# Patient Record
Sex: Male | Born: 1945 | Race: Black or African American | Hispanic: No | Smoking: Former smoker
Health system: Southern US, Community
[De-identification: ages and names within clinical notes are randomized; demographics above are authoritative.]

## PROBLEM LIST (undated history)

## (undated) DIAGNOSIS — C9000 Multiple myeloma not having achieved remission: Secondary | ICD-10-CM

## (undated) DIAGNOSIS — E119 Type 2 diabetes mellitus without complications: Secondary | ICD-10-CM

## (undated) DIAGNOSIS — E213 Hyperparathyroidism, unspecified: Secondary | ICD-10-CM

## (undated) DIAGNOSIS — K275 Chronic or unspecified peptic ulcer, site unspecified, with perforation: Secondary | ICD-10-CM

## (undated) DIAGNOSIS — Z992 Dependence on renal dialysis: Secondary | ICD-10-CM

## (undated) DIAGNOSIS — I509 Heart failure, unspecified: Secondary | ICD-10-CM

## (undated) DIAGNOSIS — I714 Abdominal aortic aneurysm, without rupture, unspecified: Secondary | ICD-10-CM

## (undated) DIAGNOSIS — I10 Essential (primary) hypertension: Secondary | ICD-10-CM

## (undated) DIAGNOSIS — I739 Peripheral vascular disease, unspecified: Secondary | ICD-10-CM

## (undated) HISTORY — PX: OTHER SURGICAL HISTORY: SHX169

## (undated) HISTORY — PX: PACEMAKER PLACEMENT: SHX43

## (undated) HISTORY — PX: BELOW KNEE LEG AMPUTATION: SUR23

## (undated) HISTORY — PX: AV FISTULA PLACEMENT: SHX1204

---

## 2015-11-25 ENCOUNTER — Other Ambulatory Visit
Admission: RE | Admit: 2015-11-25 | Discharge: 2015-11-25 | Disposition: A | Discharge status: Home / Self Care | Payer: Medicare Other | Source: Other Acute Inpatient Hospital | Attending: Family Medicine | Admitting: Family Medicine

## 2015-11-25 DIAGNOSIS — E785 Hyperlipidemia, unspecified: Secondary | ICD-10-CM | POA: Diagnosis not present

## 2015-11-25 DIAGNOSIS — E119 Type 2 diabetes mellitus without complications: Secondary | ICD-10-CM | POA: Insufficient documentation

## 2015-11-25 DIAGNOSIS — R0602 Shortness of breath: Secondary | ICD-10-CM | POA: Diagnosis not present

## 2015-11-25 DIAGNOSIS — N186 End stage renal disease: Secondary | ICD-10-CM | POA: Insufficient documentation

## 2015-11-25 DIAGNOSIS — I714 Abdominal aortic aneurysm, without rupture: Secondary | ICD-10-CM | POA: Insufficient documentation

## 2015-11-25 DIAGNOSIS — K271 Acute peptic ulcer, site unspecified, with perforation: Secondary | ICD-10-CM | POA: Diagnosis present

## 2015-11-25 DIAGNOSIS — I10 Essential (primary) hypertension: Secondary | ICD-10-CM | POA: Insufficient documentation

## 2015-11-25 LAB — CBC WITH DIFFERENTIAL/PLATELET
BASOS ABS: 0 10*3/uL (ref 0–0.1)
BASOS PCT: 1 %
EOS ABS: 0 10*3/uL (ref 0–0.7)
Eosinophils Relative: 0 %
HEMATOCRIT: 24.7 % — AB (ref 40.0–52.0)
HEMOGLOBIN: 8 g/dL — AB (ref 13.0–18.0)
Lymphocytes Relative: 10 %
Lymphs Abs: 0.9 10*3/uL — ABNORMAL LOW (ref 1.0–3.6)
MCH: 26 pg (ref 26.0–34.0)
MCHC: 32.6 g/dL (ref 32.0–36.0)
MCV: 79.9 fL — ABNORMAL LOW (ref 80.0–100.0)
MONOS PCT: 5 %
Monocytes Absolute: 0.4 10*3/uL (ref 0.2–1.0)
NEUTROS ABS: 7.9 10*3/uL — AB (ref 1.4–6.5)
NEUTROS PCT: 84 %
Platelets: 133 10*3/uL — ABNORMAL LOW (ref 150–440)
RBC: 3.08 MIL/uL — AB (ref 4.40–5.90)
RDW: 22.4 % — ABNORMAL HIGH (ref 11.5–14.5)
WBC: 9.3 10*3/uL (ref 3.8–10.6)

## 2015-11-25 LAB — BASIC METABOLIC PANEL
ANION GAP: 12 (ref 5–15)
BUN: 38 mg/dL — ABNORMAL HIGH (ref 6–20)
CHLORIDE: 93 mmol/L — AB (ref 101–111)
CO2: 28 mmol/L (ref 22–32)
CREATININE: 3.54 mg/dL — AB (ref 0.61–1.24)
Calcium: 10 mg/dL (ref 8.9–10.3)
GFR calc non Af Amer: 16 mL/min — ABNORMAL LOW (ref >60)
GFR, EST AFRICAN AMERICAN: 18 mL/min — AB (ref >60)
Glucose, Bld: 181 mg/dL — ABNORMAL HIGH (ref 65–99)
POTASSIUM: 4.4 mmol/L (ref 3.5–5.1)
SODIUM: 133 mmol/L — AB (ref 135–145)

## 2015-11-27 ENCOUNTER — Emergency Department: Payer: Self-pay

## 2015-11-27 ENCOUNTER — Encounter: Payer: Self-pay | Admitting: Emergency Medicine

## 2015-11-27 ENCOUNTER — Emergency Department
Admission: EM | Admit: 2015-11-27 | Discharge: 2015-11-27 | Disposition: A | Discharge status: Home / Self Care | Payer: Self-pay | Attending: Emergency Medicine | Admitting: Emergency Medicine

## 2015-11-27 DIAGNOSIS — Z87891 Personal history of nicotine dependence: Secondary | ICD-10-CM | POA: Insufficient documentation

## 2015-11-27 DIAGNOSIS — Z139 Encounter for screening, unspecified: Secondary | ICD-10-CM

## 2015-11-27 DIAGNOSIS — I509 Heart failure, unspecified: Secondary | ICD-10-CM | POA: Insufficient documentation

## 2015-11-27 DIAGNOSIS — I132 Hypertensive heart and chronic kidney disease with heart failure and with stage 5 chronic kidney disease, or end stage renal disease: Secondary | ICD-10-CM | POA: Insufficient documentation

## 2015-11-27 DIAGNOSIS — N186 End stage renal disease: Secondary | ICD-10-CM | POA: Insufficient documentation

## 2015-11-27 DIAGNOSIS — E119 Type 2 diabetes mellitus without complications: Secondary | ICD-10-CM | POA: Insufficient documentation

## 2015-11-27 DIAGNOSIS — Z992 Dependence on renal dialysis: Secondary | ICD-10-CM | POA: Insufficient documentation

## 2015-11-27 DIAGNOSIS — Z0000 Encounter for general adult medical examination without abnormal findings: Secondary | ICD-10-CM | POA: Insufficient documentation

## 2015-11-27 HISTORY — DX: Essential (primary) hypertension: I10

## 2015-11-27 LAB — BLOOD GAS, ARTERIAL
ACID-BASE EXCESS: 7.5 mmol/L — AB (ref 0.0–2.0)
Bicarbonate: 32 mmol/L — ABNORMAL HIGH (ref 20.0–28.0)
FIO2: 0.21
O2 SAT: 91.5 %
PH ART: 7.47 — AB (ref 7.350–7.450)
Patient temperature: 37
pCO2 arterial: 44 mmHg (ref 32.0–48.0)
pO2, Arterial: 58 mmHg — ABNORMAL LOW (ref 83.0–108.0)

## 2015-11-27 NOTE — ED Notes (Signed)
Pt has been on RA since arrival to ED. Although pt is chronically on 2L at facility, pt has remained with O2 saturation from 97-100% on RA.

## 2015-11-27 NOTE — Discharge Instructions (Signed)
Wear your oxygen as instructed. Return to the ER for worsening symptoms, persistent vomiting, difficulty breathing or other concerns.

## 2015-11-27 NOTE — ED Triage Notes (Signed)
Per [facility] pt was found by nursing home staff reaching up to the ceiling on 10L O2 and saturation was 70%. [facility] staff was unable to explain why pt was on 10L or whether he was on a mask or nasal cannula. Per EMS pt's O2 saturation was 98% on RA. EMS also reports that staff at [facility] was unable to provide any report on pt's normal behavior. Pt is answering questions at this time.

## 2015-11-27 NOTE — ED Notes (Signed)
Per Jackelyn Poling at [facility], she noticed pt talking to someone when she made her rounds. When she checked pt's O2 she saw 77% on pt chronic 2L O2. Debbie's supervisor then put Mr Screws on a non-rebreather before EMS arrived. Pt has no hx of dementia.

## 2015-11-27 NOTE — ED Provider Notes (Signed)
[facility] Emergency Department Provider Note   ____________________________________________   First MD Initiated Contact with Patient 11/27/15 [phone]     (approximate)  I have reviewed the triage vital signs and the nursing notes.   HISTORY  Chief Complaint Shortness of Breath    HPI Gregory Livingston is a 70 y.o. male sent to the ED from [place] with a chief complaint of hypoxia. Patient has a history of end-stage renal disease who receives dialysis yesterday. He wears 2 L continuous oxygen. On nursing rounds, he was foundto be talking to someone; reportedly a nurse who had not taking care of him before checked his saturations and found them to be saturations were 77%. It is unclear whether this was checked on room air or patient's 2 L oxygen. Evidently staff members place patient on nonrebreather prior to EMS arrival. EMS reports room air saturations 98% upon their arrival. Patient voices no complaints. Denies fever, chills, chest pain, shortness of breath, abdominal pain, nausea, vomiting, diarrhea. Denies recent travel or trauma. Nothing makes his symptoms better or worse.   Past Medical History:  Diagnosis Date  . Hypertension   ESRD on HD T/Th/Sat DM AAA w/o rupture  There are no active problems to display for this patient.   Past Surgical History:  Procedure Laterality Date  . BELOW KNEE LEG AMPUTATION Bilateral   . PACEMAKER PLACEMENT      Prior to Admission medications   Not on File    Allergies Ivp dye [iodinated diagnostic agents]  No family history on file.  Social History Social History  Substance Use Topics  . Smoking status: Former Research scientist (life sciences)  . Smokeless tobacco: Never Used  . Alcohol use Yes    Review of Systems  Constitutional: No fever/chills. Eyes: No visual changes. ENT: No sore throat. Cardiovascular: Denies chest pain. Respiratory: Denies shortness of breath. Gastrointestinal: No abdominal  pain.  No nausea, no vomiting.  No diarrhea.  No constipation. Genitourinary: Negative for dysuria. Musculoskeletal: Negative for back pain. Skin: Negative for rash. Neurological: Negative for headaches, focal weakness or numbness.  10-point ROS otherwise negative.  ____________________________________________   PHYSICAL EXAM:  VITAL SIGNS: ED Triage Vitals  Enc Vitals Group     BP 11/27/15 0423 (!) 115/58     Pulse Rate 11/27/15 0423 69     Resp --      Temp 11/27/15 0423 98.7 F (37.1 C)     Temp Source 11/27/15 0423 Oral     SpO2 11/27/15 0423 100 %     Weight 11/27/15 0427 200 lb (90.7 kg)     Height 11/27/15 0427 5' (1.524 m)     Head Circumference --      Peak Flow --      Pain Score 11/27/15 0428 0     Pain Loc --      Pain Edu? --      Excl. in [place]? --     Constitutional: Alert and oriented. Well appearing and in no acute distress. Eyes: Conjunctivae are normal. PERRL. EOMI. Head: Atraumatic. Nose: No congestion/rhinnorhea. Mouth/Throat: Mucous membranes are moist.  Oropharynx non-erythematous. Neck: No stridor.   Cardiovascular: Normal rate, regular rhythm. Grossly normal heart sounds.  Good peripheral circulation. Respiratory: Normal respiratory effort.  No retractions. Lungs CTAB. Gastrointestinal: Soft and nontender. No distention. No abdominal bruits. No CVA tenderness. Musculoskeletal: BLE BKAs Neurologic:  Alert and oriented to person and place. Normal speech and language. No gross focal neurologic deficits  are appreciated.  Skin:  Skin is warm, dry and intact. No rash noted. Psychiatric: Mood and affect are normal. Speech and behavior are normal.  ____________________________________________   LABS (all labs ordered are listed, but only abnormal results are displayed)  Labs Reviewed  BLOOD GAS, ARTERIAL - Abnormal; Notable for the following:       Result Value   pH, Arterial 7.47 (*)    pO2, Arterial 58 (*)    Bicarbonate 32.0 (*)    Acid-Base  Excess 7.5 (*)    All other components within normal limits   ____________________________________________  EKG  ED ECG REPORT I, Girtie Wiersma J, the attending physician, personally viewed and interpreted this ECG.   Date: 11/27/2015  EKG Time: 0422  Rate: 70  Rhythm: normal EKG, normal sinus rhythm  Axis: Within normal limits  Intervals:left bundle branch block  ST&T Change: Nonspecific  ____________________________________________  RADIOLOGY  Portable chest xray (viewed by me, interpreted per Dr. Gerilyn Nestle):  Shallow inspiration. No evidence of active pulmonary disease.  ____________________________________________   PROCEDURES  Procedure(s) performed: None  Procedures  Critical Care performed: No  ____________________________________________   INITIAL IMPRESSION / ASSESSMENT AND PLAN / ED COURSE  Pertinent labs & imaging results that were available during my care of the patient were reviewed by me and considered in my medical decision making (see chart for details).  70 year old male sent to the ED from SNF for hypoxia. ABG on room air and chest x-ray unremarkable.  Clinical Course as of Nov 26 649  Nancy Fetter Nov 27, 2015  [id number] Patient has been observed in the emergency department for 2.5 hours; he has not been hypoxic, even on room air. ABG and chest x-ray are unremarkable. Patient voices no complaints. Will discharge back to nursing home. Strict return precautions given. Patient verbalizes understanding and agrees with plan of care.  [JS]    Clinical Course User Index [JS] Paulette Blanch, MD     ____________________________________________   FINAL CLINICAL IMPRESSION(S) / ED DIAGNOSES  Final diagnoses:  Encounter for medical screening examination  ESRD (end stage renal disease) on dialysis [facility])      NEW MEDICATIONS STARTED DURING THIS VISIT:  New Prescriptions   No medications on file     Note:  This document was prepared using Dragon voice  recognition software and may include unintentional dictation errors.    Paulette Blanch, MD 11/27/15 [phone]

## 2015-11-27 NOTE — ED Notes (Signed)
Pt unable to sign. [facility] called and notified on pts discharge.

## 2015-11-28 ENCOUNTER — Emergency Department: Payer: Medicare Other

## 2015-11-28 ENCOUNTER — Encounter: Payer: Self-pay | Admitting: Emergency Medicine

## 2015-11-28 ENCOUNTER — Inpatient Hospital Stay
Admission: EM | Admit: 2015-11-28 | Discharge: 2015-12-02 | DRG: 314 | Disposition: A | Discharge status: Skilled Nursing Facility | Payer: Medicare Other | Attending: Internal Medicine | Admitting: Internal Medicine

## 2015-11-28 DIAGNOSIS — L8990 Pressure ulcer of unspecified site, unspecified stage: Secondary | ICD-10-CM | POA: Insufficient documentation

## 2015-11-28 DIAGNOSIS — Z8711 Personal history of peptic ulcer disease: Secondary | ICD-10-CM | POA: Diagnosis not present

## 2015-11-28 DIAGNOSIS — E1151 Type 2 diabetes mellitus with diabetic peripheral angiopathy without gangrene: Secondary | ICD-10-CM | POA: Diagnosis present

## 2015-11-28 DIAGNOSIS — E1122 Type 2 diabetes mellitus with diabetic chronic kidney disease: Secondary | ICD-10-CM | POA: Diagnosis present

## 2015-11-28 DIAGNOSIS — Z89512 Acquired absence of left leg below knee: Secondary | ICD-10-CM

## 2015-11-28 DIAGNOSIS — I5022 Chronic systolic (congestive) heart failure: Secondary | ICD-10-CM | POA: Diagnosis present

## 2015-11-28 DIAGNOSIS — Z87891 Personal history of nicotine dependence: Secondary | ICD-10-CM

## 2015-11-28 DIAGNOSIS — Z950 Presence of cardiac pacemaker: Secondary | ICD-10-CM

## 2015-11-28 DIAGNOSIS — D649 Anemia, unspecified: Secondary | ICD-10-CM

## 2015-11-28 DIAGNOSIS — I132 Hypertensive heart and chronic kidney disease with heart failure and with stage 5 chronic kidney disease, or end stage renal disease: Secondary | ICD-10-CM | POA: Diagnosis present

## 2015-11-28 DIAGNOSIS — Z7902 Long term (current) use of antithrombotics/antiplatelets: Secondary | ICD-10-CM | POA: Diagnosis not present

## 2015-11-28 DIAGNOSIS — R531 Weakness: Secondary | ICD-10-CM | POA: Diagnosis present

## 2015-11-28 DIAGNOSIS — G9340 Encephalopathy, unspecified: Secondary | ICD-10-CM | POA: Diagnosis present

## 2015-11-28 DIAGNOSIS — R4182 Altered mental status, unspecified: Secondary | ICD-10-CM

## 2015-11-28 DIAGNOSIS — N186 End stage renal disease: Secondary | ICD-10-CM | POA: Diagnosis present

## 2015-11-28 DIAGNOSIS — Z7982 Long term (current) use of aspirin: Secondary | ICD-10-CM

## 2015-11-28 DIAGNOSIS — E876 Hypokalemia: Secondary | ICD-10-CM | POA: Diagnosis present

## 2015-11-28 DIAGNOSIS — Z8579 Personal history of other malignant neoplasms of lymphoid, hematopoietic and related tissues: Secondary | ICD-10-CM

## 2015-11-28 DIAGNOSIS — Z89511 Acquired absence of right leg below knee: Secondary | ICD-10-CM

## 2015-11-28 DIAGNOSIS — D509 Iron deficiency anemia, unspecified: Secondary | ICD-10-CM | POA: Diagnosis present

## 2015-11-28 DIAGNOSIS — I959 Hypotension, unspecified: Principal | ICD-10-CM | POA: Diagnosis present

## 2015-11-28 DIAGNOSIS — Z9981 Dependence on supplemental oxygen: Secondary | ICD-10-CM

## 2015-11-28 DIAGNOSIS — I714 Abdominal aortic aneurysm, without rupture: Secondary | ICD-10-CM | POA: Diagnosis present

## 2015-11-28 DIAGNOSIS — Z79899 Other long term (current) drug therapy: Secondary | ICD-10-CM | POA: Diagnosis not present

## 2015-11-28 DIAGNOSIS — N2581 Secondary hyperparathyroidism of renal origin: Secondary | ICD-10-CM | POA: Diagnosis present

## 2015-11-28 DIAGNOSIS — Z992 Dependence on renal dialysis: Secondary | ICD-10-CM | POA: Diagnosis not present

## 2015-11-28 DIAGNOSIS — Z8249 Family history of ischemic heart disease and other diseases of the circulatory system: Secondary | ICD-10-CM

## 2015-11-28 DIAGNOSIS — L89152 Pressure ulcer of sacral region, stage 2: Secondary | ICD-10-CM | POA: Diagnosis present

## 2015-11-28 HISTORY — DX: Multiple myeloma not having achieved remission: C90.00

## 2015-11-28 HISTORY — DX: Abdominal aortic aneurysm, without rupture, unspecified: I71.40

## 2015-11-28 HISTORY — DX: Abdominal aortic aneurysm, without rupture: I71.4

## 2015-11-28 HISTORY — DX: Peripheral vascular disease, unspecified: I73.9

## 2015-11-28 HISTORY — DX: Hyperparathyroidism, unspecified: E21.3

## 2015-11-28 HISTORY — DX: Heart failure, unspecified: I50.9

## 2015-11-28 HISTORY — DX: Chronic or unspecified peptic ulcer, site unspecified, with perforation: K27.5

## 2015-11-28 HISTORY — DX: Dependence on renal dialysis: Z99.2

## 2015-11-28 HISTORY — DX: Type 2 diabetes mellitus without complications: E11.9

## 2015-11-28 LAB — CBC WITH DIFFERENTIAL/PLATELET
Basophils Absolute: 0 10*3/uL (ref 0–0.1)
Basophils Relative: 1 %
Eosinophils Absolute: 0.1 10*3/uL (ref 0–0.7)
Eosinophils Relative: 2 %
HCT: 22.6 % — ABNORMAL LOW (ref 40.0–52.0)
HEMOGLOBIN: 7.1 g/dL — AB (ref 13.0–18.0)
LYMPHS ABS: 0.9 10*3/uL — AB (ref 1.0–3.6)
LYMPHS PCT: 18 %
MCH: 24.9 pg — AB (ref 26.0–34.0)
MCHC: 31.3 g/dL — ABNORMAL LOW (ref 32.0–36.0)
MCV: 79.6 fL — AB (ref 80.0–100.0)
Monocytes Absolute: 0.5 10*3/uL (ref 0.2–1.0)
Monocytes Relative: 10 %
NEUTROS PCT: 69 %
Neutro Abs: 3.5 10*3/uL (ref 1.4–6.5)
Platelets: 135 10*3/uL — ABNORMAL LOW (ref 150–440)
RBC: 2.84 MIL/uL — AB (ref 4.40–5.90)
RDW: 22.1 % — ABNORMAL HIGH (ref 11.5–14.5)
WBC: 5 10*3/uL (ref 3.8–10.6)

## 2015-11-28 LAB — COMPREHENSIVE METABOLIC PANEL
ALT: 18 U/L (ref 17–63)
AST: 40 U/L (ref 15–41)
Albumin: 2.1 g/dL — ABNORMAL LOW (ref 3.5–5.0)
Alkaline Phosphatase: 48 U/L (ref 38–126)
Anion gap: 10 (ref 5–15)
BUN: 33 mg/dL — AB (ref 6–20)
CHLORIDE: 94 mmol/L — AB (ref 101–111)
CO2: 28 mmol/L (ref 22–32)
Calcium: 9.7 mg/dL (ref 8.9–10.3)
Creatinine, Ser: 3.65 mg/dL — ABNORMAL HIGH (ref 0.61–1.24)
GFR calc Af Amer: 18 mL/min — ABNORMAL LOW (ref >60)
GFR, EST NON AFRICAN AMERICAN: 15 mL/min — AB (ref >60)
Glucose, Bld: 140 mg/dL — ABNORMAL HIGH (ref 65–99)
POTASSIUM: 4.3 mmol/L (ref 3.5–5.1)
SODIUM: 132 mmol/L — AB (ref 135–145)
Total Bilirubin: 1.1 mg/dL (ref 0.3–1.2)
Total Protein: 4.9 g/dL — ABNORMAL LOW (ref 6.5–8.1)

## 2015-11-28 LAB — BLOOD GAS, VENOUS
Acid-Base Excess: 7.5 mmol/L — ABNORMAL HIGH (ref 0.0–2.0)
BICARBONATE: 33 mmol/L — AB (ref 20.0–28.0)
O2 Saturation: 78 %
PH VEN: 7.41 (ref 7.250–7.430)
PO2 VEN: 42 mmHg (ref 32.0–45.0)
Patient temperature: 37
pCO2, Ven: 52 mmHg (ref 44.0–60.0)

## 2015-11-28 LAB — CREATININE, SERUM
CREATININE: 3.68 mg/dL — AB (ref 0.61–1.24)
GFR calc Af Amer: 18 mL/min — ABNORMAL LOW (ref >60)
GFR calc non Af Amer: 15 mL/min — ABNORMAL LOW (ref >60)

## 2015-11-28 LAB — TROPONIN I: TROPONIN I: 0.09 ng/mL — AB (ref <0.03)

## 2015-11-28 LAB — CBC
HEMATOCRIT: 22.4 % — AB (ref 40.0–52.0)
Hemoglobin: 7.2 g/dL — ABNORMAL LOW (ref 13.0–18.0)
MCH: 25.6 pg — ABNORMAL LOW (ref 26.0–34.0)
MCHC: 32.1 g/dL (ref 32.0–36.0)
MCV: 79.8 fL — AB (ref 80.0–100.0)
PLATELETS: 115 10*3/uL — AB (ref 150–440)
RBC: 2.81 MIL/uL — AB (ref 4.40–5.90)
RDW: 21.8 % — ABNORMAL HIGH (ref 11.5–14.5)
WBC: 5.2 10*3/uL (ref 3.8–10.6)

## 2015-11-28 LAB — PROTIME-INR
INR: 0.97
PROTHROMBIN TIME: 12.9 s (ref 11.4–15.2)

## 2015-11-28 LAB — AMMONIA: Ammonia: 19 umol/L (ref 9–35)

## 2015-11-28 LAB — LIPASE, BLOOD: Lipase: 15 U/L (ref 11–51)

## 2015-11-28 LAB — LACTIC ACID, PLASMA
Lactic Acid, Venous: 1.1 mmol/L (ref 0.5–1.9)
Lactic Acid, Venous: 0.9 mmol/L (ref 0.5–1.9)

## 2015-11-28 LAB — GLUCOSE, CAPILLARY: GLUCOSE-CAPILLARY: 137 mg/dL — AB (ref 65–99)

## 2015-11-28 LAB — TYPE AND SCREEN
ABO/RH(D): O POS
ANTIBODY SCREEN: NEGATIVE

## 2015-11-28 LAB — APTT: aPTT: 33 seconds (ref 24–36)

## 2015-11-28 MED ORDER — POLYVINYL ALCOHOL 1.4 % OP SOLN
1.0000 [drp] | Freq: Four times a day (QID) | OPHTHALMIC | Status: DC | PRN
  Filled 2015-11-28: qty 15

## 2015-11-28 MED ORDER — DIFLUPREDNATE 0.05 % OP EMUL: 1.0000 [drp] | Freq: Every day | OPHTHALMIC | Status: DC

## 2015-11-28 MED ORDER — CINACALCET HCL 30 MG PO TABS
120.0000 mg | ORAL_TABLET | Freq: Every day | ORAL | Status: DC
Start: 2015-11-28 — End: 2015-12-02
  Administered 2015-11-28 – 2015-12-01 (×4): 120 mg via ORAL
  Filled 2015-11-28 (×5): qty 4

## 2015-11-28 MED ORDER — PREDNISOLONE ACETATE 1 % OP SUSP
1.0000 [drp] | Freq: Two times a day (BID) | OPHTHALMIC | Status: DC
  Administered 2015-11-29 – 2015-12-02 (×7): 1 [drp] via OPHTHALMIC
  Filled 2015-11-28: qty 1

## 2015-11-28 MED ORDER — SODIUM CHLORIDE 0.9% FLUSH
3.0000 mL | Freq: Two times a day (BID) | INTRAVENOUS | Status: DC
  Administered 2015-11-28 – 2015-12-02 (×10): 3 mL via INTRAVENOUS

## 2015-11-28 MED ORDER — OXYCODONE HCL 5 MG PO TABS
5.0000 mg | ORAL_TABLET | ORAL | Status: DC | PRN
Start: 2015-11-28 — End: 2015-12-02
  Administered 2015-11-29: 5 mg via ORAL
  Filled 2015-11-28: qty 1

## 2015-11-28 MED ORDER — RANOLAZINE ER 500 MG PO TB12
500.0000 mg | ORAL_TABLET | Freq: Two times a day (BID) | ORAL | Status: DC
  Administered 2015-11-29 – 2015-12-02 (×6): 500 mg via ORAL
  Filled 2015-11-28 (×7): qty 1

## 2015-11-28 MED ORDER — ONDANSETRON HCL 4 MG/2ML IJ SOLN
4.0000 mg | Freq: Four times a day (QID) | INTRAMUSCULAR | Status: DC | PRN
  Administered 2015-12-01: 4 mg via INTRAVENOUS
  Filled 2015-11-28: qty 2

## 2015-11-28 MED ORDER — ONDANSETRON HCL 4 MG PO TABS: 4.0000 mg | ORAL_TABLET | Freq: Four times a day (QID) | ORAL | Status: DC | PRN

## 2015-11-28 MED ORDER — ALBUTEROL SULFATE (2.5 MG/3ML) 0.083% IN NEBU: 2.5000 mg | INHALATION_SOLUTION | Freq: Four times a day (QID) | RESPIRATORY_TRACT | Status: DC | PRN

## 2015-11-28 MED ORDER — ACETAMINOPHEN 325 MG PO TABS: 650.0000 mg | ORAL_TABLET | Freq: Four times a day (QID) | ORAL | Status: DC | PRN

## 2015-11-28 MED ORDER — HEPARIN SODIUM (PORCINE) 5000 UNIT/ML IJ SOLN
5000.0000 [IU] | Freq: Three times a day (TID) | INTRAMUSCULAR | Status: DC
  Administered 2015-11-28 – 2015-11-29 (×2): 5000 [IU] via SUBCUTANEOUS
  Filled 2015-11-28 (×5): qty 1

## 2015-11-28 MED ORDER — ACETAMINOPHEN 650 MG RE SUPP: 650.0000 mg | Freq: Four times a day (QID) | RECTAL | Status: DC | PRN

## 2015-11-28 MED ORDER — BRIMONIDINE TARTRATE 0.2 % OP SOLN
1.0000 [drp] | Freq: Three times a day (TID) | OPHTHALMIC | Status: DC
  Administered 2015-11-29 – 2015-12-02 (×10): 1 [drp] via OPHTHALMIC
  Filled 2015-11-28: qty 5

## 2015-11-28 MED ORDER — NEPRO/CARBSTEADY PO LIQD
237.0000 mL | Freq: Three times a day (TID) | ORAL | Status: DC
  Administered 2015-11-29 – 2015-12-02 (×6): 237 mL via ORAL

## 2015-11-28 MED ORDER — SODIUM CHLORIDE 0.9 % IV BOLUS (SEPSIS)
500.0000 mL | Freq: Once | INTRAVENOUS | Status: AC
  Administered 2015-11-28: 500 mL via INTRAVENOUS

## 2015-11-28 MED ORDER — SEVELAMER CARBONATE 800 MG PO TABS
2400.0000 mg | ORAL_TABLET | Freq: Three times a day (TID) | ORAL | Status: DC
Start: 2015-11-29 — End: 2015-12-02
  Administered 2015-11-29 – 2015-12-02 (×8): 2400 mg via ORAL
  Filled 2015-11-28 (×10): qty 3

## 2015-11-28 MED ORDER — CINACALCET HCL 30 MG PO TABS: 120.0000 mg | ORAL_TABLET | Freq: Every day | ORAL | Status: DC

## 2015-11-28 MED ORDER — SENNA 8.6 MG PO TABS
2.0000 | ORAL_TABLET | Freq: Every day | ORAL | Status: DC
  Administered 2015-11-30: 8.6 mg via ORAL
  Administered 2015-12-01 – 2015-12-02 (×2): 17.2 mg via ORAL
  Filled 2015-11-28 (×4): qty 2

## 2015-11-28 MED ORDER — PANTOPRAZOLE SODIUM 40 MG PO TBEC
40.0000 mg | DELAYED_RELEASE_TABLET | Freq: Two times a day (BID) | ORAL | Status: DC
  Administered 2015-11-29 – 2015-12-02 (×7): 40 mg via ORAL
  Filled 2015-11-28 (×7): qty 1

## 2015-11-28 MED ORDER — ASPIRIN 81 MG PO CHEW
81.0000 mg | CHEWABLE_TABLET | Freq: Every day | ORAL | Status: DC
  Administered 2015-11-29 – 2015-12-02 (×4): 81 mg via ORAL
  Filled 2015-11-28 (×4): qty 1

## 2015-11-28 MED ORDER — ATORVASTATIN CALCIUM 20 MG PO TABS
40.0000 mg | ORAL_TABLET | Freq: Every day | ORAL | Status: DC
  Administered 2015-11-28 – 2015-12-01 (×4): 40 mg via ORAL
  Filled 2015-11-28 (×4): qty 2

## 2015-11-28 MED ORDER — CLOPIDOGREL BISULFATE 75 MG PO TABS
75.0000 mg | ORAL_TABLET | Freq: Every day | ORAL | Status: DC
  Administered 2015-11-29 – 2015-12-02 (×4): 75 mg via ORAL
  Filled 2015-11-28 (×4): qty 1

## 2015-11-28 MED ORDER — FOLIC ACID 1 MG PO TABS
2.0000 mg | ORAL_TABLET | Freq: Every day | ORAL | Status: DC
  Administered 2015-11-29 – 2015-12-02 (×4): 2 mg via ORAL
  Filled 2015-11-28 (×5): qty 2

## 2015-11-28 NOTE — ED Triage Notes (Signed)
Pt from [facility]. Got dialysis last Saturday, normally mon, wed, fri pt.sent today for AMS and has been sent to an ED x 2 over last week.  Weakness on right side per nursing facility. Hypotensive.

## 2015-11-28 NOTE — ED Provider Notes (Signed)
-----------------------------------------   4:16 PM on 11/28/2015 -----------------------------------------  Signed out to to me at this time. Pt was refused by Dr. Jerry Caras of the [facility] as they are on diversion. Per dr. Tamala Fothergill plan, pt to be admitted to the hospitalist. He is guaiac negative from below and per family his mentation is back to baseline.    Schuyler Amor, MD 11/28/15 [phone]

## 2015-11-28 NOTE — H&P (Signed)
[facility] [place] NAME: Gregory Livingston    MR#:  [id number]  DATE OF BIRTH:  Aug 30, 1945   DATE OF ADMISSION:  11/28/2015  PRIMARY CARE PHYSICIAN: No PCP Per Patient   REQUESTING/REFERRING PHYSICIAN: McShane  CHIEF COMPLAINT:   Chief Complaint  Patient presents with  . Altered Mental Status    HISTORY OF PRESENT ILLNESS:  Gregory Livingston  is a 70 y.o. male with a known history of End-stage renal disease on hemodialysis Monday Wednesday Friday and she received last dialysis on Saturday, missed dialysis today present to the hospital for altered mental status. Patient is accompanied by multiple family members at bedside. Per the daughter patient has his normal speech pattern and level of mental status. States he is not sure why he is in the hospital  Of note recently brought to the hospital yesterday for shortness of breath however when the patient arrived he was not short of breath and was on his baseline oxygen level was sent back to the nursing facility.  He was noted to have hypotension today at his nursing facility thus sent to Hospital further workup and evaluation.  PAST MEDICAL HISTORY:   Past Medical History:  Diagnosis Date  . AAA (abdominal aortic aneurysm) ([place])   . CHF (congestive heart failure) ([place])   . Diabetes mellitus without complication ([place])   . Dialysis patient ([place])   . Hyperparathyroidism ([place])   . Hypertension   . Multiple myeloma ([place])   . Peptic ulcer with perforation ([place])   . PVD (peripheral vascular disease) ([place])     PAST SURGICAL HISTORY:   Past Surgical History:  Procedure Laterality Date  . AV FISTULA PLACEMENT    . BELOW KNEE LEG AMPUTATION Bilateral   . bilateral below knee amputation    . PACEMAKER PLACEMENT      SOCIAL HISTORY:   Social History  Substance Use Topics  . Smoking status: Former Research scientist (life sciences)  . Smokeless tobacco: Never Used  . Alcohol use Yes    FAMILY HISTORY:   Family  History  Problem Relation Age of Onset  . Hypertension Other     DRUG ALLERGIES:   Allergies  Allergen Reactions  . Ivp Dye [Iodinated Diagnostic Agents]     REVIEW OF SYSTEMS:  REVIEW OF SYSTEMS:  CONSTITUTIONAL: Denies fevers, chills, fatigue, weakness.  EYES: Denies blurred vision, double vision, or eye pain.  EARS, NOSE, THROAT: Denies tinnitus, ear pain, hearing loss.  RESPIRATORY: denies cough, shortness of breath, wheezing  CARDIOVASCULAR: Denies chest pain, palpitations, edema.  GASTROINTESTINAL: Denies nausea, vomiting, diarrhea, abdominal pain.  GENITOURINARY: Denies dysuria, hematuria.  ENDOCRINE: Denies nocturia or thyroid problems. HEMATOLOGIC AND LYMPHATIC: Denies easy bruising or bleeding.  SKIN: Denies rash or lesions.  MUSCULOSKELETAL: Denies pain in neck, back, shoulder, knees, hips, or further arthritic symptoms.  NEUROLOGIC: Denies paralysis, paresthesias.  PSYCHIATRIC: Denies anxiety or depressive symptoms. Otherwise full review of systems performed by me is negative.   MEDICATIONS AT HOME:   Prior to Admission medications   Medication Sig Start Date End Date Taking? Authorizing Provider  albuterol (PROVENTIL HFA;VENTOLIN HFA) 108 (90 Base) MCG/ACT inhaler Inhale 2 puffs into the lungs every 6 (six) hours as needed for shortness of breath.   Yes Historical Provider, MD  aspirin 81 MG chewable tablet Chew 81 mg by mouth daily.   Yes Historical Provider, MD  atorvastatin (LIPITOR) 40 MG tablet Take 40 mg by mouth at bedtime.   Yes Historical  Provider, MD  B Complex-C-Folic Acid (RENAL VITAMIN PO) Take 1 tablet by mouth daily.   Yes Historical Provider, MD  bacitracin 500 UNIT/GM ointment Apply 1 application topically daily. Apply small amount topically to left knee abrasion daily   Yes Historical Provider, MD  brimonidine (ALPHAGAN) 0.2 % ophthalmic solution Place 1 drop into the left eye 3 (three) times daily.   Yes Historical Provider, MD  calcium  carbonate (CALCIUM 600) 1500 (600 Ca) MG TABS tablet Take 600 mg of elemental calcium by mouth every 6 (six) hours as needed.   Yes Historical Provider, MD  carvedilol (COREG) 25 MG tablet Take 25 mg by mouth every 12 (twelve) hours.   Yes Historical Provider, MD  cinacalcet (SENSIPAR) 60 MG tablet Take 120 mg by mouth daily. Take 120 mg every evening with food.   Yes Historical Provider, MD  clopidogrel (PLAVIX) 75 MG tablet Take 75 mg by mouth daily.   Yes Historical Provider, MD  Difluprednate (DUREZOL) 0.05 % EMUL Place 1 drop into both eyes daily.   Yes Historical Provider, MD  folic acid (FOLVITE) 1 MG tablet Take 2 mg by mouth daily.   Yes Historical Provider, MD  gabapentin (NEURONTIN) 100 MG capsule Take 200 mg by mouth at bedtime.   Yes Historical Provider, MD  hydroxypropyl methylcellulose / hypromellose (ISOPTO TEARS / GONIOVISC) 2.5 % ophthalmic solution Place 1 drop into both eyes 4 (four) times daily as needed for dry eyes.   Yes Historical Provider, MD  isosorbide mononitrate (IMDUR) 120 MG 24 hr tablet Take 240 mg by mouth daily.   Yes Historical Provider, MD  nitroGLYCERIN (NITROSTAT) 0.4 MG SL tablet Place 0.4 mg under the tongue every 5 (five) minutes as needed for chest pain.   Yes Historical Provider, MD  Nutritional Supplements (FEEDING SUPPLEMENT, NEPRO CARB STEADY,) LIQD Take 237 mLs by mouth 3 (three) times daily.   Yes Historical Provider, MD  oxyCODONE (OXY IR/ROXICODONE) 5 MG immediate release tablet Take 5 mg by mouth every 4 (four) hours as needed for severe pain.   Yes Historical Provider, MD  pantoprazole (PROTONIX) 40 MG tablet Take 40 mg by mouth 2 (two) times daily.   Yes Historical Provider, MD  ranolazine (RANEXA) 500 MG 12 hr tablet Take 500 mg by mouth 2 (two) times daily.   Yes Historical Provider, MD  senna (SENOKOT) 8.6 MG TABS tablet Take 2 tablets by mouth daily.   Yes Historical Provider, MD  sevelamer carbonate (RENVELA) 800 MG tablet Take 2,400 mg by  mouth 3 (three) times daily with meals.   Yes Historical Provider, MD      VITAL SIGNS:  Blood pressure (!) 95/50, pulse 62, temperature 97.5 F (36.4 C), temperature source Oral, resp. rate 17, weight 86.2 kg (190 lb), SpO2 100 %.  PHYSICAL EXAMINATION:  VITAL SIGNS: Vitals:   11/28/15 1600 11/28/15 1630  BP: (!) 92/48 (!) 95/50  Pulse:  62  Resp: 14 17  Temp:     GENERAL:70 y.o.male currently in no acute distress.  HEAD: Normocephalic, atraumatic.  EYES: Pupils equal, round, reactive to light. Extraocular muscles intact. No scleral icterus.  MOUTH: Moist mucosal membrane. Dentition intact. No abscess noted.  EAR, NOSE, THROAT: Clear without exudates. No external lesions.  NECK: Supple. No thyromegaly. No nodules. No JVD.  PULMONARY: Diminished without wheeze rails or rhonci. No use of accessory muscles, Good respiratory effort. good air entry bilaterally CHEST: Nontender to palpation.  CARDIOVASCULAR: S1 and S2. Regular rate and rhythm.  2/6 systolic ejection murmur No edema. GASTROINTESTINAL: Soft, nontender, nondistended. No masses. Positive bowel sounds. No hepatosplenomegaly.  MUSCULOSKELETAL: No swelling, clubbing, or edema. Range of motion full in all extremities. Bilateral amputee  NEUROLOGIC: Cranial nerves II through XII are intact. No gross focal neurological deficits. Sensation intact. Reflexes intact.  SKIN: No ulceration, lesions, rashes, or cyanosis. Skin warm and dry. Turgor intact.  PSYCHIATRIC: Mood, affect within normal limits. The patient is awake, alert and oriented x 3. With some slurred speech Insight, judgment intact.    LABORATORY PANEL:   CBC  Recent Labs Lab 11/28/15 1428  WBC 5.0  HGB 7.1*  HCT 22.6*  PLT 135*   ------------------------------------------------------------------------------------------------------------------  Chemistries   Recent Labs Lab 11/28/15 1428  NA 132*  K 4.3  CL 94*  CO2 28  GLUCOSE 140*  BUN 33*    CREATININE 3.65*  CALCIUM 9.7  AST 40  ALT 18  ALKPHOS 48  BILITOT 1.1   ------------------------------------------------------------------------------------------------------------------  Cardiac Enzymes  Recent Labs Lab 11/28/15 1428  TROPONINI 0.09*   ------------------------------------------------------------------------------------------------------------------  RADIOLOGY:  Ct Abdomen Pelvis Wo Contrast  Result Date: 11/28/2015 CLINICAL DATA:  Dialysis 2 days ago. Patient presents with acute mental status change and weakness on her right side. Hypotension. EXAM: CT ABDOMEN AND PELVIS WITHOUT CONTRAST TECHNIQUE: Multidetector CT imaging of the abdomen and pelvis was performed following the standard protocol without IV contrast. COMPARISON:  None. FINDINGS: Lower chest: Small bilateral effusions and atelectasis are identified. The distal esophagus is fluid-filled. Coronary artery calcifications are identified. Hepatobiliary: Previous cholecystectomy. The liver is unremarkable. The portal vein is not assessed without contrast. Pancreas: Unremarkable. No pancreatic ductal dilatation or surrounding inflammatory changes. Spleen: Normal in size without focal abnormality. Adrenals/Urinary Tract: Vascular calcifications are associated with the bilateral kidneys. A small nonobstructive stone is not excluded in the upper right kidney. No ureterectasis or ureteral stones. The left kidney demonstrates a nodular contour and a low-attenuation mass in the anterior upper pole as seen on coronal image 52 and axial image 38 measures up to 3.8 cm with an attenuation of less than 8 Hounsfield units consistent with a cyst. The bladder is decompressed and somewhat thick walled, possibly due to decompression. Mild increased attenuation in the fat around the bladder. Stomach/Bowel: The majority of the small bowel is in the right side of the abdomen consistent with malrotation. No evidence of obstruction. The  colon and appendix are unremarkable. Vascular/Lymphatic: Dense atherosclerotic changes seen in the abdominal aorta, iliac vessels, and femoral vessels. An infrarenal abdominal aortic aneurysm is identified measuring 5.6 cm in AP diameter. Slight increased attenuation is seen in the aneurysm sac on series 2, image 54 to the left, likely faint calcification. No adjacent stranding to suggest pending rupture. Reproductive: The patient is status post prostatectomy. Other: Free intraperitoneal air is seen in the right side of the abdomen on series 2, image 26 with a small amount of adjacent fluid. No other free air is identified. A small amount of fluid is seen anteriorly in the abdomen on series 2, image 26. No other free fluid identified. Evidence of previous midline abdominal surgery. Increased attenuation in the subcutaneous and intra-abdominal fat diffusely consist with volume overload. More focal increased attenuation seen in the fat of the anterior abdomen such as on axial image 45. Musculoskeletal: Degenerative changes most marked at L3 and L4. IMPRESSION: 1. There is a focus of free air in the anterior right abdomen with a small amount of adjacent fluid. Just to the left  and inferior to this free air is a small amount of additional fluid and fat stranding. I spoke to the emergency room physician, Dr. Mariea Clonts, who indicated the patient had abdominal surgery to repair a perforated ulcer 8 days ago. The recent surgery explains the free air, fat stranding, and small amount of free fluid. 2. Mild increased attenuation around a decompressed bladder. Recommend correlation with urinalysis to exclude a UTI. 3. Low attenuation left renal mass, consistent with a cyst. 4. Focal abdominal aortic aneurysm measuring up to 5.6 cm. 5. Atherosclerosis of the aorta and branching vessels. 6. Small bilateral pleural effusions and atelectasis. 7. The distal esophagus is fluid-filled, possibly due to reflux. Electronically Signed   By:  Dorise Bullion III M.D   On: 11/28/2015 15:30   Ct Head Wo Contrast  Result Date: 11/28/2015 CLINICAL DATA:  70 year old diabetic hypertensive male with altered mental status. Last dialysis 2 days ago. Initial encounter. EXAM: CT HEAD WITHOUT CONTRAST TECHNIQUE: Contiguous axial images were obtained from the base of the skull through the vertex without intravenous contrast. COMPARISON:  None. FINDINGS: Brain: No intracranial hemorrhage. Remote left frontal lobe infarct with encephalomalacia. Mild to moderate chronic microvascular changes without CT evidence of large acute infarct. Mild moderate global atrophy without hydrocephalus. No intracranial mass lesion noted on this unenhanced exam. Vascular: Vascular calcifications. Skull: No acute abnormality. Sinuses/Orbits: Prior orbital surgery bilaterally. There may be a radiopaque structure within the right globe precluding MR. Correlation with plain films recommended prior to MR imaging. Visualized paranasal sinuses, mastoid air cells and middle ear cavities are clear. Other: Degenerative changes C1-2 articulation with marked spinal stenosis and cord flattening. IMPRESSION: No intracranial hemorrhage. Remote left frontal lobe infarct. Mild to moderate chronic microvascular changes without CT evidence of large acute infarct. Mild to moderate global atrophy. No intracranial mass lesion noted on this unenhanced exam. Prior orbital surgery bilaterally. There may be a radiopaque structure within the right globe precluding MR. Correlation with plain films recommended prior to MR imaging. Degenerative changes C1-2 articulation with marked spinal stenosis and cord flattening. Electronically Signed   By: Genia Del M.D.   On: 11/28/2015 15:13   Dg Chest Port 1 View  Result Date: 11/28/2015 CLINICAL DATA:  Altered mental status EXAM: PORTABLE CHEST 1 VIEW COMPARISON:  01/27/2015 FINDINGS: Cardiomediastinal silhouette is stable. Dual lead cardiac pacemaker is  unchanged in position. Status post CABG. No infiltrate or pulmonary edema. IMPRESSION: No active disease. Electronically Signed   By: Lahoma Crocker M.D.   On: 11/28/2015 16:12   Dg Chest Port 1 View  Result Date: 11/27/2015 CLINICAL DATA:  Shortness of breath and low oxygen saturation. EXAM: PORTABLE CHEST 1 VIEW COMPARISON:  None. FINDINGS: Postoperative changes in the mediastinum. Cardiac pacemaker. Shallow inspiration. Mild cardiac enlargement without vascular congestion. No edema or consolidation in the lungs. No blunting of costophrenic angles. No pneumothorax. Calcification of the aorta. Vascular stent in the subclavian region on the right. IMPRESSION: Shallow inspiration.  No evidence of active pulmonary disease. Electronically Signed   By: Lucienne Capers M.D.   On: 11/27/2015 05:06    EKG:   Orders placed or performed during the hospital encounter of 11/28/15  . EKG 12-Lead  . EKG 12-Lead  . ED EKG 12-Lead  . ED EKG 12-Lead    IMPRESSION AND PLAN:   70 year old African-American gentleman history of end-stage renal disease on dialysis who is presenting with hypotension and altered mental status  1. Hypotension: Unclear etiology at this time did  have one low reading after gentle IV fluid hydration has improved we'll provide fluid as required, he is dialysis dependent unable to make urine. Hold all blood pressure medications no clear evidence of infection no clear evidence of bleeding at this time  2. Acute encephalopathy: Resolved patient at baseline per family 3. Microcytic anemia: Follow CBC transfuse hemoglobin less than 7 4. Chronic heart failure unspecified ejection fraction: Hold imdur 5. End-stage renal disease on dialysis consult nephrology for continuation of dialysis    All the records are reviewed and case discussed with ED provider. Management plans discussed with the patient, family and they are in agreement.  CODE STATUS: Full  TOTAL TIME TAKING CARE OF THIS  PATIENT: 45 minutes.    Dyanne Yorks,  Karenann Cai.D on 11/28/2015 at 4:57 PM  Between 7am to 6pm - Pager - [phone]  After 6pm: House Pager: - [phone]  [facility]  Office  [phone]  CC: Primary care physician; No PCP Per Patient

## 2015-11-28 NOTE — ED Provider Notes (Addendum)
[facility] Emergency Department Provider Note  ____________________________________________  Time seen: Approximately 3:21 PM  I have reviewed the triage vital signs and the nursing notes.   HISTORY  Chief Complaint Altered Mental Status    HPI Gregory Livingston is a 70 y.o. male with a history of recent abdominal surgery for peptic ulcer related perforation on 11/20, ESRD on HD, CHF, AAA, brought from his nursing facility for altered mental status and right handed weakness, with hypotension.The patient reports that he is not having any pain, but his exam and history are limited due to his altered mental status. I will plan to call the nursing facility for further information. On arrival to the emergency department, the patient is hypotensive to 79/57, with a heart rate in the 60s, but protecting his airway and able to answer basic questions. He is unable to tell me when he last completed dialysis   Past Medical History:  Diagnosis Date  . AAA (abdominal aortic aneurysm) ([place])   . CHF (congestive heart failure) ([place])   . Diabetes mellitus without complication ([place])   . Dialysis patient ([place])   . Hyperparathyroidism ([place])   . Hypertension   . Multiple myeloma ([place])   . Peptic ulcer with perforation ([place])   . PVD (peripheral vascular disease) ([place])     There are no active problems to display for this patient.   Past Surgical History:  Procedure Laterality Date  . AV FISTULA PLACEMENT    . BELOW KNEE LEG AMPUTATION Bilateral   . bilateral below knee amputation    . PACEMAKER PLACEMENT      Current Outpatient Rx  . Order #: [id number] Class: Historical Med  . Order #: [id number] Class: Historical Med  . Order #: [id number] Class: Historical Med  . Order #: [id number] Class: Historical Med  . Order #: [id number] Class: Historical Med  . Order #: [id number] Class: Historical Med  . Order #: [id number] Class: Historical Med  . Order #: [id number] Class: Historical Med   . Order #: [id number] Class: Historical Med  . Order #: [id number] Class: Historical Med  . Order #: [id number] Class: Historical Med  . Order #: [id number] Class: Historical Med  . Order #: [id number] Class: Historical Med  . Order #: [id number] Class: Historical Med  . Order #: [id number] Class: Historical Med  . Order #: [id number] Class: Historical Med  . Order #: [id number] Class: Historical Med  . Order #: [id number] Class: Historical Med  . Order #: [id number] Class: Historical Med  . Order #: [id number] Class: Historical Med  . Order #: [id number] Class: Historical Med  . Order #: [id number] Class: Historical Med    Allergies Ivp dye [iodinated diagnostic agents]  No family history on file.  Social History Social History  Substance Use Topics  . Smoking status: Former Research scientist (life sciences)  . Smokeless tobacco: Never Used  . Alcohol use Yes    Review of Systems Limited due to patient altered mental status  ____________________________________________   PHYSICAL EXAM:  VITAL SIGNS: ED Triage Vitals  Enc Vitals Group     BP 11/28/15 1422 (!) 79/39     Pulse Rate 11/28/15 1421 62     Resp 11/28/15 1421 20     Temp 11/28/15 1421 97.5 F (36.4 C)     Temp Source 11/28/15 1421 Oral     SpO2 11/28/15 1421 91 %     Weight 11/28/15 1422 190 lb (86.2 kg)     Height --      Head Circumference --      Peak Flow --      Pain  Score --      Pain Loc --      Pain Edu? --      Excl. in [place]? --     Constitutional: Patient is alert and knows the year, the month, and where he is. He is unsure why he is here. He is able to protect his airway and follow commands appropriate. Eyes: Conjunctivae are normal.  EOMI. No scleral icterus. Head: Atraumatic. Nose: No congestion/rhinnorhea. Mouth/Throat: Mucous membranes are moist.  Neck: No stridor.  Supple.  No JVD. In and just missed period Cardiovascular: Normal rate, regular rhythm. No murmurs, rubs or gallops.  Respiratory: Normal respiratory effort.  No  accessory muscle use or retractions. Lungs CTAB.  No wheezes, rales or ronchi. Gastrointestinal: Overweight. 12 inch midline upper abdominal incision that is scabbed without any purulent discharge, swelling or erythema. Soft, nontender and nondistended.  No guarding or rebound.  No peritoneal signs. Musculoskeletal: No LE edema. Bilateral BKA's. Neurologic:  Alert and oriented 3. Speech is occasionally slurred. Face is symmetric. 3 out of 5 right grip, biceps and triceps strength. Skin:  Skin is warm, dry and intact. No rash noted. Psychiatric: Mood and affect are normal.   ____________________________________________   LABS (all labs ordered are listed, but only abnormal results are displayed)  Labs Reviewed  CBC WITH DIFFERENTIAL/PLATELET - Abnormal; Notable for the following:       Result Value   RBC 2.84 (*)    Hemoglobin 7.1 (*)    HCT 22.6 (*)    MCV 79.6 (*)    MCH 24.9 (*)    MCHC 31.3 (*)    RDW 22.1 (*)    Platelets 135 (*)    Lymphs Abs 0.9 (*)    All other components within normal limits  BLOOD GAS, VENOUS - Abnormal; Notable for the following:    Bicarbonate 33.0 (*)    Acid-Base Excess 7.5 (*)    All other components within normal limits  CULTURE, BLOOD (ROUTINE X 2)  CULTURE, BLOOD (ROUTINE X 2)  LACTIC ACID, PLASMA  APTT  PROTIME-INR  AMMONIA  COMPREHENSIVE METABOLIC PANEL  LACTIC ACID, PLASMA  LIPASE, BLOOD  TROPONIN I  TYPE AND SCREEN   ____________________________________________  EKG  ED ECG REPORT I, Eula Listen, the attending physician, personally viewed and interpreted this ECG.   Date: 11/28/2015  EKG Time: 1423  Rate: 61  Rhythm: AV paced  Axis: normal  Intervals:none  ST&T Change: Paced  ____________________________________________  RADIOLOGY  Ct Head Wo Contrast  Result Date: 11/28/2015 CLINICAL DATA:  70 year old diabetic hypertensive male with altered mental status. Last dialysis 2 days ago. Initial encounter.  EXAM: CT HEAD WITHOUT CONTRAST TECHNIQUE: Contiguous axial images were obtained from the base of the skull through the vertex without intravenous contrast. COMPARISON:  None. FINDINGS: Brain: No intracranial hemorrhage. Remote left frontal lobe infarct with encephalomalacia. Mild to moderate chronic microvascular changes without CT evidence of large acute infarct. Mild moderate global atrophy without hydrocephalus. No intracranial mass lesion noted on this unenhanced exam. Vascular: Vascular calcifications. Skull: No acute abnormality. Sinuses/Orbits: Prior orbital surgery bilaterally. There may be a radiopaque structure within the right globe precluding MR. Correlation with plain films recommended prior to MR imaging. Visualized paranasal sinuses, mastoid air cells and middle ear cavities are clear. Other: Degenerative changes C1-2 articulation with marked spinal stenosis and cord flattening. IMPRESSION: No intracranial hemorrhage. Remote left frontal lobe infarct. Mild to moderate chronic microvascular changes without CT evidence of large acute infarct. Mild  to moderate global atrophy. No intracranial mass lesion noted on this unenhanced exam. Prior orbital surgery bilaterally. There may be a radiopaque structure within the right globe precluding MR. Correlation with plain films recommended prior to MR imaging. Degenerative changes C1-2 articulation with marked spinal stenosis and cord flattening. Electronically Signed   By: Genia Del M.D.   On: 11/28/2015 15:13    ____________________________________________   PROCEDURES  Procedure(s) performed: None  Procedures  Critical Care performed: Yes, see critical care note(s) ____________________________________________   INITIAL IMPRESSION / ASSESSMENT AND PLAN / ED COURSE  Pertinent labs & imaging results that were available during my care of the patient were reviewed by me and considered in my medical decision making (see chart for  details).  70 y.o. male with multiple comorbidities brought from his nursing facility for altered mental status and possible new right handed weakness. Overall, I'm concerned about the patient's hypotension and mental status changes. Is possible that he had dialysis today and was over dialyzed. Is also possible that he may have an intra-abdominal infection or bacteremia. He has strong risk factors for ACS or MI. He is not hypoxic and his baseline oxygen, but given his recent surgery and hospitalization, PE is possible but less likely. At this time, we will proceed with stabilization with intravenous fluid, and evaluate him with a CT of the head, CT of the abdomen, and basic laboratory studies. We have artery contacted the [facility] for likely transfer.    CRITICAL CARE Performed by: Eula Listen   Total critical care time: 40 minutes  Critical care time was exclusive of separately billable procedures and treating other patients.  Critical care was necessary to treat or prevent imminent or life-threatening deterioration.  Critical care was time spent personally by me on the following activities: development of treatment plan with patient and/or surrogate as well as nursing, discussions with consultants, evaluation of patient's response to treatment, examination of patient, obtaining history from patient or surrogate, ordering and performing treatments and interventions, ordering and review of laboratory studies, ordering and review of radiographic studies, pulse oximetry and re-evaluation of patient's condition.  The patient has been signed out to Dr. Burlene Arnt for further evaluation and treatment.  I have personally introduced Dr. Burlene Arnt to the patient as well.  3:34 PM Per the patient's nurse, last HD Sat.  Seen that day for AMS, and discharged home.  Went to [facility] 11/26 for AMS yesterday and hypoxia; discharged.  Today, lethargic, didn't know time of day/year; sleepy.  Unable to feed himself,  which is unusual.  Working with therapy and noted to have right sided weakness.  Friday - coffee ground emesis. ____________________________________________  FINAL CLINICAL IMPRESSION(S) / ED DIAGNOSES  Final diagnoses:  Hypotension, unspecified hypotension type  Altered mental status, unspecified altered mental status type  Right sided weakness    Clinical Course       NEW MEDICATIONS STARTED DURING THIS VISIT:  New Prescriptions   No medications on file      Eula Listen, MD 11/28/15 1531    Eula Listen, MD 11/28/15 1537

## 2015-11-28 NOTE — Progress Notes (Signed)
   [place] at [facility] Day: 0 days Gregory Livingston is a 70 y.o. male presenting with Altered Mental Status .   Advance care planning discussed with patient  with additional Family at bedside. All questions in regards to overall condition and expected prognosis answered. The decision was made to continue current code status  CODE STATUS: full Time spent: 18 minutes

## 2015-11-28 NOTE — ED Notes (Signed)
Pt turned to right

## 2015-11-28 NOTE — ED Notes (Signed)
Pt turned to left side.

## 2015-11-29 DIAGNOSIS — L8990 Pressure ulcer of unspecified site, unspecified stage: Secondary | ICD-10-CM | POA: Insufficient documentation

## 2015-11-29 LAB — BLOOD CULTURE ID PANEL (REFLEXED)
ACINETOBACTER BAUMANNII: NOT DETECTED
CANDIDA ALBICANS: NOT DETECTED
CANDIDA GLABRATA: NOT DETECTED
CANDIDA KRUSEI: NOT DETECTED
CANDIDA PARAPSILOSIS: NOT DETECTED
Candida tropicalis: NOT DETECTED
ENTEROBACTERIACEAE SPECIES: NOT DETECTED
ESCHERICHIA COLI: NOT DETECTED
Enterobacter cloacae complex: NOT DETECTED
Enterococcus species: NOT DETECTED
Haemophilus influenzae: NOT DETECTED
KLEBSIELLA OXYTOCA: NOT DETECTED
Klebsiella pneumoniae: NOT DETECTED
Listeria monocytogenes: NOT DETECTED
METHICILLIN RESISTANCE: DETECTED — AB
Neisseria meningitidis: NOT DETECTED
PSEUDOMONAS AERUGINOSA: NOT DETECTED
Proteus species: NOT DETECTED
STREPTOCOCCUS PNEUMONIAE: NOT DETECTED
STREPTOCOCCUS PYOGENES: NOT DETECTED
Serratia marcescens: NOT DETECTED
Staphylococcus aureus (BCID): NOT DETECTED
Staphylococcus species: DETECTED — AB
Streptococcus agalactiae: NOT DETECTED
Streptococcus species: NOT DETECTED

## 2015-11-29 LAB — BASIC METABOLIC PANEL
Anion gap: 10 (ref 5–15)
BUN: 41 mg/dL — AB (ref 6–20)
CO2: 29 mmol/L (ref 22–32)
CREATININE: 3.99 mg/dL — AB (ref 0.61–1.24)
Calcium: 9.6 mg/dL (ref 8.9–10.3)
Chloride: 96 mmol/L — ABNORMAL LOW (ref 101–111)
GFR, EST AFRICAN AMERICAN: 16 mL/min — AB (ref >60)
GFR, EST NON AFRICAN AMERICAN: 14 mL/min — AB (ref >60)
Glucose, Bld: 103 mg/dL — ABNORMAL HIGH (ref 65–99)
POTASSIUM: 3.8 mmol/L (ref 3.5–5.1)
SODIUM: 135 mmol/L (ref 135–145)

## 2015-11-29 LAB — CBC
HCT: 24.2 % — ABNORMAL LOW (ref 40.0–52.0)
Hemoglobin: 7.8 g/dL — ABNORMAL LOW (ref 13.0–18.0)
MCH: 25.7 pg — ABNORMAL LOW (ref 26.0–34.0)
MCHC: 32.3 g/dL (ref 32.0–36.0)
MCV: 79.7 fL — ABNORMAL LOW (ref 80.0–100.0)
PLATELETS: 120 10*3/uL — AB (ref 150–440)
RBC: 3.03 MIL/uL — ABNORMAL LOW (ref 4.40–5.90)
RDW: 21.2 % — AB (ref 11.5–14.5)
WBC: 4.6 10*3/uL (ref 3.8–10.6)

## 2015-11-29 LAB — PHOSPHORUS: Phosphorus: 3.2 mg/dL (ref 2.5–4.6)

## 2015-11-29 LAB — MRSA PCR SCREENING: MRSA by PCR: NEGATIVE

## 2015-11-29 LAB — GLUCOSE, CAPILLARY: GLUCOSE-CAPILLARY: 113 mg/dL — AB (ref 65–99)

## 2015-11-29 MED ORDER — SODIUM CHLORIDE 0.9 % IV SOLN: 100.0000 mL | INTRAVENOUS | Status: DC | PRN

## 2015-11-29 MED ORDER — LIDOCAINE-PRILOCAINE 2.5-2.5 % EX CREA
1.0000 "application " | TOPICAL_CREAM | CUTANEOUS | Status: DC | PRN
  Filled 2015-11-29: qty 5

## 2015-11-29 MED ORDER — HEPARIN SODIUM (PORCINE) 1000 UNIT/ML DIALYSIS
1000.0000 [IU] | INTRAMUSCULAR | Status: DC | PRN
  Filled 2015-11-29: qty 1

## 2015-11-29 MED ORDER — VANCOMYCIN HCL 10 G IV SOLR
2000.0000 mg | Freq: Once | INTRAVENOUS | Status: DC
  Administered 2015-11-29: 2000 mg via INTRAVENOUS
  Filled 2015-11-29: qty 2000

## 2015-11-29 MED ORDER — ALTEPLASE 2 MG IJ SOLR: 2.0000 mg | Freq: Once | INTRAMUSCULAR | Status: DC | PRN

## 2015-11-29 MED ORDER — LIDOCAINE HCL (PF) 1 % IJ SOLN
5.0000 mL | INTRAMUSCULAR | Status: DC | PRN
  Filled 2015-11-29: qty 5

## 2015-11-29 MED ORDER — PENTAFLUOROPROP-TETRAFLUOROETH EX AERO
1.0000 "application " | INHALATION_SPRAY | CUTANEOUS | Status: DC | PRN
  Filled 2015-11-29: qty 30

## 2015-11-29 NOTE — Progress Notes (Signed)
Post hd assessment 

## 2015-11-29 NOTE — Progress Notes (Signed)
Post tx venous site prolonged bleeding approximately 40 minutes. Surgi foam applied. Pt had no more bleeding. Primary RN aware.

## 2015-11-29 NOTE — Progress Notes (Signed)
  End of hd 

## 2015-11-29 NOTE — Progress Notes (Signed)
Notified patient's daughter and sister about discharge being put on hold.

## 2015-11-29 NOTE — Progress Notes (Addendum)
2:46 PM Patient will be held due to a positive blood culture. Will cancel DC. Family is aware as well as SNF. Will continue to follow and assist with dc planning once medically stable.   Patient medically stable for discharge back to SNF today. LCSW contacted facility and agreeable to accept after HD around 4:30pm.   LCSW will arrange for EMS and complete DC packet.  Family contacted by CM, aware of discharge and in agreement with plan.  No other needs. DC after HD Return to [facility].  Lane Hacker, MSW Clinical Social Work: [employer] Coverage for :  [phone]

## 2015-11-29 NOTE — Progress Notes (Signed)
Start of hd 

## 2015-11-29 NOTE — Progress Notes (Signed)
[place] at [place] NAME: Gregory Livingston    MR#:  [id number]  DATE OF BIRTH:  08/24/1945  SUBJECTIVE:    REVIEW OF SYSTEMS:   ROS Tolerating Diet: Tolerating PT:   DRUG ALLERGIES:   Allergies  Allergen Reactions  . Ivp Dye [Iodinated Diagnostic Agents]     VITALS:  Blood pressure (P) 107/60, pulse (P) 96, temperature (P) 97.5 F (36.4 C), temperature source (P) Oral, resp. rate (P) 15, height 4\' 6"  (1.372 m), weight (P) 86.3 kg (190 lb 4.1 oz), SpO2 (P) 100 %.  PHYSICAL EXAMINATION:   Physical Exam  GENERAL:  70 y.o.-year-old patient lying in the bed with no acute distress.  EYES: Pupils equal, round, reactive to light and accommodation. No scleral icterus. Extraocular muscles intact.  HEENT: Head atraumatic, normocephalic. Oropharynx and nasopharynx clear.  NECK:  Supple, no jugular venous distention. No thyroid enlargement, no tenderness.  LUNGS: Normal breath sounds bilaterally, no wheezing, rales, rhonchi. No use of accessory muscles of respiration.  CARDIOVASCULAR: S1, S2 normal. No murmurs, rubs, or gallops.  ABDOMEN: Soft, nontender, nondistended. Bowel sounds present. No organomegaly or mass.  EXTREMITIES: No cyanosis, clubbing or edema b/l.    NEUROLOGIC: Cranial nerves II through XII are intact. No focal Motor or sensory deficits b/l.   PSYCHIATRIC:  patient is alert and oriented x 3.  SKIN: No obvious rash, lesion, or ulcer.   LABORATORY PANEL:  CBC  Recent Labs Lab 11/29/15 0449  WBC 4.6  HGB 7.8*  HCT 24.2*  PLT 120*    Chemistries   Recent Labs Lab 11/28/15 1428  11/29/15 0449  NA 132*  --  135  K 4.3  --  3.8  CL 94*  --  96*  CO2 28  --  29  GLUCOSE 140*  --  103*  BUN 33*  --  41*  CREATININE 3.65*  < > 3.99*  CALCIUM 9.7  --  9.6  AST 40  --   --   ALT 18  --   --   ALKPHOS 48  --   --   BILITOT 1.1  --   --   < > = values in this interval not displayed. Cardiac Enzymes  Recent  Labs Lab 11/28/15 1428  TROPONINI 0.09*   RADIOLOGY:  Ct Abdomen Pelvis Wo Contrast  Result Date: 11/28/2015 CLINICAL DATA:  Dialysis 2 days ago. Patient presents with acute mental status change and weakness on her right side. Hypotension. EXAM: CT ABDOMEN AND PELVIS WITHOUT CONTRAST TECHNIQUE: Multidetector CT imaging of the abdomen and pelvis was performed following the standard protocol without IV contrast. COMPARISON:  None. FINDINGS: Lower chest: Small bilateral effusions and atelectasis are identified. The distal esophagus is fluid-filled. Coronary artery calcifications are identified. Hepatobiliary: Previous cholecystectomy. The liver is unremarkable. The portal vein is not assessed without contrast. Pancreas: Unremarkable. No pancreatic ductal dilatation or surrounding inflammatory changes. Spleen: Normal in size without focal abnormality. Adrenals/Urinary Tract: Vascular calcifications are associated with the bilateral kidneys. A small nonobstructive stone is not excluded in the upper right kidney. No ureterectasis or ureteral stones. The left kidney demonstrates a nodular contour and a low-attenuation mass in the anterior upper pole as seen on coronal image 52 and axial image 38 measures up to 3.8 cm with an attenuation of less than 8 Hounsfield units consistent with a cyst. The bladder is decompressed and somewhat thick walled, possibly due to decompression. Mild increased attenuation in the  fat around the bladder. Stomach/Bowel: The majority of the small bowel is in the right side of the abdomen consistent with malrotation. No evidence of obstruction. The colon and appendix are unremarkable. Vascular/Lymphatic: Dense atherosclerotic changes seen in the abdominal aorta, iliac vessels, and femoral vessels. An infrarenal abdominal aortic aneurysm is identified measuring 5.6 cm in AP diameter. Slight increased attenuation is seen in the aneurysm sac on series 2, image 54 to the left, likely faint  calcification. No adjacent stranding to suggest pending rupture. Reproductive: The patient is status post prostatectomy. Other: Free intraperitoneal air is seen in the right side of the abdomen on series 2, image 26 with a small amount of adjacent fluid. No other free air is identified. A small amount of fluid is seen anteriorly in the abdomen on series 2, image 26. No other free fluid identified. Evidence of previous midline abdominal surgery. Increased attenuation in the subcutaneous and intra-abdominal fat diffusely consist with volume overload. More focal increased attenuation seen in the fat of the anterior abdomen such as on axial image 45. Musculoskeletal: Degenerative changes most marked at L3 and L4. IMPRESSION: 1. There is a focus of free air in the anterior right abdomen with a small amount of adjacent fluid. Just to the left and inferior to this free air is a small amount of additional fluid and fat stranding. I spoke to the emergency room physician, Dr. Mariea Clonts, who indicated the patient had abdominal surgery to repair a perforated ulcer 8 days ago. The recent surgery explains the free air, fat stranding, and small amount of free fluid. 2. Mild increased attenuation around a decompressed bladder. Recommend correlation with urinalysis to exclude a UTI. 3. Low attenuation left renal mass, consistent with a cyst. 4. Focal abdominal aortic aneurysm measuring up to 5.6 cm. 5. Atherosclerosis of the aorta and branching vessels. 6. Small bilateral pleural effusions and atelectasis. 7. The distal esophagus is fluid-filled, possibly due to reflux. Electronically Signed   By: Dorise Bullion III M.D   On: 11/28/2015 15:30   Ct Head Wo Contrast  Result Date: 11/28/2015 CLINICAL DATA:  70 year old diabetic hypertensive male with altered mental status. Last dialysis 2 days ago. Initial encounter. EXAM: CT HEAD WITHOUT CONTRAST TECHNIQUE: Contiguous axial images were obtained from the base of the skull through  the vertex without intravenous contrast. COMPARISON:  None. FINDINGS: Brain: No intracranial hemorrhage. Remote left frontal lobe infarct with encephalomalacia. Mild to moderate chronic microvascular changes without CT evidence of large acute infarct. Mild moderate global atrophy without hydrocephalus. No intracranial mass lesion noted on this unenhanced exam. Vascular: Vascular calcifications. Skull: No acute abnormality. Sinuses/Orbits: Prior orbital surgery bilaterally. There may be a radiopaque structure within the right globe precluding MR. Correlation with plain films recommended prior to MR imaging. Visualized paranasal sinuses, mastoid air cells and middle ear cavities are clear. Other: Degenerative changes C1-2 articulation with marked spinal stenosis and cord flattening. IMPRESSION: No intracranial hemorrhage. Remote left frontal lobe infarct. Mild to moderate chronic microvascular changes without CT evidence of large acute infarct. Mild to moderate global atrophy. No intracranial mass lesion noted on this unenhanced exam. Prior orbital surgery bilaterally. There may be a radiopaque structure within the right globe precluding MR. Correlation with plain films recommended prior to MR imaging. Degenerative changes C1-2 articulation with marked spinal stenosis and cord flattening. Electronically Signed   By: Genia Del M.D.   On: 11/28/2015 15:13   Dg Chest Port 1 View  Result Date: 11/28/2015 CLINICAL DATA:  Altered mental status EXAM: PORTABLE CHEST 1 VIEW COMPARISON:  01/27/2015 FINDINGS: Cardiomediastinal silhouette is stable. Dual lead cardiac pacemaker is unchanged in position. Status post CABG. No infiltrate or pulmonary edema. IMPRESSION: No active disease. Electronically Signed   By: Lahoma Crocker M.D.   On: 11/28/2015 16:12   ASSESSMENT AND PLAN:  71 y.o. male with a PMHx of ESRD on HD at the [facility], anemia chronic kidney disease, secondary hyperparathyroidism, aortic aneurysm, bilateral  below the knee amputation, recent peptic ulcer repair, who was admitted to [facility] on 11/28/2015 for evaluation of hypoxia.  The patient is on continuous oxygen at the nursing home at [facility].   Patient is a poor historian unable to offer significant history  1. Hypotension: resolved now after IV bolus NS x1 yeday -mentation appears to be at baseline -no fever -resume BP meds before D/c -1/2 BC positive for staph species . Empiric vancomycin for now  2. Acute encephalopathy: Resolved patient at baseline per family  3. Microcytic anemia: Follow CBC transfuse hemoglobin less than 7  4. Chronic heart failure unspecified ejection fraction: Hold imdur  5. End-stage renal disease on dialysis consult nephrology for continuation of dialysis  No indication for transfer to [place] since pt improving and [facility] on [place] [phone] x [phone] -spoke with her this morning  D/w dter on the phone -[place] Case discussed with Care Management/Social Worker. Management plans discussed with the patient, family and they are in agreement.  CODE STATUS: full  DVT Prophylaxis: heparin TOTAL TIME TAKING CARE OF THIS PATIENT: 30 minutes.  >50% time spent on counselling and coordination of care  POSSIBLE D/C IN 1-2 DAYS, DEPENDING ON CLINICAL CONDITION.  Note: This dictation was prepared with Dragon dictation along with smaller phrase technology. Any transcriptional errors that result from this process are unintentional.  Nakema Fake M.D on 11/29/2015 at 5:11 PM  Between 7am to 6pm - Pager - [phone]  After 6pm go to [email or web address] - password [email or web address] [facility]  [facility]  Office  [phone]  CC: Primary care physician; No PCP Per Patient

## 2015-11-29 NOTE — Progress Notes (Signed)
Pre hd info 

## 2015-11-29 NOTE — Care Management (Signed)
Spoke with Margarita Grizzle from the [facility].  There had been discussion 11.27 of  an ED to ED transfer but facility was on diversion.  Now that patient had been admitted, facility is not able to accept patient due to dialysis and on acute bed diversion.  CM has attempted to call patient's daughter Latricia Heft but unable to leave a voicemail as the phone has not been set up to receive voicemail messages.  Patient receives his chronic dialysis at the [facility].  Notified Alda Lea at [facility].  Anticipate transfer back to [facility] today per attending

## 2015-11-29 NOTE — Consult Note (Signed)
[facility] CONSULT NOTE    Date: 11/29/2015                  Patient Name:  Gregory Livingston  MRN: [id number]  DOB: Oct 30, 1945  Age / Sex: 70 y.o., male         PCP: No PCP Per Patient                 Service Requesting Consult: Internal Medicine                 Reason for Consult: Management of ESRD            History of Present Illness: Patient is a 70 y.o. male with a PMHx of ESRD on HD at the [facility], anemia chronic kidney disease, secondary hyperparathyroidism, aortic aneurysm, bilateral below the knee amputation, recent peptic ulcer repair, who was admitted to [facility] on 11/28/2015 for evaluation of hypoxia.  The patient is on continuous oxygen at the nursing home at [facility].   Patient is a poor historian unable to offer significant history.  The patient had recent peptic ulcer repair at the [place] in [place].  Patient was subsequently moved over to the nursing home.  Apparently he has been transported to the dialysis Center in the [place] 3 times a week for hemodialysis.  He has a right upper extremity AV fistula in place.  I called the [facility] and they confirm that he is a patient there on TTS schedule.  We have scheduled the patient for hemodialysis today.   Medications: Outpatient medications: Prescriptions Prior to Admission  Medication Sig Dispense Refill Last Dose  . albuterol (PROVENTIL HFA;VENTOLIN HFA) 108 (90 Base) MCG/ACT inhaler Inhale 2 puffs into the lungs every 6 (six) hours as needed for shortness of breath.   11/28/2015 at 1201  . aspirin 81 MG chewable tablet Chew 81 mg by mouth daily.   11/28/2015 at 0815  . atorvastatin (LIPITOR) 40 MG tablet Take 40 mg by mouth at bedtime.   11/26/2015 at 2128  . B Complex-C-Folic Acid (RENAL VITAMIN PO) Take 1 tablet by mouth daily.   11/28/2015 at 0815  . bacitracin 500 UNIT/GM ointment Apply 1 application topically daily. Apply small amount topically to left knee abrasion daily    11/27/2015 at 1042  . brimonidine (ALPHAGAN) 0.2 % ophthalmic solution Place 1 drop into the left eye 3 (three) times daily.   11/28/2015 at 1201  . calcium carbonate (CALCIUM 600) 1500 (600 Ca) MG TABS tablet Take 600 mg of elemental calcium by mouth every 6 (six) hours as needed.   prn at prn  . carvedilol (COREG) 25 MG tablet Take 25 mg by mouth every 12 (twelve) hours.   11/28/2015 at 0941  . cinacalcet (SENSIPAR) 60 MG tablet Take 120 mg by mouth daily. Take 120 mg every evening with food.   11/27/2015 at 1613  . clopidogrel (PLAVIX) 75 MG tablet Take 75 mg by mouth daily.   11/28/2015 at 0815  . Difluprednate (DUREZOL) 0.05 % EMUL Place 1 drop into both eyes daily.   11/28/2015 at 0815  . folic acid (FOLVITE) 1 MG tablet Take 2 mg by mouth daily.   11/28/2015 at 0815  . gabapentin (NEURONTIN) 100 MG capsule Take 200 mg by mouth at bedtime.   Past Week at Unknown time  . hydroxypropyl methylcellulose / hypromellose (ISOPTO TEARS / GONIOVISC) 2.5 % ophthalmic solution Place 1 drop into both eyes  4 (four) times daily as needed for dry eyes.   prn at prn  . isosorbide mononitrate (IMDUR) 120 MG 24 hr tablet Take 240 mg by mouth daily.   11/28/2015 at 0815  . nitroGLYCERIN (NITROSTAT) 0.4 MG SL tablet Place 0.4 mg under the tongue every 5 (five) minutes as needed for chest pain.   11/25/2015 at 0516  . Nutritional Supplements (FEEDING SUPPLEMENT, NEPRO CARB STEADY,) LIQD Take 237 mLs by mouth 3 (three) times daily.   11/28/2015 at 1201  . oxyCODONE (OXY IR/ROXICODONE) 5 MG immediate release tablet Take 5 mg by mouth every 4 (four) hours as needed for severe pain.   Past Week at Unknown time  . pantoprazole (PROTONIX) 40 MG tablet Take 40 mg by mouth 2 (two) times daily.   11/28/2015 at 0814  . ranolazine (RANEXA) 500 MG 12 hr tablet Take 500 mg by mouth 2 (two) times daily.   11/28/2015 at 0815  . senna (SENOKOT) 8.6 MG TABS tablet Take 2 tablets by mouth daily.   11/28/2015 at 0815  . sevelamer  carbonate (RENVELA) 800 MG tablet Take 2,400 mg by mouth 3 (three) times daily with meals.   11/28/2015 at 1201    Current medications: Current Facility-Administered Medications  Medication Dose Route Frequency Provider Last Rate Last Dose  . 0.9 %  sodium chloride infusion  100 mL Intravenous PRN Shakinah Navis, MD      . 0.9 %  sodium chloride infusion  100 mL Intravenous PRN Addelynn Batte, MD      . acetaminophen (TYLENOL) tablet 650 mg  650 mg Oral Q6H PRN Lytle Butte, MD       Or  . acetaminophen (TYLENOL) suppository 650 mg  650 mg Rectal Q6H PRN Lytle Butte, MD      . albuterol (PROVENTIL) (2.5 MG/3ML) 0.083% nebulizer solution 2.5 mg  2.5 mg Inhalation Q6H PRN Lytle Butte, MD      . alteplase (CATHFLO ACTIVASE) injection 2 mg  2 mg Intracatheter Once PRN Addisyn Leclaire, MD      . aspirin chewable tablet 81 mg  81 mg Oral Daily Lytle Butte, MD   81 mg at 11/29/15 0929  . atorvastatin (LIPITOR) tablet 40 mg  40 mg Oral QHS Lytle Butte, MD   40 mg at 11/28/15 2149  . brimonidine (ALPHAGAN) 0.2 % ophthalmic solution 1 drop  1 drop Left Eye TID Lytle Butte, MD   1 drop at 11/29/15 0926  . cinacalcet (SENSIPAR) tablet 120 mg  120 mg Oral Q supper Lytle Butte, MD   120 mg at 11/28/15 2149  . clopidogrel (PLAVIX) tablet 75 mg  75 mg Oral Daily Lytle Butte, MD   75 mg at 11/29/15 9563  . feeding supplement (NEPRO CARB STEADY) liquid 237 mL  237 mL Oral TID Lytle Butte, MD   237 mL at 87/56/43 3295  . folic acid (FOLVITE) tablet 2 mg  2 mg Oral Daily Lytle Butte, MD   2 mg at 11/29/15 1884  . heparin injection 1,000 Units  1,000 Units Dialysis PRN Jeilani Grupe, MD      . heparin injection 5,000 Units  5,000 Units Subcutaneous Q8H Lytle Butte, MD   5,000 Units at 11/29/15 0517  . lidocaine (PF) (XYLOCAINE) 1 % injection 5 mL  5 mL Intradermal PRN Kharizma Lesnick, MD      . lidocaine-prilocaine (EMLA) cream 1 application  1 application Topical PRN Taggert Bozzi  Holley Raring, MD      .  ondansetron (ZOFRAN) tablet 4 mg  4 mg Oral Q6H PRN Lytle Butte, MD       Or  . ondansetron [facility]) injection 4 mg  4 mg Intravenous Q6H PRN Lytle Butte, MD      . oxyCODONE (Oxy IR/ROXICODONE) immediate release tablet 5 mg  5 mg Oral Q4H PRN Lytle Butte, MD      . pantoprazole (PROTONIX) EC tablet 40 mg  40 mg Oral BID Lytle Butte, MD   40 mg at 11/29/15 0929  . pentafluoroprop-tetrafluoroeth (GEBAUERS) aerosol 1 application  1 application Topical PRN Marshelle Bilger, MD      . polyvinyl alcohol (LIQUIFILM TEARS) 1.4 % ophthalmic solution 1 drop  1 drop Both Eyes QID PRN Lytle Butte, MD      . prednisoLONE acetate (PRED FORTE) 1 % ophthalmic suspension 1 drop  1 drop Both Eyes BID Lytle Butte, MD   1 drop at 11/29/15 9767  . ranolazine (RANEXA) 12 hr tablet 500 mg  500 mg Oral BID Lytle Butte, MD   500 mg at 11/29/15 0930  . senna (SENOKOT) tablet 17.2 mg  2 tablet Oral Daily Lytle Butte, MD      . sevelamer carbonate (RENVELA) tablet 2,400 mg  2,400 mg Oral TID WC Lytle Butte, MD   2,400 mg at 11/29/15 3419  . sodium chloride flush (NS) 0.9 % injection 3 mL  3 mL Intravenous Q12H Lytle Butte, MD   3 mL at 11/29/15 0929  . vancomycin (VANCOCIN) 2,000 mg in sodium chloride 0.9 % 500 mL IVPB  2,000 mg Intravenous Once Fritzi Mandes, MD          Allergies: Allergies  Allergen Reactions  . Ivp Dye [Iodinated Diagnostic Agents]       Past Medical History: Past Medical History:  Diagnosis Date  . AAA (abdominal aortic aneurysm) ([place])   . CHF (congestive heart failure) ([place])   . Diabetes mellitus without complication ([place])   . Dialysis patient ([place])   . Hyperparathyroidism ([place])   . Hypertension   . Multiple myeloma ([place])   . Peptic ulcer with perforation ([place])   . PVD (peripheral vascular disease) ([place])      Past Surgical History: Past Surgical History:  Procedure Laterality Date  . AV FISTULA PLACEMENT    . BELOW KNEE LEG AMPUTATION Bilateral   . bilateral  below knee amputation    . PACEMAKER PLACEMENT       Family History: Family History  Problem Relation Age of Onset  . Hypertension Other      Social History: Social History   Social History  . Marital status: Unknown    Spouse name: N/A  . Number of children: N/A  . Years of education: N/A   Occupational History  . Not on file.   Social History Main Topics  . Smoking status: Former Research scientist (life sciences)  . Smokeless tobacco: Never Used  . Alcohol use Yes  . Drug use: No  . Sexual activity: Not on file   Other Topics Concern  . Not on file   Social History Narrative  . No narrative on file     Review of Systems: Patient unable to provide as he's a poor historian  Vital Signs: Blood pressure (!) 113/51, pulse 79, temperature 98 F (36.7 C), temperature source Oral, resp. rate 13, height 4' 6"  (1.372 m), weight 87.2 kg (192 lb  3.9 oz), SpO2 97 %.  Weight trends: Filed Weights   11/28/15 1422 11/28/15 2024 11/29/15 1310  Weight: 86.2 kg (190 lb) 86.7 kg (191 lb 1.6 oz) 87.2 kg (192 lb 3.9 oz)    Physical Exam: General: NAD, resting in bed  Head: Normocephalic, atraumatic.  Eyes: Anicteric, EOMI  Nose: Mucous membranes moist, not inflammed, nonerythematous.  Throat: Oropharynx nonerythematous, no exudate appreciated.   Neck: Supple, trachea midline.  Lungs:  Normal respiratory effort. Clear to auscultation BL without crackles or wheezes.  Heart: S1S2 no obvious rubs  Abdomen:  BS normoactive. Soft, Nondistended, non-tender.  No masses or organomegaly.  Extremities: Bilateral below the knee amputation.  Neurologic: Awake, alert, follows simple commands  Skin: No visible rashes, scars.    Lab results: Basic Metabolic Panel:  Recent Labs Lab 11/25/15 0605 11/28/15 1428 11/28/15 1938 11/29/15 0449 11/29/15 1335  NA 133* 132*  --  135  --   K 4.4 4.3  --  3.8  --   CL 93* 94*  --  96*  --   CO2 28 28  --  29  --   GLUCOSE 181* 140*  --  103*  --   BUN 38* 33*   --  41*  --   CREATININE 3.54* 3.65* 3.68* 3.99*  --   CALCIUM 10.0 9.7  --  9.6  --   PHOS  --   --   --   --  3.2    Liver Function Tests:  Recent Labs Lab 11/28/15 1428  AST 40  ALT 18  ALKPHOS 48  BILITOT 1.1  PROT 4.9*  ALBUMIN 2.1*    Recent Labs Lab 11/28/15 1428  LIPASE 15    Recent Labs Lab 11/28/15 1436  AMMONIA 19    CBC:  Recent Labs Lab 11/25/15 0605 11/28/15 1428 11/28/15 1938 11/29/15 0449  WBC 9.3 5.0 5.2 4.6  NEUTROABS 7.9* 3.5  --   --   HGB 8.0* 7.1* 7.2* 7.8*  HCT 24.7* 22.6* 22.4* 24.2*  MCV 79.9* 79.6* 79.8* 79.7*  PLT 133* 135* 115* 120*    Cardiac Enzymes:  Recent Labs Lab 11/28/15 1428  TROPONINI 0.09*    BNP: Invalid input(s): POCBNP  CBG:  Recent Labs Lab 11/28/15 2212  GLUCAP 137*    Microbiology: Results for orders placed or performed during the hospital encounter of 11/28/15  Blood Culture (routine x 2)     Status: None (Preliminary result)   Collection Time: 11/28/15  2:28 PM  Result Value Ref Range Status   Specimen Description BLOOD LEFT ARM  Final   Special Requests BOTTLES DRAWN AEROBIC AND ANAEROBIC 5CC  Final   Culture  Setup Time   Final    Organism ID to follow GRAM POSITIVE COCCI AEROBIC BOTTLE ONLY CRITICAL RESULT CALLED TO, READ BACK BY AND VERIFIED WITH: JASON ROBBINS @ [id number] 11/29/2015 JLJ    Culture GRAM POSITIVE COCCI  Final   Report Status PENDING  Incomplete  Blood Culture ID Panel (Reflexed)     Status: Abnormal   Collection Time: 11/28/15  2:28 PM  Result Value Ref Range Status   Enterococcus species NOT DETECTED NOT DETECTED Final   Listeria monocytogenes NOT DETECTED NOT DETECTED Final   Staphylococcus species DETECTED (A) NOT DETECTED Final    Comment: CRITICAL RESULT CALLED TO, READ BACK BY AND VERIFIED WITH: JASON ROBBINS @ 1429 11/29/2015 JLJ    Staphylococcus aureus NOT DETECTED NOT DETECTED Final   Methicillin resistance DETECTED (A)  NOT DETECTED Final    Comment:  CRITICAL RESULT CALLED TO, READ BACK BY AND VERIFIED WITH: JASON ROBBINS @ 9509 11/29/2015 JLJ    Streptococcus species NOT DETECTED NOT DETECTED Final   Streptococcus agalactiae NOT DETECTED NOT DETECTED Final   Streptococcus pneumoniae NOT DETECTED NOT DETECTED Final   Streptococcus pyogenes NOT DETECTED NOT DETECTED Final   Acinetobacter baumannii NOT DETECTED NOT DETECTED Final   Enterobacteriaceae species NOT DETECTED NOT DETECTED Final   Enterobacter cloacae complex NOT DETECTED NOT DETECTED Final   Escherichia coli NOT DETECTED NOT DETECTED Final   Klebsiella oxytoca NOT DETECTED NOT DETECTED Final   Klebsiella pneumoniae NOT DETECTED NOT DETECTED Final   Proteus species NOT DETECTED NOT DETECTED Final   Serratia marcescens NOT DETECTED NOT DETECTED Final   Haemophilus influenzae NOT DETECTED NOT DETECTED Final   Neisseria meningitidis NOT DETECTED NOT DETECTED Final   Pseudomonas aeruginosa NOT DETECTED NOT DETECTED Final   Candida albicans NOT DETECTED NOT DETECTED Final   Candida glabrata NOT DETECTED NOT DETECTED Final   Candida krusei NOT DETECTED NOT DETECTED Final   Candida parapsilosis NOT DETECTED NOT DETECTED Final   Candida tropicalis NOT DETECTED NOT DETECTED Final  Blood Culture (routine x 2)     Status: None (Preliminary result)   Collection Time: 11/28/15  2:36 PM  Result Value Ref Range Status   Specimen Description BLOOD LEFT HAND  Final   Special Requests BOTTLES DRAWN AEROBIC AND ANAEROBIC 5CC  Final   Culture NO GROWTH < 24 HOURS  Final   Report Status PENDING  Incomplete  MRSA PCR Screening     Status: None   Collection Time: 11/29/15  5:10 AM  Result Value Ref Range Status   MRSA by PCR NEGATIVE NEGATIVE Final    Comment:        The GeneXpert MRSA Assay (FDA approved for NASAL specimens only), is one component of a comprehensive MRSA colonization surveillance program. It is not intended to diagnose MRSA infection nor to guide or monitor treatment  for MRSA infections.     Coagulation Studies:  Recent Labs  11/28/15 1428  LABPROT 12.9  INR 0.97    Urinalysis: No results for input(s): COLORURINE, LABSPEC, PHURINE, GLUCOSEU, HGBUR, BILIRUBINUR, KETONESUR, PROTEINUR, UROBILINOGEN, NITRITE, LEUKOCYTESUR in the last 72 hours.  Invalid input(s): APPERANCEUR    Imaging: Ct Abdomen Pelvis Wo Contrast  Result Date: 11/28/2015 CLINICAL DATA:  Dialysis 2 days ago. Patient presents with acute mental status change and weakness on her right side. Hypotension. EXAM: CT ABDOMEN AND PELVIS WITHOUT CONTRAST TECHNIQUE: Multidetector CT imaging of the abdomen and pelvis was performed following the standard protocol without IV contrast. COMPARISON:  None. FINDINGS: Lower chest: Small bilateral effusions and atelectasis are identified. The distal esophagus is fluid-filled. Coronary artery calcifications are identified. Hepatobiliary: Previous cholecystectomy. The liver is unremarkable. The portal vein is not assessed without contrast. Pancreas: Unremarkable. No pancreatic ductal dilatation or surrounding inflammatory changes. Spleen: Normal in size without focal abnormality. Adrenals/Urinary Tract: Vascular calcifications are associated with the bilateral kidneys. A small nonobstructive stone is not excluded in the upper right kidney. No ureterectasis or ureteral stones. The left kidney demonstrates a nodular contour and a low-attenuation mass in the anterior upper pole as seen on coronal image 52 and axial image 38 measures up to 3.8 cm with an attenuation of less than 8 Hounsfield units consistent with a cyst. The bladder is decompressed and somewhat thick walled, possibly due to decompression. Mild increased attenuation in  the fat around the bladder. Stomach/Bowel: The majority of the small bowel is in the right side of the abdomen consistent with malrotation. No evidence of obstruction. The colon and appendix are unremarkable. Vascular/Lymphatic: Dense  atherosclerotic changes seen in the abdominal aorta, iliac vessels, and femoral vessels. An infrarenal abdominal aortic aneurysm is identified measuring 5.6 cm in AP diameter. Slight increased attenuation is seen in the aneurysm sac on series 2, image 54 to the left, likely faint calcification. No adjacent stranding to suggest pending rupture. Reproductive: The patient is status post prostatectomy. Other: Free intraperitoneal air is seen in the right side of the abdomen on series 2, image 26 with a small amount of adjacent fluid. No other free air is identified. A small amount of fluid is seen anteriorly in the abdomen on series 2, image 26. No other free fluid identified. Evidence of previous midline abdominal surgery. Increased attenuation in the subcutaneous and intra-abdominal fat diffusely consist with volume overload. More focal increased attenuation seen in the fat of the anterior abdomen such as on axial image 45. Musculoskeletal: Degenerative changes most marked at L3 and L4. IMPRESSION: 1. There is a focus of free air in the anterior right abdomen with a small amount of adjacent fluid. Just to the left and inferior to this free air is a small amount of additional fluid and fat stranding. I spoke to the emergency room physician, Dr. Mariea Clonts, who indicated the patient had abdominal surgery to repair a perforated ulcer 8 days ago. The recent surgery explains the free air, fat stranding, and small amount of free fluid. 2. Mild increased attenuation around a decompressed bladder. Recommend correlation with urinalysis to exclude a UTI. 3. Low attenuation left renal mass, consistent with a cyst. 4. Focal abdominal aortic aneurysm measuring up to 5.6 cm. 5. Atherosclerosis of the aorta and branching vessels. 6. Small bilateral pleural effusions and atelectasis. 7. The distal esophagus is fluid-filled, possibly due to reflux. Electronically Signed   By: Dorise Bullion III M.D   On: 11/28/2015 15:30   Ct Head Wo  Contrast  Result Date: 11/28/2015 CLINICAL DATA:  70 year old diabetic hypertensive male with altered mental status. Last dialysis 2 days ago. Initial encounter. EXAM: CT HEAD WITHOUT CONTRAST TECHNIQUE: Contiguous axial images were obtained from the base of the skull through the vertex without intravenous contrast. COMPARISON:  None. FINDINGS: Brain: No intracranial hemorrhage. Remote left frontal lobe infarct with encephalomalacia. Mild to moderate chronic microvascular changes without CT evidence of large acute infarct. Mild moderate global atrophy without hydrocephalus. No intracranial mass lesion noted on this unenhanced exam. Vascular: Vascular calcifications. Skull: No acute abnormality. Sinuses/Orbits: Prior orbital surgery bilaterally. There may be a radiopaque structure within the right globe precluding MR. Correlation with plain films recommended prior to MR imaging. Visualized paranasal sinuses, mastoid air cells and middle ear cavities are clear. Other: Degenerative changes C1-2 articulation with marked spinal stenosis and cord flattening. IMPRESSION: No intracranial hemorrhage. Remote left frontal lobe infarct. Mild to moderate chronic microvascular changes without CT evidence of large acute infarct. Mild to moderate global atrophy. No intracranial mass lesion noted on this unenhanced exam. Prior orbital surgery bilaterally. There may be a radiopaque structure within the right globe precluding MR. Correlation with plain films recommended prior to MR imaging. Degenerative changes C1-2 articulation with marked spinal stenosis and cord flattening. Electronically Signed   By: Genia Del M.D.   On: 11/28/2015 15:13   Dg Chest Port 1 View  Result Date: 11/28/2015 CLINICAL DATA:  Altered mental status EXAM: PORTABLE CHEST 1 VIEW COMPARISON:  01/27/2015 FINDINGS: Cardiomediastinal silhouette is stable. Dual lead cardiac pacemaker is unchanged in position. Status post CABG. No infiltrate or pulmonary  edema. IMPRESSION: No active disease. Electronically Signed   By: Lahoma Crocker M.D.   On: 11/28/2015 16:12      Assessment & Plan: Pt is a 70 y.o. male  with a PMHx of ESRD on HD at the [facility], anemia chronic kidney disease, secondary hyperparathyroidism, aortic aneurysm, bilateral below the knee amputation, recent peptic ulcer repair, who was admitted to [facility] on 11/28/2015 for evaluation of hypoxia.    1.  ESRD on HD TTS at the [facility]:  The patient is currently residing at a nursing home here in [place] however it appears that he's being transported to dialysis 3 times a week at the [facility].  This was confirmed through discussion with the dialysis nurse at the [facility].  Patient is to undergo hemodialysis today.  It is possible that the patient may be transferred to the [place] and further dialysis treatment could potentially be continued there.  2.  Anemia chronic kidney disease.  Hemoglobin low at 7.8.  We will consider use of Epogen with subsequent dialysis treatments.  Consider blood transfusion for hemoglobin of 7 or less.  3.  Secondary hyperparathyroidism.  Check intact PTH and phosphorus with dialysis today.  Continue current doses of Renvela as well as Sensipar.  4.  Positive blood cultures.  Staphylococcal species noted.  Unclear as to whether this is contaminant.  Patient's right upper external AV fistula appeared to be free of infection.  Continue vancomycin for now.

## 2015-11-29 NOTE — Progress Notes (Signed)
ID E Note Pt not available Repeat bcx Hold abx Check echo

## 2015-11-29 NOTE — Consult Note (Addendum)
Pharmacy Antibiotic Note  Gregory Livingston is a 70 y.o. male admitted on 11/28/2015 with bacteremia.  Pharmacy has been consulted for vancomycin dosing. Pt growing staph species in 1/4 bottles MecA positive currently. Pt was admitted with hypotension and confusion. Pt is an ESRD pt on HD.    Plan: vancomycin 2000mg  once as a load, then 1000mg  q HD. Will need to follow up for HD schedule.  Pt has had a bilateral below the knee amputation. Height prior to amputation about 6". Will use pt actual weight for vanc dosing.  Height: 4\' 6"  (137.2 cm) Weight: 192 lb 3.9 oz (87.2 kg) IBW/kg (Calculated) : 36.2  Temp (24hrs), Avg:97.8 F (36.6 C), Min:97.3 F (36.3 C), Max:98.3 F (36.8 C)   Recent Labs Lab 11/25/15 0605 11/28/15 1428 11/28/15 1938 11/29/15 0449  WBC 9.3 5.0 5.2 4.6  CREATININE 3.54* 3.65* 3.68* 3.99*  LATICACIDVEN  --  1.1 0.9  --     Estimated Creatinine Clearance: 13.4 mL/min (by C-G formula based on SCr of 3.99 mg/dL (H)).    Allergies  Allergen Reactions  . Ivp Dye [Iodinated Diagnostic Agents]     Antimicrobials this admission: vancomycin 11/28 >>    Dose adjustments this admission:   Microbiology results: 11/27 BCx: staph species, mec A positive   Thank you for allowing pharmacy to be a part of this patient's care.  Ramond Dial 11/29/2015 2:57 PM

## 2015-11-29 NOTE — NC FL2 (Signed)
[place] LEVEL OF CARE SCREENING TOOL     IDENTIFICATION  Patient Name: Gregory Livingston Birthdate: 1945-12-24 Sex: male Admission Date (Current Location): 11/28/2015  [facility] and [place] Number:  Engineering geologist and Address:  [facility], [address] [place], [place] [address]      Provider Number: [id number]  Attending Physician Name and Address:  Fritzi Mandes, MD  Relative Name and Phone Number:       Current Level of Care: Hospital Recommended Level of Care: [place] Prior Approval Number:    Date Approved/Denied:   PASRR Number:    Discharge Plan: SNF    Current Diagnoses: Patient Active Problem List   Diagnosis Date Noted  . Pressure injury of skin 11/29/2015  . Hypotension 11/28/2015    Orientation RESPIRATION BLADDER Height & Weight     Self, Situation, Place  Normal Continent Weight: 191 lb 1.6 oz (86.7 kg) Height:  4\' 6"  (137.2 cm)  BEHAVIORAL SYMPTOMS/MOOD NEUROLOGICAL BOWEL NUTRITION STATUS      Continent Diet (regular)  AMBULATORY STATUS COMMUNICATION OF NEEDS Skin   Extensive Assist Verbally PU Stage and Appropriate Care   PU Stage 2 Dressing: Daily (Stage II -  Partial thickness loss of dermis presenting as a shallow open ulcer with a red, pink wound bed without slough.)                   Personal Care Assistance Level of Assistance  Bathing, Feeding, Dressing Bathing Assistance: Limited assistance Feeding assistance: Limited assistance Dressing Assistance: Limited assistance     Functional Limitations Info  Sight, Hearing, Speech Sight Info: Adequate Hearing Info: Adequate Speech Info: Adequate    SPECIAL CARE FACTORS FREQUENCY  PT (By licensed PT), OT (By licensed OT)     PT Frequency: 5 OT Frequency: 5            Contractures Contractures Info: Not present    Additional Factors Info  Code Status, Allergies Code Status Info: Full Code Allergies Info: Ivp Dye  Iodinated Diagnostic Agents           Current Medications (11/29/2015):  This is the current hospital active medication list Current Facility-Administered Medications  Medication Dose Route Frequency Provider Last Rate Last Dose  . acetaminophen (TYLENOL) tablet 650 mg  650 mg Oral Q6H PRN Lytle Butte, MD       Or  . acetaminophen (TYLENOL) suppository 650 mg  650 mg Rectal Q6H PRN Lytle Butte, MD      . albuterol (PROVENTIL) (2.5 MG/3ML) 0.083% nebulizer solution 2.5 mg  2.5 mg Inhalation Q6H PRN Lytle Butte, MD      . aspirin chewable tablet 81 mg  81 mg Oral Daily Lytle Butte, MD   81 mg at 11/29/15 0929  . atorvastatin (LIPITOR) tablet 40 mg  40 mg Oral QHS Lytle Butte, MD   40 mg at 11/28/15 2149  . brimonidine (ALPHAGAN) 0.2 % ophthalmic solution 1 drop  1 drop Left Eye TID Lytle Butte, MD   1 drop at 11/29/15 0926  . cinacalcet (SENSIPAR) tablet 120 mg  120 mg Oral Q supper Lytle Butte, MD   120 mg at 11/28/15 2149  . clopidogrel (PLAVIX) tablet 75 mg  75 mg Oral Daily Lytle Butte, MD   75 mg at 11/29/15 [id number]  . feeding supplement (NEPRO CARB STEADY) liquid 237 mL  237 mL Oral TID Lytle Butte, MD  237 mL at 0000000 [id number]  . folic acid (FOLVITE) tablet 2 mg  2 mg Oral Daily Lytle Butte, MD   2 mg at 11/29/15 0928  . heparin injection 5,000 Units  5,000 Units Subcutaneous Q8H Lytle Butte, MD   5,000 Units at 11/29/15 0517  . ondansetron (ZOFRAN) tablet 4 mg  4 mg Oral Q6H PRN Lytle Butte, MD       Or  . ondansetron [facility]) injection 4 mg  4 mg Intravenous Q6H PRN Lytle Butte, MD      . oxyCODONE (Oxy IR/ROXICODONE) immediate release tablet 5 mg  5 mg Oral Q4H PRN Lytle Butte, MD      . pantoprazole (PROTONIX) EC tablet 40 mg  40 mg Oral BID Lytle Butte, MD   40 mg at 11/29/15 0929  . polyvinyl alcohol (LIQUIFILM TEARS) 1.4 % ophthalmic solution 1 drop  1 drop Both Eyes QID PRN Lytle Butte, MD      . prednisoLONE acetate (PRED FORTE) 1 % ophthalmic  suspension 1 drop  1 drop Both Eyes BID Lytle Butte, MD   1 drop at 11/29/15 [id number]  . ranolazine (RANEXA) 12 hr tablet 500 mg  500 mg Oral BID Lytle Butte, MD   500 mg at 11/29/15 0930  . senna (SENOKOT) tablet 17.2 mg  2 tablet Oral Daily Lytle Butte, MD      . sevelamer carbonate (RENVELA) tablet 2,400 mg  2,400 mg Oral TID WC Lytle Butte, MD   2,400 mg at 11/29/15 [id number]  . sodium chloride flush (NS) 0.9 % injection 3 mL  3 mL Intravenous Q12H Lytle Butte, MD   3 mL at 11/29/15 [id number]     Discharge Medications: Please see discharge summary for a list of discharge medications.  Relevant Imaging Results:  Relevant Lab Results:   Additional Information SSN:  [id number]  Lilly Cove, LCSW

## 2015-11-29 NOTE — Progress Notes (Signed)
Post hd vitals 

## 2015-11-29 NOTE — Plan of Care (Signed)
Problem: Physical Regulation: Goal: Ability to maintain clinical measurements within normal limits will improve Outcome: Not Progressing No urine output noted this shift.  Pt pulled out IVs X 2 this shift. Small amount bleeding noted from sites.  Hbg stable this morning.  No urine output noted this shift.

## 2015-11-29 NOTE — Care Management (Signed)
Informed that patient to discharge back to [facility] after HD treatment today.  Spoke with patient's sister Monico Hoar [phone] regarding a [facility] transfer and facility on diversion.  Betty verbalizes understanding.  She was totally unfamiliar with [facility] and that it had dialysis capabilities.  Notified clinical social work.  Requested DC summary from attending

## 2015-11-29 NOTE — Progress Notes (Signed)
Pre tx assessment  

## 2015-11-29 NOTE — Plan of Care (Signed)
Problem: Education: Goal: Knowledge of [place] [facility] Education information/materials will improve Outcome: Progressing Pt intermittently confused.  Pleasant and cooperative.   Problem: Safety: Goal: Ability to remain free from injury will improve Outcome: Not Progressing Stage II Pressure injury to buttocks present on admission.  Foam dressing and barrier cream applied. Repositioned every 2 hours.

## 2015-11-29 NOTE — Clinical Social Work Note (Signed)
Clinical Social Work Assessment  Patient Details  Name: Gregory Livingston MRN: [id number] Date of Birth: June 28, 1945  Date of referral:  11/29/15               Reason for consult:  Discharge Planning (patient admitted from [facility])                Permission sought to share information with:  Case Manager, Customer service manager, Family Supports Permission granted to share information::  Yes, Verbal Permission Granted  Name::        Agency::  [facility]  Relationship::  daughter  Sport and exercise psychologist Information:     Housing/Transportation Living arrangements for the past 2 months:  [place], [place] of Information:  Patient, Medical Team, Case Manager, Facility, Adult Children Patient Interpreter Needed:  None Criminal Activity/Legal Involvement Pertinent to Current Situation/Hospitalization:  No - Comment as needed Significant Relationships:  Adult Children, Other Family Members, Community Support Lives with:  Facility Resident Do you feel safe going back to the place where you live?  Yes Need for family participation in patient care:  Yes (Comment)  Care giving concerns:  Patient is a resident at [facility], recently placed from the [facility] and on a contract for SNF at [facility] [employer] through the [place]. Patient will return at DC. [facility]: Tues/Thurs/SAt  Daughter verbalizes no concerns per notes. Message has also been left to discuss disposition. [facility] of admission, updated that patient will not be going to the [place] and will possibly discharge back to SNF today.   Social Worker assessment / plan:  Completed assessment with treatment team and with facility. Call placed to daughter. Patient reportedly back to baseline with talking. Plan will be to return to SNF when medically stable.   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  Emotional Assessment Appearance:  Appears stated age Attitude/Demeanor/Rapport:    Affect (typically observed):  Accepting, Adaptable Orientation:  Oriented to Self, Oriented to Place, Oriented to  Time, Oriented to Situation Alcohol / Substance use:  Not Applicable Psych involvement (Current and /or in the community):  No (Comment)  Discharge Needs  Concerns to be addressed:  No discharge needs identified Readmission within the last 30 days:  No Current discharge risk:  None Barriers to Discharge:  Continued Medical Work up   Lilly Cove, LCSW 11/29/2015, 11:29 AM

## 2015-11-29 NOTE — Care Management (Signed)
DC now on hold due to positive blood culture

## 2015-11-29 NOTE — Consult Note (Signed)
[facility]     Reason for Consult: [name]    Referring Physician: GP bacteremia Date of Admission:  11/28/2015   Active Problems:   Hypotension   Pressure injury of skin   HPI: Gregory Livingston is a 70 y.o. male admitted with AMS after missing a dose of HD on 11/27 as well as with increasing sob and hypotension. On admission his wbc was 5 and he was afebrile. He had bcx done and 1/2 are positive for coag negative staph.  He has a hx ESRD and is on HD through RUE AV fistula. 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   Past Medical History:  Diagnosis Date  . AAA (abdominal aortic aneurysm) ([place])   . CHF (congestive heart failure) ([place])   . Diabetes mellitus without complication ([name])   . Dialysis patient ([place])   . Hyperparathyroidism ([place])   . Hypertension   . Multiple myeloma ([place])   . Peptic ulcer with perforation ([place])   . PVD (peripheral vascular disease) ([place])    Past Surgical History:  Procedure Laterality Date  . AV FISTULA PLACEMENT    . BELOW KNEE LEG AMPUTATION Bilateral   . bilateral below knee amputation    . PACEMAKER PLACEMENT     Social History  Substance Use Topics  . Smoking status: Former Research scientist (life sciences)  . Smokeless tobacco: Never Used  . Alcohol use Yes   Family History  Problem Relation Age of Onset  . Hypertension Other     Allergies:  Allergies  Allergen Reactions  . Ivp Dye [Iodinated Diagnostic Agents]     Current antibiotics: Antibiotics Given (last 72 hours)    None      MEDICATIONS: . aspirin  81 mg Oral Daily  . atorvastatin  40 mg Oral QHS  . brimonidine  1 drop Left Eye TID  . cinacalcet  120 mg Oral Q supper  . clopidogrel  75 mg Oral Daily  . feeding supplement (NEPRO CARB STEADY)  237 mL Oral TID  . folic acid  2 mg Oral Daily  . heparin  5,000 Units Subcutaneous Q8H  . pantoprazole  40 mg Oral BID  . prednisoLONE acetate  1 drop Both Eyes BID  . ranolazine  500 mg Oral BID  . senna  2 tablet Oral Daily  .  sevelamer carbonate  2,400 mg Oral TID WC  . sodium chloride flush  3 mL Intravenous Q12H  . vancomycin  2,000 mg Intravenous Once    Review of Systems - 11 systems reviewed and negative per HPI   OBJECTIVE: Temp:  [97.3 F (36.3 C)-98.3 F (36.8 C)] 98 F (36.7 C) (11/28 1310) Pulse Rate:  [55-82] 79 (11/28 1505) Resp:  [7-26] 11 (11/28 1505) BP: (82-134)/(44-72) 97/60 (11/28 1505) SpO2:  [92 %-100 %] 98 % (11/28 1505) Weight:  [86.7 kg (191 lb 1.6 oz)-87.2 kg (192 lb 3.9 oz)] 87.2 kg (192 lb 3.9 oz) (11/28 1310) Physical Exam  Constitutional: He is oriented to person, place, and time. He appears chronically ill but not acutely HENT: anicteric Mouth/Throat: Oropharynx is clear and moist. No oropharyngeal exudate.  Cardiovascular: Normal rate, regular rhythm and normal heart sounds. 2/6 sm Pulmonary/Chest: Effort normal and breath sounds normal. No respiratory distress. He has no wheezes.  Abdominal: Soft. Bowel sounds are normal. He exhibits no distension. There is no tenderness.  Lymphadenopathy: He has no cervical adenopathy.  Neurological: He is alert and oriented to person, place, and time.  Ext Bil BKA sites wnl Skin: Skin is warm and dry. No rash noted. No erythema.  Psychiatric: He has a normal mood and affect. His behavior is normal.  LUE fistula site without warmth tenderness or drainage  LABS: Results for orders placed or performed during the hospital encounter of 11/28/15 (from the past 48 hour(s))  Comprehensive metabolic panel     Status: Abnormal   Collection Time: 11/28/15  2:28 PM  Result Value Ref Range   Sodium 132 (L) 135 - 145 mmol/L   Potassium 4.3 3.5 - 5.1 mmol/L    Comment: HEMOLYSIS AT THIS LEVEL MAY AFFECT RESULT   Chloride 94 (L) 101 - 111 mmol/L   CO2 28 22 - 32 mmol/L   Glucose, Bld 140 (H) 65 - 99 mg/dL   BUN 33 (H) 6 - 20 mg/dL   Creatinine, Ser 3.65 (H) 0.61 - 1.24 mg/dL   Calcium 9.7 8.9 - 10.3 mg/dL   Total Protein 4.9 (L) 6.5 - 8.1  g/dL   Albumin 2.1 (L) 3.5 - 5.0 g/dL   AST 40 15 - 41 U/L    Comment: HEMOLYSIS AT THIS LEVEL MAY AFFECT RESULT   ALT 18 17 - 63 U/L   Alkaline Phosphatase 48 38 - 126 U/L   Total Bilirubin 1.1 0.3 - 1.2 mg/dL   GFR calc non Af Amer 15 (L) >60 mL/min   GFR calc Af Amer 18 (L) >60 mL/min    Comment: (NOTE) The eGFR has been calculated using the CKD EPI equation. This calculation has not been validated in all clinical situations. eGFR's persistently <60 mL/min signify possible Chronic Kidney Disease.    Anion gap 10 5 - 15  CBC WITH DIFFERENTIAL     Status: Abnormal   Collection Time: 11/28/15  2:28 PM  Result Value Ref Range   WBC 5.0 3.8 - 10.6 K/uL   RBC 2.84 (L) 4.40 - 5.90 MIL/uL   Hemoglobin 7.1 (L) 13.0 - 18.0 g/dL   HCT 22.6 (L) 40.0 - 52.0 %   MCV 79.6 (L) 80.0 - 100.0 fL   MCH 24.9 (L) 26.0 - 34.0 pg   MCHC 31.3 (L) 32.0 - 36.0 g/dL   RDW 22.1 (H) 11.5 - 14.5 %   Platelets 135 (L) 150 - 440 K/uL   Neutrophils Relative % 69 %   Neutro Abs 3.5 1.4 - 6.5 K/uL   Lymphocytes Relative 18 %   Lymphs Abs 0.9 (L) 1.0 - 3.6 K/uL   Monocytes Relative 10 %   Monocytes Absolute 0.5 0.2 - 1.0 K/uL   Eosinophils Relative 2 %   Eosinophils Absolute 0.1 0 - 0.7 K/uL   Basophils Relative 1 %   Basophils Absolute 0.0 0 - 0.1 K/uL  Blood Culture (routine x 2)     Status: None (Preliminary result)   Collection Time: 11/28/15  2:28 PM  Result Value Ref Range   Specimen Description BLOOD LEFT ARM    Special Requests BOTTLES DRAWN AEROBIC AND ANAEROBIC 5CC    Culture  Setup Time      Organism ID to follow GRAM POSITIVE COCCI AEROBIC BOTTLE ONLY CRITICAL RESULT CALLED TO, READ BACK BY AND VERIFIED WITH: JASON ROBBINS @ [id number] 11/29/2015 JLJ    Culture GRAM POSITIVE COCCI    Report Status PENDING   Lactic acid, plasma     Status: None   Collection Time: 11/28/15  2:28 PM  Result Value Ref Range   Lactic Acid, Venous 1.1 0.5 - 1.9  mmol/L  Lipase, blood     Status: None   Collection  Time: 11/28/15  2:28 PM  Result Value Ref Range   Lipase 15 11 - 51 U/L  Troponin I     Status: Abnormal   Collection Time: 11/28/15  2:28 PM  Result Value Ref Range   Troponin I 0.09 (HH) <0.03 ng/mL    Comment: CRITICAL RESULT CALLED TO, READ BACK BY AND VERIFIED WITH VALERIE CHANDLER 11/28/15 1515 SGD   APTT     Status: None   Collection Time: 11/28/15  2:28 PM  Result Value Ref Range   aPTT 33 24 - 36 seconds  Protime-INR     Status: None   Collection Time: 11/28/15  2:28 PM  Result Value Ref Range   Prothrombin Time 12.9 11.4 - 15.2 seconds   INR 0.97   Blood Culture ID Panel (Reflexed)     Status: Abnormal   Collection Time: 11/28/15  2:28 PM  Result Value Ref Range   Enterococcus species NOT DETECTED NOT DETECTED   Listeria monocytogenes NOT DETECTED NOT DETECTED   Staphylococcus species DETECTED (A) NOT DETECTED    Comment: CRITICAL RESULT CALLED TO, READ BACK BY AND VERIFIED WITH: JASON ROBBINS @ 0102 11/29/2015 JLJ    Staphylococcus aureus NOT DETECTED NOT DETECTED   Methicillin resistance DETECTED (A) NOT DETECTED    Comment: CRITICAL RESULT CALLED TO, READ BACK BY AND VERIFIED WITH: JASON ROBBINS @ [id number] 11/29/2015 JLJ    Streptococcus species NOT DETECTED NOT DETECTED   Streptococcus agalactiae NOT DETECTED NOT DETECTED   Streptococcus pneumoniae NOT DETECTED NOT DETECTED   Streptococcus pyogenes NOT DETECTED NOT DETECTED   Acinetobacter baumannii NOT DETECTED NOT DETECTED   Enterobacteriaceae species NOT DETECTED NOT DETECTED   Enterobacter cloacae complex NOT DETECTED NOT DETECTED   Escherichia coli NOT DETECTED NOT DETECTED   Klebsiella oxytoca NOT DETECTED NOT DETECTED   Klebsiella pneumoniae NOT DETECTED NOT DETECTED   Proteus species NOT DETECTED NOT DETECTED   Serratia marcescens NOT DETECTED NOT DETECTED   Haemophilus influenzae NOT DETECTED NOT DETECTED   Neisseria meningitidis NOT DETECTED NOT DETECTED   Pseudomonas aeruginosa NOT DETECTED NOT  DETECTED   Candida albicans NOT DETECTED NOT DETECTED   Candida glabrata NOT DETECTED NOT DETECTED   Candida krusei NOT DETECTED NOT DETECTED   Candida parapsilosis NOT DETECTED NOT DETECTED   Candida tropicalis NOT DETECTED NOT DETECTED  Blood gas, venous ([facility], [facility], [facility])     Status: Abnormal   Collection Time: 11/28/15  2:30 PM  Result Value Ref Range   pH, Ven 7.41 7.250 - 7.430   pCO2, Ven 52 44.0 - 60.0 mmHg   pO2, Ven 42.0 32.0 - 45.0 mmHg   Bicarbonate 33.0 (H) 20.0 - 28.0 mmol/L   Acid-Base Excess 7.5 (H) 0.0 - 2.0 mmol/L   O2 Saturation 78.0 %   Patient temperature 37.0    Collection site VEIN    Sample type VENOUS   Blood Culture (routine x 2)     Status: None (Preliminary result)   Collection Time: 11/28/15  2:36 PM  Result Value Ref Range   Specimen Description BLOOD LEFT HAND    Special Requests BOTTLES DRAWN AEROBIC AND ANAEROBIC 5CC    Culture NO GROWTH < 24 HOURS    Report Status PENDING   Type and screen [place]     Status: None   Collection Time: 11/28/15  2:36 PM  Result Value Ref Range  ABO/RH(D) O POS    Antibody Screen NEG    Sample Expiration 12/01/2015   Ammonia     Status: None   Collection Time: 11/28/15  2:36 PM  Result Value Ref Range   Ammonia 19 9 - 35 umol/L    Comment: HEMOLYSIS AT THIS LEVEL MAY AFFECT RESULT  Lactic acid, plasma     Status: None   Collection Time: 11/28/15  7:38 PM  Result Value Ref Range   Lactic Acid, Venous 0.9 0.5 - 1.9 mmol/L  CBC     Status: Abnormal   Collection Time: 11/28/15  7:38 PM  Result Value Ref Range   WBC 5.2 3.8 - 10.6 K/uL   RBC 2.81 (L) 4.40 - 5.90 MIL/uL   Hemoglobin 7.2 (L) 13.0 - 18.0 g/dL   HCT 22.4 (L) 40.0 - 52.0 %   MCV 79.8 (L) 80.0 - 100.0 fL   MCH 25.6 (L) 26.0 - 34.0 pg   MCHC 32.1 32.0 - 36.0 g/dL   RDW 21.8 (H) 11.5 - 14.5 %   Platelets 115 (L) 150 - 440 K/uL  Creatinine, serum     Status: Abnormal   Collection Time: 11/28/15  7:38 PM  Result Value Ref  Range   Creatinine, Ser 3.68 (H) 0.61 - 1.24 mg/dL   GFR calc non Af Amer 15 (L) >60 mL/min   GFR calc Af Amer 18 (L) >60 mL/min    Comment: (NOTE) The eGFR has been calculated using the CKD EPI equation. This calculation has not been validated in all clinical situations. eGFR's persistently <60 mL/min signify possible Chronic Kidney Disease.   Glucose, capillary     Status: Abnormal   Collection Time: 11/28/15 10:12 PM  Result Value Ref Range   Glucose-Capillary 137 (H) 65 - 99 mg/dL  Basic metabolic panel     Status: Abnormal   Collection Time: 11/29/15  4:49 AM  Result Value Ref Range   Sodium 135 135 - 145 mmol/L   Potassium 3.8 3.5 - 5.1 mmol/L   Chloride 96 (L) 101 - 111 mmol/L   CO2 29 22 - 32 mmol/L   Glucose, Bld 103 (H) 65 - 99 mg/dL   BUN 41 (H) 6 - 20 mg/dL   Creatinine, Ser 3.99 (H) 0.61 - 1.24 mg/dL   Calcium 9.6 8.9 - 10.3 mg/dL   GFR calc non Af Amer 14 (L) >60 mL/min   GFR calc Af Amer 16 (L) >60 mL/min    Comment: (NOTE) The eGFR has been calculated using the CKD EPI equation. This calculation has not been validated in all clinical situations. eGFR's persistently <60 mL/min signify possible Chronic Kidney Disease.    Anion gap 10 5 - 15  CBC     Status: Abnormal   Collection Time: 11/29/15  4:49 AM  Result Value Ref Range   WBC 4.6 3.8 - 10.6 K/uL   RBC 3.03 (L) 4.40 - 5.90 MIL/uL   Hemoglobin 7.8 (L) 13.0 - 18.0 g/dL   HCT 24.2 (L) 40.0 - 52.0 %   MCV 79.7 (L) 80.0 - 100.0 fL   MCH 25.7 (L) 26.0 - 34.0 pg   MCHC 32.3 32.0 - 36.0 g/dL   RDW 21.2 (H) 11.5 - 14.5 %   Platelets 120 (L) 150 - 440 K/uL  MRSA PCR Screening     Status: None   Collection Time: 11/29/15  5:10 AM  Result Value Ref Range   MRSA by PCR NEGATIVE NEGATIVE    Comment:  The GeneXpert MRSA Assay (FDA approved for NASAL specimens only), is one component of a comprehensive MRSA colonization surveillance program. It is not intended to diagnose MRSA infection nor to guide  or monitor treatment for MRSA infections.   Phosphorus     Status: None   Collection Time: 11/29/15  1:35 PM  Result Value Ref Range   Phosphorus 3.2 2.5 - 4.6 mg/dL   No components found for: ESR, C REACTIVE PROTEIN MICRO: Recent Results (from the past 720 hour(s))  Blood Culture (routine x 2)     Status: None (Preliminary result)   Collection Time: 11/28/15  2:28 PM  Result Value Ref Range Status   Specimen Description BLOOD LEFT ARM  Final   Special Requests BOTTLES DRAWN AEROBIC AND ANAEROBIC 5CC  Final   Culture  Setup Time   Final    Organism ID to follow GRAM POSITIVE COCCI AEROBIC BOTTLE ONLY CRITICAL RESULT CALLED TO, READ BACK BY AND VERIFIED WITH: JASON ROBBINS @ [id number] 11/29/2015 JLJ    Culture GRAM POSITIVE COCCI  Final   Report Status PENDING  Incomplete  Blood Culture ID Panel (Reflexed)     Status: Abnormal   Collection Time: 11/28/15  2:28 PM  Result Value Ref Range Status   Enterococcus species NOT DETECTED NOT DETECTED Final   Listeria monocytogenes NOT DETECTED NOT DETECTED Final   Staphylococcus species DETECTED (A) NOT DETECTED Final    Comment: CRITICAL RESULT CALLED TO, READ BACK BY AND VERIFIED WITH: JASON ROBBINS @ [id number] 11/29/2015 JLJ    Staphylococcus aureus NOT DETECTED NOT DETECTED Final   Methicillin resistance DETECTED (A) NOT DETECTED Final    Comment: CRITICAL RESULT CALLED TO, READ BACK BY AND VERIFIED WITH: JASON ROBBINS @ [id number] 11/29/2015 JLJ    Streptococcus species NOT DETECTED NOT DETECTED Final   Streptococcus agalactiae NOT DETECTED NOT DETECTED Final   Streptococcus pneumoniae NOT DETECTED NOT DETECTED Final   Streptococcus pyogenes NOT DETECTED NOT DETECTED Final   Acinetobacter baumannii NOT DETECTED NOT DETECTED Final   Enterobacteriaceae species NOT DETECTED NOT DETECTED Final   Enterobacter cloacae complex NOT DETECTED NOT DETECTED Final   Escherichia coli NOT DETECTED NOT DETECTED Final   Klebsiella oxytoca NOT DETECTED NOT  DETECTED Final   Klebsiella pneumoniae NOT DETECTED NOT DETECTED Final   Proteus species NOT DETECTED NOT DETECTED Final   Serratia marcescens NOT DETECTED NOT DETECTED Final   Haemophilus influenzae NOT DETECTED NOT DETECTED Final   Neisseria meningitidis NOT DETECTED NOT DETECTED Final   Pseudomonas aeruginosa NOT DETECTED NOT DETECTED Final   Candida albicans NOT DETECTED NOT DETECTED Final   Candida glabrata NOT DETECTED NOT DETECTED Final   Candida krusei NOT DETECTED NOT DETECTED Final   Candida parapsilosis NOT DETECTED NOT DETECTED Final   Candida tropicalis NOT DETECTED NOT DETECTED Final  Blood Culture (routine x 2)     Status: None (Preliminary result)   Collection Time: 11/28/15  2:36 PM  Result Value Ref Range Status   Specimen Description BLOOD LEFT HAND  Final   Special Requests BOTTLES DRAWN AEROBIC AND ANAEROBIC 5CC  Final   Culture NO GROWTH < 24 HOURS  Final   Report Status PENDING  Incomplete  MRSA PCR Screening     Status: None   Collection Time: 11/29/15  5:10 AM  Result Value Ref Range Status   MRSA by PCR NEGATIVE NEGATIVE Final    Comment:        The GeneXpert MRSA Assay (FDA approved for  NASAL specimens only), is one component of a comprehensive MRSA colonization surveillance program. It is not intended to diagnose MRSA infection nor to guide or monitor treatment for MRSA infections.     IMAGING: Ct Abdomen Pelvis Wo Contrast  Result Date: 11/28/2015 CLINICAL DATA:  Dialysis 2 days ago. Patient presents with acute mental status change and weakness on her right side. Hypotension. EXAM: CT ABDOMEN AND PELVIS WITHOUT CONTRAST TECHNIQUE: Multidetector CT imaging of the abdomen and pelvis was performed following the standard protocol without IV contrast. COMPARISON:  None. FINDINGS: Lower chest: Small bilateral effusions and atelectasis are identified. The distal esophagus is fluid-filled. Coronary artery calcifications are identified. Hepatobiliary:  Previous cholecystectomy. The liver is unremarkable. The portal vein is not assessed without contrast. Pancreas: Unremarkable. No pancreatic ductal dilatation or surrounding inflammatory changes. Spleen: Normal in size without focal abnormality. Adrenals/Urinary Tract: Vascular calcifications are associated with the bilateral kidneys. A small nonobstructive stone is not excluded in the upper right kidney. No ureterectasis or ureteral stones. The left kidney demonstrates a nodular contour and a low-attenuation mass in the anterior upper pole as seen on coronal image 52 and axial image 38 measures up to 3.8 cm with an attenuation of less than 8 Hounsfield units consistent with a cyst. The bladder is decompressed and somewhat thick walled, possibly due to decompression. Mild increased attenuation in the fat around the bladder. Stomach/Bowel: The majority of the small bowel is in the right side of the abdomen consistent with malrotation. No evidence of obstruction. The colon and appendix are unremarkable. Vascular/Lymphatic: Dense atherosclerotic changes seen in the abdominal aorta, iliac vessels, and femoral vessels. An infrarenal abdominal aortic aneurysm is identified measuring 5.6 cm in AP diameter. Slight increased attenuation is seen in the aneurysm sac on series 2, image 54 to the left, likely faint calcification. No adjacent stranding to suggest pending rupture. Reproductive: The patient is status post prostatectomy. Other: Free intraperitoneal air is seen in the right side of the abdomen on series 2, image 26 with a small amount of adjacent fluid. No other free air is identified. A small amount of fluid is seen anteriorly in the abdomen on series 2, image 26. No other free fluid identified. Evidence of previous midline abdominal surgery. Increased attenuation in the subcutaneous and intra-abdominal fat diffusely consist with volume overload. More focal increased attenuation seen in the fat of the anterior  abdomen such as on axial image 45. Musculoskeletal: Degenerative changes most marked at L3 and L4. IMPRESSION: 1. There is a focus of free air in the anterior right abdomen with a small amount of adjacent fluid. Just to the left and inferior to this free air is a small amount of additional fluid and fat stranding. I spoke to the emergency room physician, Dr. Mariea Clonts, who indicated the patient had abdominal surgery to repair a perforated ulcer 8 days ago. The recent surgery explains the free air, fat stranding, and small amount of free fluid. 2. Mild increased attenuation around a decompressed bladder. Recommend correlation with urinalysis to exclude a UTI. 3. Low attenuation left renal mass, consistent with a cyst. 4. Focal abdominal aortic aneurysm measuring up to 5.6 cm. 5. Atherosclerosis of the aorta and branching vessels. 6. Small bilateral pleural effusions and atelectasis. 7. The distal esophagus is fluid-filled, possibly due to reflux. Electronically Signed   By: Dorise Bullion III M.D   On: 11/28/2015 15:30   Ct Head Wo Contrast  Result Date: 11/28/2015 CLINICAL DATA:  69 year old diabetic hypertensive male with  altered mental status. Last dialysis 2 days ago. Initial encounter. EXAM: CT HEAD WITHOUT CONTRAST TECHNIQUE: Contiguous axial images were obtained from the base of the skull through the vertex without intravenous contrast. COMPARISON:  None. FINDINGS: Brain: No intracranial hemorrhage. Remote left frontal lobe infarct with encephalomalacia. Mild to moderate chronic microvascular changes without CT evidence of large acute infarct. Mild moderate global atrophy without hydrocephalus. No intracranial mass lesion noted on this unenhanced exam. Vascular: Vascular calcifications. Skull: No acute abnormality. Sinuses/Orbits: Prior orbital surgery bilaterally. There may be a radiopaque structure within the right globe precluding MR. Correlation with plain films recommended prior to MR imaging.  Visualized paranasal sinuses, mastoid air cells and middle ear cavities are clear. Other: Degenerative changes C1-2 articulation with marked spinal stenosis and cord flattening. IMPRESSION: No intracranial hemorrhage. Remote left frontal lobe infarct. Mild to moderate chronic microvascular changes without CT evidence of large acute infarct. Mild to moderate global atrophy. No intracranial mass lesion noted on this unenhanced exam. Prior orbital surgery bilaterally. There may be a radiopaque structure within the right globe precluding MR. Correlation with plain films recommended prior to MR imaging. Degenerative changes C1-2 articulation with marked spinal stenosis and cord flattening. Electronically Signed   By: Genia Del M.D.   On: 11/28/2015 15:13   Dg Chest Port 1 View  Result Date: 11/28/2015 CLINICAL DATA:  Altered mental status EXAM: PORTABLE CHEST 1 VIEW COMPARISON:  01/27/2015 FINDINGS: Cardiomediastinal silhouette is stable. Dual lead cardiac pacemaker is unchanged in position. Status post CABG. No infiltrate or pulmonary edema. IMPRESSION: No active disease. Electronically Signed   By: Lahoma Crocker M.D.   On: 11/28/2015 16:12   Dg Chest Port 1 View  Result Date: 11/27/2015 CLINICAL DATA:  Shortness of breath and low oxygen saturation. EXAM: PORTABLE CHEST 1 VIEW COMPARISON:  None. FINDINGS: Postoperative changes in the mediastinum. Cardiac pacemaker. Shallow inspiration. Mild cardiac enlargement without vascular congestion. No edema or consolidation in the lungs. No blunting of costophrenic angles. No pneumothorax. Calcification of the aorta. Vascular stent in the subclavian region on the right. IMPRESSION: Shallow inspiration.  No evidence of active pulmonary disease. Electronically Signed   By: Lucienne Capers M.D.   On: 11/27/2015 05:06    Assessment:   Josejuan Hoaglin is a 70 y.o. male admitted with AMS but with no fevers, or leukocytosis and found to have 1/2 BCX + CNS. He has no line  in, gets HD through AVF but does have a PPM and an endovascular stent in R subclavian.  I suspect this will be contaminant given lack of other signs of infection.  I had ordered bcx yesterday and they remain neg but unclear if he received a dose of vanco prior.   Recommendations Continue to hold vanco Check ECHO  If FU bcx neg would continue off abx but have repeat bcx done at HD next visit and then next week.  Thank you very much for allowing me to participate in the care of this patient. Please call with questions.   Cheral Marker. Ola Spurr, MD

## 2015-11-29 NOTE — Progress Notes (Signed)
Nutrition Brief Note  Patient identified on the Malnutrition Screening Tool (MST) Report  Wt Readings from Last 15 Encounters:  11/29/15 192 lb 3.9 oz (87.2 kg)  11/27/15 200 lb (90.7 kg)    Body mass index is 46.35 kg/m. Patient meets criteria for obese class III based on current BMI.   Current diet order is renal w/ 1246mL fluid restriction, patient is consuming approximately 100% of meals at this time. Labs and medications reviewed.   No nutrition interventions warranted at this time. If nutrition issues arise, please consult RD.   Satira Anis. Whalen Trompeter, MS, RD LDN Inpatient Clinical Dietitian Pager [phone]

## 2015-11-30 ENCOUNTER — Inpatient Hospital Stay
Admit: 2015-11-30 | Discharge: 2015-11-30 | Disposition: A | Discharge status: Home / Self Care | Payer: Medicare Other | Attending: Internal Medicine | Admitting: Internal Medicine

## 2015-11-30 LAB — CULTURE, BLOOD (ROUTINE X 2)

## 2015-11-30 LAB — PARATHYROID HORMONE, INTACT (NO CA): PTH: 156 pg/mL — ABNORMAL HIGH (ref 15–65)

## 2015-11-30 LAB — HEPATITIS B SURFACE ANTIGEN: HEP B S AG: NEGATIVE

## 2015-11-30 LAB — HEPATITIS B SURFACE ANTIBODY,QUALITATIVE: Hep B S Ab: REACTIVE

## 2015-11-30 MED ORDER — VANCOMYCIN HCL IN DEXTROSE 1-5 GM/200ML-% IV SOLN
1000.0000 mg | INTRAVENOUS | Status: DC
  Filled 2015-11-30 (×2): qty 200

## 2015-11-30 NOTE — Progress Notes (Signed)
Family Meeting Note  Advance Directive:yes  Today a meeting took place with the Patient.  The following clinical team members were present during this meeting:MD  The following were discussed:Patient's diagnosis: , Patient's progosis: > 12 months and Goals for treatment: Full Code  Additional follow-up to be provided: Palliative care (d/w APP who will see him later)  Time spent during discussion:20 minutes  Max Sane, MD

## 2015-11-30 NOTE — Progress Notes (Signed)
[facility]  ROUNDING NOTE   Subjective:  Patient completed hemodialysis yesterday. He tolerated this quite well. Currently he is awake and alert and appears to be much more responsive than yesterday.   Objective:  Vital signs in last 24 hours:  Temp:  [97.3 F (36.3 C)-98.4 F (36.9 C)] 97.5 F (36.4 C) (11/29 [id number]) Pulse Rate:  [51-96] 51 (11/29 0838) Resp:  [7-18] 18 (11/29 0503) BP: (83-134)/(37-65) 113/37 (11/29 0838) SpO2:  [77 %-100 %] 99 % (11/29 0838) Weight:  [86.3 kg (190 lb 4.1 oz)-87.2 kg (192 lb 3.9 oz)] 86.3 kg (190 lb 4.1 oz) (11/28 1705)  Weight change: 1.017 kg (2 lb 3.9 oz) Filed Weights   11/28/15 2024 11/29/15 1310 11/29/15 1705  Weight: 86.7 kg (191 lb 1.6 oz) 87.2 kg (192 lb 3.9 oz) 86.3 kg (190 lb 4.1 oz)    Intake/Output: I/O last 3 completed shifts: In: 540 [P.O.:480; NG/GT:60] Out: 1000 [Other:1000]   Intake/Output this shift:  No intake/output data recorded.  Physical Exam: General: No acute distress  Head: Normocephalic, atraumatic. Moist oral mucosal membranes  Eyes: Anicteric  Neck: Supple, trachea midline  Lungs:  Clear to auscultation, normal effort  Heart: S1S2 no rubs  Abdomen:  Soft, nontender, Bowel sounds present   Extremities: Bilateral BKA's  Neurologic: Awake, alert, following commands  Skin: No lesions  Access: RUE AVF    Basic Metabolic Panel:  Recent Labs Lab 11/25/15 0605 11/28/15 1428 11/28/15 1938 11/29/15 0449 11/29/15 1335  NA 133* 132*  --  135  --   K 4.4 4.3  --  3.8  --   CL 93* 94*  --  96*  --   CO2 28 28  --  29  --   GLUCOSE 181* 140*  --  103*  --   BUN 38* 33*  --  41*  --   CREATININE 3.54* 3.65* 3.68* 3.99*  --   CALCIUM 10.0 9.7  --  9.6  --   PHOS  --   --   --   --  3.2    Liver Function Tests:  Recent Labs Lab 11/28/15 1428  AST 40  ALT 18  ALKPHOS 48  BILITOT 1.1  PROT 4.9*  ALBUMIN 2.1*    Recent Labs Lab 11/28/15 1428  LIPASE 15    Recent Labs Lab  11/28/15 1436  AMMONIA 19    CBC:  Recent Labs Lab 11/25/15 0605 11/28/15 1428 11/28/15 1938 11/29/15 0449  WBC 9.3 5.0 5.2 4.6  NEUTROABS 7.9* 3.5  --   --   HGB 8.0* 7.1* 7.2* 7.8*  HCT 24.7* 22.6* 22.4* 24.2*  MCV 79.9* 79.6* 79.8* 79.7*  PLT 133* 135* 115* 120*    Cardiac Enzymes:  Recent Labs Lab 11/28/15 1428  TROPONINI 0.09*    BNP: Invalid input(s): POCBNP  CBG:  Recent Labs Lab 11/28/15 2212 11/29/15 2048  GLUCAP 137* 113*    Microbiology: Results for orders placed or performed during the hospital encounter of 11/28/15  Blood Culture (routine x 2)     Status: None (Preliminary result)   Collection Time: 11/28/15  2:28 PM  Result Value Ref Range Status   Specimen Description BLOOD LEFT ARM  Final   Special Requests BOTTLES DRAWN AEROBIC AND ANAEROBIC 5CC  Final   Culture  Setup Time   Final    Organism ID to follow GRAM POSITIVE COCCI AEROBIC BOTTLE ONLY CRITICAL RESULT CALLED TO, READ BACK BY AND VERIFIED WITH: JASON ROBBINS @  1429 11/29/2015 JLJ    Culture GRAM POSITIVE COCCI  Final   Report Status PENDING  Incomplete  Blood Culture ID Panel (Reflexed)     Status: Abnormal   Collection Time: 11/28/15  2:28 PM  Result Value Ref Range Status   Enterococcus species NOT DETECTED NOT DETECTED Final   Listeria monocytogenes NOT DETECTED NOT DETECTED Final   Staphylococcus species DETECTED (A) NOT DETECTED Final    Comment: CRITICAL RESULT CALLED TO, READ BACK BY AND VERIFIED WITH: JASON ROBBINS @ 1429 11/29/2015 JLJ    Staphylococcus aureus NOT DETECTED NOT DETECTED Final   Methicillin resistance DETECTED (A) NOT DETECTED Final    Comment: CRITICAL RESULT CALLED TO, READ BACK BY AND VERIFIED WITH: JASON ROBBINS @ [id number] 11/29/2015 JLJ    Streptococcus species NOT DETECTED NOT DETECTED Final   Streptococcus agalactiae NOT DETECTED NOT DETECTED Final   Streptococcus pneumoniae NOT DETECTED NOT DETECTED Final   Streptococcus pyogenes NOT DETECTED  NOT DETECTED Final   Acinetobacter baumannii NOT DETECTED NOT DETECTED Final   Enterobacteriaceae species NOT DETECTED NOT DETECTED Final   Enterobacter cloacae complex NOT DETECTED NOT DETECTED Final   Escherichia coli NOT DETECTED NOT DETECTED Final   Klebsiella oxytoca NOT DETECTED NOT DETECTED Final   Klebsiella pneumoniae NOT DETECTED NOT DETECTED Final   Proteus species NOT DETECTED NOT DETECTED Final   Serratia marcescens NOT DETECTED NOT DETECTED Final   Haemophilus influenzae NOT DETECTED NOT DETECTED Final   Neisseria meningitidis NOT DETECTED NOT DETECTED Final   Pseudomonas aeruginosa NOT DETECTED NOT DETECTED Final   Candida albicans NOT DETECTED NOT DETECTED Final   Candida glabrata NOT DETECTED NOT DETECTED Final   Candida krusei NOT DETECTED NOT DETECTED Final   Candida parapsilosis NOT DETECTED NOT DETECTED Final   Candida tropicalis NOT DETECTED NOT DETECTED Final  Blood Culture (routine x 2)     Status: None (Preliminary result)   Collection Time: 11/28/15  2:36 PM  Result Value Ref Range Status   Specimen Description BLOOD LEFT HAND  Final   Special Requests BOTTLES DRAWN AEROBIC AND ANAEROBIC 5CC  Final   Culture NO GROWTH 2 DAYS  Final   Report Status PENDING  Incomplete  MRSA PCR Screening     Status: None   Collection Time: 11/29/15  5:10 AM  Result Value Ref Range Status   MRSA by PCR NEGATIVE NEGATIVE Final    Comment:        The GeneXpert MRSA Assay (FDA approved for NASAL specimens only), is one component of a comprehensive MRSA colonization surveillance program. It is not intended to diagnose MRSA infection nor to guide or monitor treatment for MRSA infections.   Culture, blood (Routine X 2) w Reflex to ID Panel     Status: None (Preliminary result)   Collection Time: 11/29/15  7:41 PM  Result Value Ref Range Status   Specimen Description BLOOD LEFT AC  Final   Special Requests BOTTLES DRAWN AEROBIC AND ANAEROBIC ANA7CC AER12CC  Final   Culture  NO GROWTH < 12 HOURS  Final   Report Status PENDING  Incomplete  Culture, blood (Routine X 2) w Reflex to ID Panel     Status: None (Preliminary result)   Collection Time: 11/29/15  7:41 PM  Result Value Ref Range Status   Specimen Description BLOOD LT HAND  Final   Special Requests BOTTLES DRAWN AEROBIC AND ANAEROBIC 4CC  Final   Culture NO GROWTH < 12 HOURS  Final   Report  Status PENDING  Incomplete    Coagulation Studies:  Recent Labs  11/28/15 1428  LABPROT 12.9  INR 0.97    Urinalysis: No results for input(s): COLORURINE, LABSPEC, PHURINE, GLUCOSEU, HGBUR, BILIRUBINUR, KETONESUR, PROTEINUR, UROBILINOGEN, NITRITE, LEUKOCYTESUR in the last 72 hours.  Invalid input(s): APPERANCEUR    Imaging: Ct Abdomen Pelvis Wo Contrast  Result Date: 11/28/2015 CLINICAL DATA:  Dialysis 2 days ago. Patient presents with acute mental status change and weakness on her right side. Hypotension. EXAM: CT ABDOMEN AND PELVIS WITHOUT CONTRAST TECHNIQUE: Multidetector CT imaging of the abdomen and pelvis was performed following the standard protocol without IV contrast. COMPARISON:  None. FINDINGS: Lower chest: Small bilateral effusions and atelectasis are identified. The distal esophagus is fluid-filled. Coronary artery calcifications are identified. Hepatobiliary: Previous cholecystectomy. The liver is unremarkable. The portal vein is not assessed without contrast. Pancreas: Unremarkable. No pancreatic ductal dilatation or surrounding inflammatory changes. Spleen: Normal in size without focal abnormality. Adrenals/Urinary Tract: Vascular calcifications are associated with the bilateral kidneys. A small nonobstructive stone is not excluded in the upper right kidney. No ureterectasis or ureteral stones. The left kidney demonstrates a nodular contour and a low-attenuation mass in the anterior upper pole as seen on coronal image 52 and axial image 38 measures up to 3.8 cm with an attenuation of less than 8  Hounsfield units consistent with a cyst. The bladder is decompressed and somewhat thick walled, possibly due to decompression. Mild increased attenuation in the fat around the bladder. Stomach/Bowel: The majority of the small bowel is in the right side of the abdomen consistent with malrotation. No evidence of obstruction. The colon and appendix are unremarkable. Vascular/Lymphatic: Dense atherosclerotic changes seen in the abdominal aorta, iliac vessels, and femoral vessels. An infrarenal abdominal aortic aneurysm is identified measuring 5.6 cm in AP diameter. Slight increased attenuation is seen in the aneurysm sac on series 2, image 54 to the left, likely faint calcification. No adjacent stranding to suggest pending rupture. Reproductive: The patient is status post prostatectomy. Other: Free intraperitoneal air is seen in the right side of the abdomen on series 2, image 26 with a small amount of adjacent fluid. No other free air is identified. A small amount of fluid is seen anteriorly in the abdomen on series 2, image 26. No other free fluid identified. Evidence of previous midline abdominal surgery. Increased attenuation in the subcutaneous and intra-abdominal fat diffusely consist with volume overload. More focal increased attenuation seen in the fat of the anterior abdomen such as on axial image 45. Musculoskeletal: Degenerative changes most marked at L3 and L4. IMPRESSION: 1. There is a focus of free air in the anterior right abdomen with a small amount of adjacent fluid. Just to the left and inferior to this free air is a small amount of additional fluid and fat stranding. I spoke to the emergency room physician, Dr. Mariea Clonts, who indicated the patient had abdominal surgery to repair a perforated ulcer 8 days ago. The recent surgery explains the free air, fat stranding, and small amount of free fluid. 2. Mild increased attenuation around a decompressed bladder. Recommend correlation with urinalysis to exclude  a UTI. 3. Low attenuation left renal mass, consistent with a cyst. 4. Focal abdominal aortic aneurysm measuring up to 5.6 cm. 5. Atherosclerosis of the aorta and branching vessels. 6. Small bilateral pleural effusions and atelectasis. 7. The distal esophagus is fluid-filled, possibly due to reflux. Electronically Signed   By: Dorise Bullion III M.D   On: 11/28/2015 15:30  Ct Head Wo Contrast  Result Date: 11/28/2015 CLINICAL DATA:  70 year old diabetic hypertensive male with altered mental status. Last dialysis 2 days ago. Initial encounter. EXAM: CT HEAD WITHOUT CONTRAST TECHNIQUE: Contiguous axial images were obtained from the base of the skull through the vertex without intravenous contrast. COMPARISON:  None. FINDINGS: Brain: No intracranial hemorrhage. Remote left frontal lobe infarct with encephalomalacia. Mild to moderate chronic microvascular changes without CT evidence of large acute infarct. Mild moderate global atrophy without hydrocephalus. No intracranial mass lesion noted on this unenhanced exam. Vascular: Vascular calcifications. Skull: No acute abnormality. Sinuses/Orbits: Prior orbital surgery bilaterally. There may be a radiopaque structure within the right globe precluding MR. Correlation with plain films recommended prior to MR imaging. Visualized paranasal sinuses, mastoid air cells and middle ear cavities are clear. Other: Degenerative changes C1-2 articulation with marked spinal stenosis and cord flattening. IMPRESSION: No intracranial hemorrhage. Remote left frontal lobe infarct. Mild to moderate chronic microvascular changes without CT evidence of large acute infarct. Mild to moderate global atrophy. No intracranial mass lesion noted on this unenhanced exam. Prior orbital surgery bilaterally. There may be a radiopaque structure within the right globe precluding MR. Correlation with plain films recommended prior to MR imaging. Degenerative changes C1-2 articulation with marked spinal  stenosis and cord flattening. Electronically Signed   By: Genia Del M.D.   On: 11/28/2015 15:13   Dg Chest Port 1 View  Result Date: 11/28/2015 CLINICAL DATA:  Altered mental status EXAM: PORTABLE CHEST 1 VIEW COMPARISON:  01/27/2015 FINDINGS: Cardiomediastinal silhouette is stable. Dual lead cardiac pacemaker is unchanged in position. Status post CABG. No infiltrate or pulmonary edema. IMPRESSION: No active disease. Electronically Signed   By: Lahoma Crocker M.D.   On: 11/28/2015 16:12     Medications:    . aspirin  81 mg Oral Daily  . atorvastatin  40 mg Oral QHS  . brimonidine  1 drop Left Eye TID  . cinacalcet  120 mg Oral Q supper  . clopidogrel  75 mg Oral Daily  . feeding supplement (NEPRO CARB STEADY)  237 mL Oral TID  . folic acid  2 mg Oral Daily  . heparin  5,000 Units Subcutaneous Q8H  . pantoprazole  40 mg Oral BID  . prednisoLONE acetate  1 drop Both Eyes BID  . ranolazine  500 mg Oral BID  . senna  2 tablet Oral Daily  . sevelamer carbonate  2,400 mg Oral TID WC  . sodium chloride flush  3 mL Intravenous Q12H   sodium chloride, sodium chloride, acetaminophen **OR** acetaminophen, albuterol, alteplase, heparin, lidocaine (PF), lidocaine-prilocaine, ondansetron **OR** ondansetron (ZOFRAN) IV, oxyCODONE, pentafluoroprop-tetrafluoroeth, polyvinyl alcohol  Assessment/ Plan:  69 y.o. male with a PMHx of ESRD on HD at the [facility], anemia chronic kidney disease, secondary hyperparathyroidism, aortic aneurysm, bilateral below the knee amputation, recent peptic ulcer repair, who was admitted to [facility] on 11/28/2015 for evaluation of hypoxia.    1.  ESRD on HD TTS at the [facility]:  The patient is currently residing at a nursing home here in [place] however it appears that he's being transported to dialysis 3 times a week at the [facility].   -  Patient completed hemodialysis yesterday. No acute indication for dialysis today. We will plan for hemodialysis again  tomorrow.  2.  Anemia chronic kidney disease.   hemoglobin currently 7.8. We will consider adding Epogen tomorrow with dialysis.  3.  Secondary hyperparathyroidism.   PTH 156 and phosphorus 3.2. -  Continue current doses of Sensipar and Renvela.  4.  Positive blood cultures.  Could potentially represent contaminant as only 1 bottle showing growth in the aerobic bottle. Further decision per hospitalist.   LOS: 2 Gregory Livingston 11/29/201711:31 AM

## 2015-11-30 NOTE — Progress Notes (Signed)
[facility] [place] NAME: Gregory Livingston    MR#:  [id number]  DATE OF BIRTH:  05/24/1945  SUBJECTIVE:  Feels ok, pleasantly confused. Thinks he is in [place], year is 2017. No new complaints, BP improved today REVIEW OF SYSTEMS:   Review of Systems  Constitutional: Negative for chills, fever and weight loss.  HENT: Negative for nosebleeds and sore throat.   Eyes: Negative for blurred vision.  Respiratory: Negative for cough, shortness of breath and wheezing.   Cardiovascular: Negative for chest pain, orthopnea, leg swelling and PND.  Gastrointestinal: Negative for abdominal pain, constipation, diarrhea, heartburn, nausea and vomiting.  Genitourinary: Negative for dysuria and urgency.  Musculoskeletal: Negative for back pain.  Skin: Negative for rash.  Neurological: Negative for dizziness, speech change, focal weakness and headaches.  Endo/Heme/Allergies: Does not bruise/bleed easily.  Psychiatric/Behavioral: Positive for memory loss. Negative for depression.   Tolerating Diet: yes Tolerating PT: not attempted (resident of [facility]) DRUG ALLERGIES:   Allergies  Allergen Reactions  . Ivp Dye [Iodinated Diagnostic Agents]    VITALS:  Blood pressure (!) 113/37, pulse (!) 51, temperature 97.5 F (36.4 C), temperature source Oral, resp. rate 18, height 4\' 6"  (1.372 m), weight 86.3 kg (190 lb 4.1 oz), SpO2 99 %.  PHYSICAL EXAMINATION:   Physical Exam  GENERAL:  70 y.o.-year-old patient lying in the bed with no acute distress.  EYES: Pupils equal, round, reactive to light and accommodation. No scleral icterus. Extraocular muscles intact.  HEENT: Head atraumatic, normocephalic. Oropharynx and nasopharynx clear.  NECK:  Supple, no jugular venous distention. No thyroid enlargement, no tenderness.  LUNGS: Normal breath sounds bilaterally, no wheezing, rales, rhonchi. No use of accessory muscles of respiration.  CARDIOVASCULAR: S1, S2 normal.  No murmurs, rubs, or gallops.  ABDOMEN: Soft, nontender, nondistended. Bowel sounds present. No organomegaly or mass.  EXTREMITIES: No cyanosis, clubbing or edema b/l.    NEUROLOGIC: Cranial nerves II through XII are intact. No focal Motor or sensory deficits b/l.  pleasantly confused. Thinks he is in [place], year is 2017. PSYCHIATRIC:  Mood good.  SKIN: No obvious rash, lesion, or ulcer.  LABORATORY PANEL:  CBC  Recent Labs Lab 11/29/15 0449  WBC 4.6  HGB 7.8*  HCT 24.2*  PLT 120*    Chemistries   Recent Labs Lab 11/28/15 1428  11/29/15 0449  NA 132*  --  135  K 4.3  --  3.8  CL 94*  --  96*  CO2 28  --  29  GLUCOSE 140*  --  103*  BUN 33*  --  41*  CREATININE 3.65*  < > 3.99*  CALCIUM 9.7  --  9.6  AST 40  --   --   ALT 18  --   --   ALKPHOS 48  --   --   BILITOT 1.1  --   --   < > = values in this interval not displayed. Cardiac Enzymes  Recent Labs Lab 11/28/15 1428  TROPONINI 0.09*   RADIOLOGY:  Ct Abdomen Pelvis Wo Contrast  Result Date: 11/28/2015 CLINICAL DATA:  Dialysis 2 days ago. Patient presents with acute mental status change and weakness on her right side. Hypotension. EXAM: CT ABDOMEN AND PELVIS WITHOUT CONTRAST TECHNIQUE: Multidetector CT imaging of the abdomen and pelvis was performed following the standard protocol without IV contrast. COMPARISON:  None. FINDINGS: Lower chest: Small bilateral effusions and atelectasis are identified. The distal esophagus is fluid-filled. Coronary artery  calcifications are identified. Hepatobiliary: Previous cholecystectomy. The liver is unremarkable. The portal vein is not assessed without contrast. Pancreas: Unremarkable. No pancreatic ductal dilatation or surrounding inflammatory changes. Spleen: Normal in size without focal abnormality. Adrenals/Urinary Tract: Vascular calcifications are associated with the bilateral kidneys. A small nonobstructive stone is not excluded in the upper right kidney. No ureterectasis  or ureteral stones. The left kidney demonstrates a nodular contour and a low-attenuation mass in the anterior upper pole as seen on coronal image 52 and axial image 38 measures up to 3.8 cm with an attenuation of less than 8 Hounsfield units consistent with a cyst. The bladder is decompressed and somewhat thick walled, possibly due to decompression. Mild increased attenuation in the fat around the bladder. Stomach/Bowel: The majority of the small bowel is in the right side of the abdomen consistent with malrotation. No evidence of obstruction. The colon and appendix are unremarkable. Vascular/Lymphatic: Dense atherosclerotic changes seen in the abdominal aorta, iliac vessels, and femoral vessels. An infrarenal abdominal aortic aneurysm is identified measuring 5.6 cm in AP diameter. Slight increased attenuation is seen in the aneurysm sac on series 2, image 54 to the left, likely faint calcification. No adjacent stranding to suggest pending rupture. Reproductive: The patient is status post prostatectomy. Other: Free intraperitoneal air is seen in the right side of the abdomen on series 2, image 26 with a small amount of adjacent fluid. No other free air is identified. A small amount of fluid is seen anteriorly in the abdomen on series 2, image 26. No other free fluid identified. Evidence of previous midline abdominal surgery. Increased attenuation in the subcutaneous and intra-abdominal fat diffusely consist with volume overload. More focal increased attenuation seen in the fat of the anterior abdomen such as on axial image 45. Musculoskeletal: Degenerative changes most marked at L3 and L4. IMPRESSION: 1. There is a focus of free air in the anterior right abdomen with a small amount of adjacent fluid. Just to the left and inferior to this free air is a small amount of additional fluid and fat stranding. I spoke to the emergency room physician, Dr. Mariea Clonts, who indicated the patient had abdominal surgery to repair a  perforated ulcer 8 days ago. The recent surgery explains the free air, fat stranding, and small amount of free fluid. 2. Mild increased attenuation around a decompressed bladder. Recommend correlation with urinalysis to exclude a UTI. 3. Low attenuation left renal mass, consistent with a cyst. 4. Focal abdominal aortic aneurysm measuring up to 5.6 cm. 5. Atherosclerosis of the aorta and branching vessels. 6. Small bilateral pleural effusions and atelectasis. 7. The distal esophagus is fluid-filled, possibly due to reflux. Electronically Signed   By: Dorise Bullion III M.D   On: 11/28/2015 15:30   Ct Head Wo Contrast  Result Date: 11/28/2015 CLINICAL DATA:  70 year old diabetic hypertensive male with altered mental status. Last dialysis 2 days ago. Initial encounter. EXAM: CT HEAD WITHOUT CONTRAST TECHNIQUE: Contiguous axial images were obtained from the base of the skull through the vertex without intravenous contrast. COMPARISON:  None. FINDINGS: Brain: No intracranial hemorrhage. Remote left frontal lobe infarct with encephalomalacia. Mild to moderate chronic microvascular changes without CT evidence of large acute infarct. Mild moderate global atrophy without hydrocephalus. No intracranial mass lesion noted on this unenhanced exam. Vascular: Vascular calcifications. Skull: No acute abnormality. Sinuses/Orbits: Prior orbital surgery bilaterally. There may be a radiopaque structure within the right globe precluding MR. Correlation with plain films recommended prior to MR imaging. Visualized  paranasal sinuses, mastoid air cells and middle ear cavities are clear. Other: Degenerative changes C1-2 articulation with marked spinal stenosis and cord flattening. IMPRESSION: No intracranial hemorrhage. Remote left frontal lobe infarct. Mild to moderate chronic microvascular changes without CT evidence of large acute infarct. Mild to moderate global atrophy. No intracranial mass lesion noted on this unenhanced exam.  Prior orbital surgery bilaterally. There may be a radiopaque structure within the right globe precluding MR. Correlation with plain films recommended prior to MR imaging. Degenerative changes C1-2 articulation with marked spinal stenosis and cord flattening. Electronically Signed   By: Genia Del M.D.   On: 11/28/2015 15:13   Dg Chest Port 1 View  Result Date: 11/28/2015 CLINICAL DATA:  Altered mental status EXAM: PORTABLE CHEST 1 VIEW COMPARISON:  01/27/2015 FINDINGS: Cardiomediastinal silhouette is stable. Dual lead cardiac pacemaker is unchanged in position. Status post CABG. No infiltrate or pulmonary edema. IMPRESSION: No active disease. Electronically Signed   By: Lahoma Crocker M.D.   On: 11/28/2015 16:12   ASSESSMENT AND PLAN:  70 y.o. male with a PMHx of ESRD on HD at the [facility], anemia chronic kidney disease, secondary hyperparathyroidism, aortic aneurysm, bilateral below the knee amputation, recent peptic ulcer repair, who was admitted to [facility] on 11/28/2015 for evaluation of hypoxia.  The patient is on continuous oxygen at the nursing home at [facility].   Patient is a poor historian unable to offer significant history  1. Hypotension: intermittent. Considering his h/o bacteremia in past ventricular tachycardia s/p PPM (removed 2015 after bacteremia with pocket infection w replacement) His initial blood c/s grew staph species (could be contaminant) - ID c/s, echo, repeat blood c/s -mentation appears to be at baseline -no fever -resume BP meds before D/c -1/2 BC positive for staph species.  2. Acute encephalopathy: Resolved patient at baseline per family  3. Microcytic anemia: Follow CBC transfuse hemoglobin less than 7  4. Chronic heart failure unspecified ejection fraction: Hold imdur, echo today  5. End-stage renal disease on dialysis consult nephrology for continuation of dialysis  No indication for transfer to [place] since pt improving and [facility] is on [place] [phone] x [phone] -spoke with her this morning   Left message to his dter on the phone Latricia Heft at [phone]  Case discussed with Care Management/Social Worker. Management plans discussed with the patient, nursing and they are in agreement.  CODE STATUS: full  DVT Prophylaxis: heparin TOTAL TIME TAKING CARE OF THIS PATIENT: 30 minutes.  >50% time spent on counselling and coordination of care  POSSIBLE D/C IN 1-2 DAYS, DEPENDING ON CLINICAL CONDITION. And ID eval  Note: This dictation was prepared with Dragon dictation along with smaller phrase technology. Any transcriptional errors that result from this process are unintentional.  Max Sane M.D on 11/30/2015 at 9:10 AM  Between 7am to 6pm - Pager - [phone]  After 6pm go to [email or web address] - password [email or web address] [facility]  Office  [phone]  CC: Primary care physician; [facility]

## 2015-11-30 NOTE — Plan of Care (Signed)
Problem: Physical Regulation: Goal: Ability to maintain clinical measurements within normal limits will improve Outcome: Not Progressing SBP continues to run <100.  Pt alert, intermittently confused and yelling out.  Complaints of pain to buttocks earlier.  Barrier cream and foam dressing applied to Stage II Pressure ulcers.

## 2015-11-30 NOTE — Progress Notes (Signed)
Palliative:  Palliative care has received consult. Will see patient tomorrow 12/01/15. We apologize for the delay.  Vinie Sill, NP Palliative Medicine Team Pager # [phone] (M-F 8a-5p) Team Phone # [phone] (Nights/Weekends)

## 2015-11-30 NOTE — Progress Notes (Signed)
Pt refused heparin injection doses this shift.  Educated on rationale for med  for blood clot prevention.  Pt reports I"m just going to take my Plavix.  That's too much of the same thing.

## 2015-11-30 NOTE — Progress Notes (Signed)
*  PRELIMINARY RESULTS* Echocardiogram 2D Echocardiogram has been performed.  Gregory Livingston 11/30/2015, 2:24 PM

## 2015-12-01 LAB — BASIC METABOLIC PANEL
Anion gap: 7 (ref 5–15)
BUN: 27 mg/dL — AB (ref 6–20)
CHLORIDE: 96 mmol/L — AB (ref 101–111)
CO2: 32 mmol/L (ref 22–32)
CREATININE: 3.34 mg/dL — AB (ref 0.61–1.24)
Calcium: 8.6 mg/dL — ABNORMAL LOW (ref 8.9–10.3)
GFR calc Af Amer: 20 mL/min — ABNORMAL LOW (ref >60)
GFR calc non Af Amer: 17 mL/min — ABNORMAL LOW (ref >60)
GLUCOSE: 128 mg/dL — AB (ref 65–99)
Potassium: 3.2 mmol/L — ABNORMAL LOW (ref 3.5–5.1)
Sodium: 135 mmol/L (ref 135–145)

## 2015-12-01 LAB — CBC
HEMATOCRIT: 24.9 % — AB (ref 40.0–52.0)
HEMOGLOBIN: 8.2 g/dL — AB (ref 13.0–18.0)
MCH: 26.4 pg (ref 26.0–34.0)
MCHC: 32.8 g/dL (ref 32.0–36.0)
MCV: 80.5 fL (ref 80.0–100.0)
Platelets: 134 10*3/uL — ABNORMAL LOW (ref 150–440)
RBC: 3.1 MIL/uL — ABNORMAL LOW (ref 4.40–5.90)
RDW: 21.2 % — ABNORMAL HIGH (ref 11.5–14.5)
WBC: 5.4 10*3/uL (ref 3.8–10.6)

## 2015-12-01 LAB — ECHOCARDIOGRAM COMPLETE
Height: 54 in
Weight: 3044.11 oz

## 2015-12-01 LAB — PHOSPHORUS: PHOSPHORUS: 2.1 mg/dL — AB (ref 2.5–4.6)

## 2015-12-01 MED ORDER — NITROGLYCERIN 0.4 MG SL SUBL
0.4000 mg | SUBLINGUAL_TABLET | SUBLINGUAL | Status: DC | PRN
  Filled 2015-12-01: qty 25

## 2015-12-01 MED ORDER — POTASSIUM CHLORIDE CRYS ER 20 MEQ PO TBCR
40.0000 meq | EXTENDED_RELEASE_TABLET | Freq: Once | ORAL | Status: AC
  Administered 2015-12-01: 40 meq via ORAL
  Filled 2015-12-01: qty 2

## 2015-12-01 MED ORDER — EPOETIN ALFA 10000 UNIT/ML IJ SOLN
10000.0000 [IU] | INTRAMUSCULAR | Status: DC
  Administered 2015-12-01: 10000 [IU] via INTRAVENOUS

## 2015-12-01 NOTE — Progress Notes (Signed)
[facility] at [place] NAME: Gregory Livingston    MR#:  [id number]  DATE OF BIRTH:  September 18, 1945  SUBJECTIVE:  No new issues, bp stable REVIEW OF SYSTEMS:   Review of Systems  Constitutional: Negative for chills, fever and weight loss.  HENT: Negative for nosebleeds and sore throat.   Eyes: Negative for blurred vision.  Respiratory: Negative for cough, shortness of breath and wheezing.   Cardiovascular: Negative for chest pain, orthopnea, leg swelling and PND.  Gastrointestinal: Negative for abdominal pain, constipation, diarrhea, heartburn, nausea and vomiting.  Genitourinary: Negative for dysuria and urgency.  Musculoskeletal: Negative for back pain.  Skin: Negative for rash.  Neurological: Negative for dizziness, speech change, focal weakness and headaches.  Endo/Heme/Allergies: Does not bruise/bleed easily.  Psychiatric/Behavioral: Positive for memory loss. Negative for depression.   Tolerating Diet: yes Tolerating PT: not attempted (resident of [facility]) DRUG ALLERGIES:   Allergies  Allergen Reactions  . Ivp Dye [Iodinated Diagnostic Agents]    VITALS:  Blood pressure (!) 100/56, pulse 91, temperature 97.3 F (36.3 C), resp. rate 14, height 4\' 6"  (1.372 m), weight 88 kg (193 lb 14.4 oz), SpO2 90 %.  PHYSICAL EXAMINATION:   Physical Exam  GENERAL:  70 y.o.-year-old patient lying in the bed with no acute distress.  EYES: Pupils equal, round, reactive to light and accommodation. No scleral icterus. Extraocular muscles intact.  HEENT: Head atraumatic, normocephalic. Oropharynx and nasopharynx clear.  NECK:  Supple, no jugular venous distention. No thyroid enlargement, no tenderness.  LUNGS: Normal breath sounds bilaterally, no wheezing, rales, rhonchi. No use of accessory muscles of respiration.  CARDIOVASCULAR: S1, S2 normal. No murmurs, rubs, or gallops.  ABDOMEN: Soft, nontender, nondistended. Bowel sounds present. No organomegaly  or mass.  EXTREMITIES: No cyanosis, clubbing or edema b/l.    NEUROLOGIC: Cranial nerves II through XII are intact. No focal Motor or sensory deficits b/l.  pleasantly confused. Thinks he is in [place], year is 2017. PSYCHIATRIC:  Mood good.  SKIN: No obvious rash, lesion, or ulcer.  LABORATORY PANEL:  CBC  Recent Labs Lab 12/01/15 0459  WBC 5.4  HGB 8.2*  HCT 24.9*  PLT 134*    Chemistries   Recent Labs Lab 11/28/15 1428  12/01/15 0459  NA 132*  < > 135  K 4.3  < > 3.2*  CL 94*  < > 96*  CO2 28  < > 32  GLUCOSE 140*  < > 128*  BUN 33*  < > 27*  CREATININE 3.65*  < > 3.34*  CALCIUM 9.7  < > 8.6*  AST 40  --   --   ALT 18  --   --   ALKPHOS 48  --   --   BILITOT 1.1  --   --   < > = values in this interval not displayed. Cardiac Enzymes  Recent Labs Lab 11/28/15 1428  TROPONINI 0.09*   RADIOLOGY:  No results found. ASSESSMENT AND PLAN:  70 y.o. male with a PMHx of ESRD on HD at the [facility], anemia chronic kidney disease, secondary hyperparathyroidism, aortic aneurysm, bilateral below the knee amputation, recent peptic ulcer repair, who was admitted to [facility] on 11/28/2015 for evaluation of hypoxia.  The patient is on continuous oxygen at the nursing home at [facility].   Patient is a poor historian unable to offer significant history  1. Hypotension: intermittent.  - s/p HD earlier. - BP holding well since last 24  hrs. - repeat blood c/s neg, likely initial one false +  2. Acute encephalopathy: Resolved patient at baseline per family  3. Microcytic anemia: Follow CBC transfuse hemoglobin less than 7  4. Chronic heart failure unspecified ejection fraction: Hold imdur, echo today  5. End-stage renal disease on dialysis consult nephrology for continuation of dialysis  6. Hypokalemia: replete and recheck  No indication for transfer to [place] since pt improving and [facility] is on [place] [phone] x [phone] -spoke with her this  morning   Left message to his dter on the phone Latricia Heft at [phone]  Case discussed with Care Management/Social Worker. Management plans discussed with the patient, nursing and they are in agreement.  CODE STATUS: full  DVT Prophylaxis: heparin TOTAL TIME TAKING CARE OF THIS PATIENT: 30 minutes.  >50% time spent on counselling and coordination of care  POSSIBLE D/C IN AM Back to [facility], DEPENDING ON CLINICAL CONDITION.   Note: This dictation was prepared with Dragon dictation along with smaller phrase technology. Any transcriptional errors that result from this process are unintentional.  Max Sane M.D on 12/01/2015 at 5:17 PM  Between 7am to 6pm - Pager - [phone]  After 6pm go to [email or web address] - password [email or web address] [facility]  Office  [phone]  CC: Primary care physician; [facility]

## 2015-12-01 NOTE — Care Management (Signed)
Updated [name] at the [facility] that attending anticipates patient will be stable for discharge back to his skilled nursing facility within next 24-48 hours.  At this time, there would not no reason to transfer to the [facility]

## 2015-12-01 NOTE — Progress Notes (Signed)
[facility]  ROUNDING NOTE   Subjective:  Patient seen and evaluated during hemodialysis. He is resting comfortably at the moment. He appears to be tolerating the dialysis treatment well.    Objective:  Vital signs in last 24 hours:  Temp:  [97.5 F (36.4 C)-98 F (36.7 C)] 97.5 F (36.4 C) (11/30 0725) Pulse Rate:  [45-99] 99 (11/30 1246) Resp:  [13-20] 18 (11/30 1246) BP: (79-130)/(51-61) 92/58 (11/30 1247) SpO2:  [84 %-93 %] 93 % (11/30 0953) Weight:  [83.4 kg (183 lb 12.8 oz)-88.2 kg (194 lb 7.1 oz)] 88.2 kg (194 lb 7.1 oz) (11/30 0953)  Weight change:  Filed Weights   11/29/15 1705 12/01/15 0725 12/01/15 0953  Weight: 86.3 kg (190 lb 4.1 oz) 83.4 kg (183 lb 12.8 oz) 88.2 kg (194 lb 7.1 oz)    Intake/Output: I/O last 3 completed shifts: In: 160 [P.O.:100; NG/GT:60] Out: 0    Intake/Output this shift:  Total I/O In: 240 [P.O.:240] Out: -   Physical Exam: General: No acute distress  Head: Normocephalic, atraumatic. Moist oral mucosal membranes  Eyes: Anicteric  Neck: Supple, trachea midline  Lungs:  Clear to auscultation, normal effort  Heart: S1S2 no rubs  Abdomen:  Soft, nontender, Bowel sounds present   Extremities: Bilateral BKA's  Neurologic: Awake, alert, following commands  Skin: No lesions  Access: RUE AVF    Basic Metabolic Panel:  Recent Labs Lab 11/25/15 0605 11/28/15 1428 11/28/15 1938 11/29/15 0449 11/29/15 1335 12/01/15 0459 12/01/15 1000  NA 133* 132*  --  135  --  135  --   K 4.4 4.3  --  3.8  --  3.2*  --   CL 93* 94*  --  96*  --  96*  --   CO2 28 28  --  29  --  32  --   GLUCOSE 181* 140*  --  103*  --  128*  --   BUN 38* 33*  --  41*  --  27*  --   CREATININE 3.54* 3.65* 3.68* 3.99*  --  3.34*  --   CALCIUM 10.0 9.7  --  9.6  --  8.6*  --   PHOS  --   --   --   --  3.2  --  2.1*    Liver Function Tests:  Recent Labs Lab 11/28/15 1428  AST 40  ALT 18  ALKPHOS 48  BILITOT 1.1  PROT 4.9*  ALBUMIN 2.1*     Recent Labs Lab 11/28/15 1428  LIPASE 15    Recent Labs Lab 11/28/15 1436  AMMONIA 19    CBC:  Recent Labs Lab 11/25/15 0605 11/28/15 1428 11/28/15 1938 11/29/15 0449 12/01/15 0459  WBC 9.3 5.0 5.2 4.6 5.4  NEUTROABS 7.9* 3.5  --   --   --   HGB 8.0* 7.1* 7.2* 7.8* 8.2*  HCT 24.7* 22.6* 22.4* 24.2* 24.9*  MCV 79.9* 79.6* 79.8* 79.7* 80.5  PLT 133* 135* 115* 120* 134*    Cardiac Enzymes:  Recent Labs Lab 11/28/15 1428  TROPONINI 0.09*    BNP: Invalid input(s): POCBNP  CBG:  Recent Labs Lab 11/28/15 2212 11/29/15 2048  GLUCAP 25* 113*    Microbiology: Results for orders placed or performed during the hospital encounter of 11/28/15  Blood Culture (routine x 2)     Status: Abnormal   Collection Time: 11/28/15  2:28 PM  Result Value Ref Range Status   Specimen Description BLOOD LEFT ARM  Final  Special Requests BOTTLES DRAWN AEROBIC AND ANAEROBIC 5CC  Final   Culture  Setup Time   Final    Organism ID to follow GRAM POSITIVE COCCI AEROBIC BOTTLE ONLY CRITICAL RESULT CALLED TO, READ BACK BY AND VERIFIED WITH: JASON ROBBINS @ [id number] 11/29/2015 JLJ    Culture (A)  Final    STAPHYLOCOCCUS SPECIES (COAGULASE NEGATIVE) THE SIGNIFICANCE OF ISOLATING THIS ORGANISM FROM A SINGLE SET OF BLOOD CULTURES WHEN MULTIPLE SETS ARE DRAWN IS UNCERTAIN. PLEASE NOTIFY THE MICROBIOLOGY DEPARTMENT WITHIN ONE WEEK IF SPECIATION AND SENSITIVITIES ARE REQUIRED. Performed at [facility]    Report Status 11/30/2015 FINAL  Final  Blood Culture ID Panel (Reflexed)     Status: Abnormal   Collection Time: 11/28/15  2:28 PM  Result Value Ref Range Status   Enterococcus species NOT DETECTED NOT DETECTED Final   Listeria monocytogenes NOT DETECTED NOT DETECTED Final   Staphylococcus species DETECTED (A) NOT DETECTED Final    Comment: CRITICAL RESULT CALLED TO, READ BACK BY AND VERIFIED WITH: JASON ROBBINS @ 1429 11/29/2015 JLJ    Staphylococcus aureus NOT DETECTED  NOT DETECTED Final   Methicillin resistance DETECTED (A) NOT DETECTED Final    Comment: CRITICAL RESULT CALLED TO, READ BACK BY AND VERIFIED WITH: JASON ROBBINS @ [id number] 11/29/2015 JLJ    Streptococcus species NOT DETECTED NOT DETECTED Final   Streptococcus agalactiae NOT DETECTED NOT DETECTED Final   Streptococcus pneumoniae NOT DETECTED NOT DETECTED Final   Streptococcus pyogenes NOT DETECTED NOT DETECTED Final   Acinetobacter baumannii NOT DETECTED NOT DETECTED Final   Enterobacteriaceae species NOT DETECTED NOT DETECTED Final   Enterobacter cloacae complex NOT DETECTED NOT DETECTED Final   Escherichia coli NOT DETECTED NOT DETECTED Final   Klebsiella oxytoca NOT DETECTED NOT DETECTED Final   Klebsiella pneumoniae NOT DETECTED NOT DETECTED Final   Proteus species NOT DETECTED NOT DETECTED Final   Serratia marcescens NOT DETECTED NOT DETECTED Final   Haemophilus influenzae NOT DETECTED NOT DETECTED Final   Neisseria meningitidis NOT DETECTED NOT DETECTED Final   Pseudomonas aeruginosa NOT DETECTED NOT DETECTED Final   Candida albicans NOT DETECTED NOT DETECTED Final   Candida glabrata NOT DETECTED NOT DETECTED Final   Candida krusei NOT DETECTED NOT DETECTED Final   Candida parapsilosis NOT DETECTED NOT DETECTED Final   Candida tropicalis NOT DETECTED NOT DETECTED Final  Blood Culture (routine x 2)     Status: None (Preliminary result)   Collection Time: 11/28/15  2:36 PM  Result Value Ref Range Status   Specimen Description BLOOD LEFT HAND  Final   Special Requests BOTTLES DRAWN AEROBIC AND ANAEROBIC 5CC  Final   Culture NO GROWTH 3 DAYS  Final   Report Status PENDING  Incomplete  MRSA PCR Screening     Status: None   Collection Time: 11/29/15  5:10 AM  Result Value Ref Range Status   MRSA by PCR NEGATIVE NEGATIVE Final    Comment:        The GeneXpert MRSA Assay (FDA approved for NASAL specimens only), is one component of a comprehensive MRSA colonization surveillance  program. It is not intended to diagnose MRSA infection nor to guide or monitor treatment for MRSA infections.   Culture, blood (Routine X 2) w Reflex to ID Panel     Status: None (Preliminary result)   Collection Time: 11/29/15  7:41 PM  Result Value Ref Range Status   Specimen Description BLOOD LEFT [facility]  Final   Special Requests BOTTLES DRAWN  AEROBIC AND ANAEROBIC ANA7CC AER12CC  Final   Culture NO GROWTH 2 DAYS  Final   Report Status PENDING  Incomplete  Culture, blood (Routine X 2) w Reflex to ID Panel     Status: None (Preliminary result)   Collection Time: 11/29/15  7:41 PM  Result Value Ref Range Status   Specimen Description BLOOD LT HAND  Final   Special Requests BOTTLES DRAWN AEROBIC AND ANAEROBIC 4CC  Final   Culture NO GROWTH 2 DAYS  Final   Report Status PENDING  Incomplete    Coagulation Studies:  Recent Labs  11/28/15 1428  LABPROT 12.9  INR 0.97    Urinalysis: No results for input(s): COLORURINE, LABSPEC, PHURINE, GLUCOSEU, HGBUR, BILIRUBINUR, KETONESUR, PROTEINUR, UROBILINOGEN, NITRITE, LEUKOCYTESUR in the last 72 hours.  Invalid input(s): APPERANCEUR    Imaging: No results found.   Medications:    . aspirin  81 mg Oral Daily  . atorvastatin  40 mg Oral QHS  . brimonidine  1 drop Left Eye TID  . cinacalcet  120 mg Oral Q supper  . clopidogrel  75 mg Oral Daily  . feeding supplement (NEPRO CARB STEADY)  237 mL Oral TID  . folic acid  2 mg Oral Daily  . heparin  5,000 Units Subcutaneous Q8H  . pantoprazole  40 mg Oral BID  . prednisoLONE acetate  1 drop Both Eyes BID  . ranolazine  500 mg Oral BID  . senna  2 tablet Oral Daily  . sevelamer carbonate  2,400 mg Oral TID WC  . sodium chloride flush  3 mL Intravenous Q12H   sodium chloride, sodium chloride, acetaminophen **OR** acetaminophen, albuterol, alteplase, heparin, lidocaine (PF), lidocaine-prilocaine, nitroGLYCERIN, ondansetron **OR** ondansetron (ZOFRAN) IV, oxyCODONE,  pentafluoroprop-tetrafluoroeth, polyvinyl alcohol  Assessment/ Plan:  69 y.o. male with a PMHx of ESRD on HD at the [facility], anemia chronic kidney disease, secondary hyperparathyroidism, aortic aneurysm, bilateral below the knee amputation, recent peptic ulcer repair, who was admitted to [facility] on 11/28/2015 for evaluation of hypoxia.    1.  ESRD on HD TTS at the [facility]:  The patient is currently residing at a nursing home here in [place] however it appears that he's being transported to dialysis 3 times a week at the [facility].   -  Patient seen and evaluated during hemodialysis today. He appears to be tolerating well. Complete hemodialysis today.  2.  Anemia chronic kidney disease.   start Epogen 10,000 units IV with dialysis.  3.  Secondary hyperparathyroidism.   PTH 156 and phosphorus currently 2.1 -  Continue current doses of Sensipar and Renvela.  4.  Positive blood cultures.  Could potentially represent contaminant as only 1 bottle showing growth in the aerobic bottle. Coagulase-negative staph was noted only on one culture. 4 total cultures have been drawn. 3 out of the 4 are negative.    LOS: 3 Doniesha Landau 11/30/201712:47 PM

## 2015-12-01 NOTE — Progress Notes (Signed)
HD completed without issue. Unable to meet uf goal d/t low bp. Total UF=579cc. Patient currently has no complaints.

## 2015-12-01 NOTE — Clinical Social Work Note (Signed)
Patient is from [facility] and still has a bed available at [facility].  Jones Broom. Norval Morton, MSW [phone]  Mon-Fri 8a-4:30p 12/01/2015 4:41 PM

## 2015-12-01 NOTE — Progress Notes (Signed)
Dialysis initiated without issue via R AVF.

## 2015-12-01 NOTE — Progress Notes (Signed)
Pre dialysis  

## 2015-12-01 NOTE — Progress Notes (Signed)
Pre dialysis assessment 

## 2015-12-01 NOTE — Progress Notes (Signed)
Post HD assessment  

## 2015-12-02 LAB — CBC
HEMATOCRIT: 25.3 % — AB (ref 40.0–52.0)
HEMOGLOBIN: 8 g/dL — AB (ref 13.0–18.0)
MCH: 25.2 pg — ABNORMAL LOW (ref 26.0–34.0)
MCHC: 31.8 g/dL — ABNORMAL LOW (ref 32.0–36.0)
MCV: 79.4 fL — AB (ref 80.0–100.0)
Platelets: 132 10*3/uL — ABNORMAL LOW (ref 150–440)
RBC: 3.18 MIL/uL — AB (ref 4.40–5.90)
RDW: 20.7 % — ABNORMAL HIGH (ref 11.5–14.5)
WBC: 6 10*3/uL (ref 3.8–10.6)

## 2015-12-02 LAB — BASIC METABOLIC PANEL
ANION GAP: 10 (ref 5–15)
BUN: 13 mg/dL (ref 6–20)
CHLORIDE: 98 mmol/L — AB (ref 101–111)
CO2: 29 mmol/L (ref 22–32)
Calcium: 8.3 mg/dL — ABNORMAL LOW (ref 8.9–10.3)
Creatinine, Ser: 2.23 mg/dL — ABNORMAL HIGH (ref 0.61–1.24)
GFR calc Af Amer: 32 mL/min — ABNORMAL LOW (ref >60)
GFR, EST NON AFRICAN AMERICAN: 28 mL/min — AB (ref >60)
GLUCOSE: 149 mg/dL — AB (ref 65–99)
POTASSIUM: 3.9 mmol/L (ref 3.5–5.1)
Sodium: 137 mmol/L (ref 135–145)

## 2015-12-02 MED ORDER — ISOSORBIDE MONONITRATE ER 120 MG PO TB24: 120.0000 mg | ORAL_TABLET | Freq: Every day | ORAL | 0 refills | Status: AC

## 2015-12-02 NOTE — Care Management (Signed)
Patient is for discharge back to [facility] today.  Notified tonya at the [facility] and Alda Lea at [facility].  DC summary will be faxed when available

## 2015-12-02 NOTE — Progress Notes (Signed)
[facility]  ROUNDING NOTE   Subjective:  Patient seen at bedside. Had hemodialysis yesterday. Resting comfortably.   Objective:  Vital signs in last 24 hours:  Temp:  [97.1 F (36.2 C)-97.7 F (36.5 C)] 97.7 F (36.5 C) (12/01 0813) Pulse Rate:  [50-99] 81 (12/01 0813) Resp:  [13-20] 16 (12/01 0813) BP: (79-139)/(53-68) 121/53 (12/01 0813) SpO2:  [72 %-93 %] 72 % (12/01 0423) Weight:  [87.8 kg (193 lb 9 oz)-88.2 kg (194 lb 7.1 oz)] 88 kg (193 lb 14.4 oz) (11/30 1419)  Weight change:  Filed Weights   12/01/15 0953 12/01/15 1337 12/01/15 1419  Weight: 88.2 kg (194 lb 7.1 oz) 87.8 kg (193 lb 9 oz) 88 kg (193 lb 14.4 oz)    Intake/Output: I/O last 3 completed shifts: In: 590 [P.O.:590] Out: 579 [Other:579]   Intake/Output this shift:  No intake/output data recorded.  Physical Exam: General: No acute distress  Head: Normocephalic, atraumatic. Moist oral mucosal membranes  Eyes: Anicteric  Neck: Supple, trachea midline  Lungs:  Clear to auscultation, normal effort  Heart: S1S2 no rubs  Abdomen:  Soft, nontender, Bowel sounds present   Extremities: Bilateral BKA's  Neurologic: Awake, alert, following commands  Skin: No lesions  Access: RUE AVF    Basic Metabolic Panel:  Recent Labs Lab 11/28/15 1428 11/28/15 1938 11/29/15 0449 11/29/15 1335 12/01/15 0459 12/01/15 1000 12/02/15 0348  NA 132*  --  135  --  135  --  137  K 4.3  --  3.8  --  3.2*  --  3.9  CL 94*  --  96*  --  96*  --  98*  CO2 28  --  29  --  32  --  29  GLUCOSE 140*  --  103*  --  128*  --  149*  BUN 33*  --  41*  --  27*  --  13  CREATININE 3.65* 3.68* 3.99*  --  3.34*  --  2.23*  CALCIUM 9.7  --  9.6  --  8.6*  --  8.3*  PHOS  --   --   --  3.2  --  2.1*  --     Liver Function Tests:  Recent Labs Lab 11/28/15 1428  AST 40  ALT 18  ALKPHOS 48  BILITOT 1.1  PROT 4.9*  ALBUMIN 2.1*    Recent Labs Lab 11/28/15 1428  LIPASE 15    Recent Labs Lab  11/28/15 1436  AMMONIA 19    CBC:  Recent Labs Lab 11/28/15 1428 11/28/15 1938 11/29/15 0449 12/01/15 0459 12/02/15 0348  WBC 5.0 5.2 4.6 5.4 6.0  NEUTROABS 3.5  --   --   --   --   HGB 7.1* 7.2* 7.8* 8.2* 8.0*  HCT 22.6* 22.4* 24.2* 24.9* 25.3*  MCV 79.6* 79.8* 79.7* 80.5 79.4*  PLT 135* 115* 120* 134* 132*    Cardiac Enzymes:  Recent Labs Lab 11/28/15 1428  TROPONINI 0.09*    BNP: Invalid input(s): POCBNP  CBG:  Recent Labs Lab 11/28/15 2212 11/29/15 2048  GLUCAP 87* 113*    Microbiology: Results for orders placed or performed during the hospital encounter of 11/28/15  Blood Culture (routine x 2)     Status: Abnormal   Collection Time: 11/28/15  2:28 PM  Result Value Ref Range Status   Specimen Description BLOOD LEFT ARM  Final   Special Requests BOTTLES DRAWN AEROBIC AND ANAEROBIC 5CC  Final   Culture  Setup  Time   Final    Organism ID to follow GRAM POSITIVE COCCI AEROBIC BOTTLE ONLY CRITICAL RESULT CALLED TO, READ BACK BY AND VERIFIED WITH: JASON ROBBINS @ [id number] 11/29/2015 JLJ    Culture (A)  Final    STAPHYLOCOCCUS SPECIES (COAGULASE NEGATIVE) THE SIGNIFICANCE OF ISOLATING THIS ORGANISM FROM A SINGLE SET OF BLOOD CULTURES WHEN MULTIPLE SETS ARE DRAWN IS UNCERTAIN. PLEASE NOTIFY THE MICROBIOLOGY DEPARTMENT WITHIN ONE WEEK IF SPECIATION AND SENSITIVITIES ARE REQUIRED. Performed at [facility]    Report Status 11/30/2015 FINAL  Final  Blood Culture ID Panel (Reflexed)     Status: Abnormal   Collection Time: 11/28/15  2:28 PM  Result Value Ref Range Status   Enterococcus species NOT DETECTED NOT DETECTED Final   Listeria monocytogenes NOT DETECTED NOT DETECTED Final   Staphylococcus species DETECTED (A) NOT DETECTED Final    Comment: CRITICAL RESULT CALLED TO, READ BACK BY AND VERIFIED WITH: JASON ROBBINS @ 1429 11/29/2015 JLJ    Staphylococcus aureus NOT DETECTED NOT DETECTED Final   Methicillin resistance DETECTED (A) NOT DETECTED  Final    Comment: CRITICAL RESULT CALLED TO, READ BACK BY AND VERIFIED WITH: JASON ROBBINS @ [id number] 11/29/2015 JLJ    Streptococcus species NOT DETECTED NOT DETECTED Final   Streptococcus agalactiae NOT DETECTED NOT DETECTED Final   Streptococcus pneumoniae NOT DETECTED NOT DETECTED Final   Streptococcus pyogenes NOT DETECTED NOT DETECTED Final   Acinetobacter baumannii NOT DETECTED NOT DETECTED Final   Enterobacteriaceae species NOT DETECTED NOT DETECTED Final   Enterobacter cloacae complex NOT DETECTED NOT DETECTED Final   Escherichia coli NOT DETECTED NOT DETECTED Final   Klebsiella oxytoca NOT DETECTED NOT DETECTED Final   Klebsiella pneumoniae NOT DETECTED NOT DETECTED Final   Proteus species NOT DETECTED NOT DETECTED Final   Serratia marcescens NOT DETECTED NOT DETECTED Final   Haemophilus influenzae NOT DETECTED NOT DETECTED Final   Neisseria meningitidis NOT DETECTED NOT DETECTED Final   Pseudomonas aeruginosa NOT DETECTED NOT DETECTED Final   Candida albicans NOT DETECTED NOT DETECTED Final   Candida glabrata NOT DETECTED NOT DETECTED Final   Candida krusei NOT DETECTED NOT DETECTED Final   Candida parapsilosis NOT DETECTED NOT DETECTED Final   Candida tropicalis NOT DETECTED NOT DETECTED Final  Blood Culture (routine x 2)     Status: None (Preliminary result)   Collection Time: 11/28/15  2:36 PM  Result Value Ref Range Status   Specimen Description BLOOD LEFT HAND  Final   Special Requests BOTTLES DRAWN AEROBIC AND ANAEROBIC 5CC  Final   Culture NO GROWTH 4 DAYS  Final   Report Status PENDING  Incomplete  MRSA PCR Screening     Status: None   Collection Time: 11/29/15  5:10 AM  Result Value Ref Range Status   MRSA by PCR NEGATIVE NEGATIVE Final    Comment:        The GeneXpert MRSA Assay (FDA approved for NASAL specimens only), is one component of a comprehensive MRSA colonization surveillance program. It is not intended to diagnose MRSA infection nor to guide  or monitor treatment for MRSA infections.   Culture, blood (Routine X 2) w Reflex to ID Panel     Status: None (Preliminary result)   Collection Time: 11/29/15  7:41 PM  Result Value Ref Range Status   Specimen Description BLOOD LEFT AC  Final   Special Requests BOTTLES DRAWN AEROBIC AND ANAEROBIC ANA7CC AER12CC  Final   Culture NO GROWTH 3 DAYS  Final   Report Status PENDING  Incomplete  Culture, blood (Routine X 2) w Reflex to ID Panel     Status: None (Preliminary result)   Collection Time: 11/29/15  7:41 PM  Result Value Ref Range Status   Specimen Description BLOOD LT HAND  Final   Special Requests BOTTLES DRAWN AEROBIC AND ANAEROBIC 4CC  Final   Culture NO GROWTH 3 DAYS  Final   Report Status PENDING  Incomplete    Coagulation Studies: No results for input(s): LABPROT, INR in the last 72 hours.  Urinalysis: No results for input(s): COLORURINE, LABSPEC, PHURINE, GLUCOSEU, HGBUR, BILIRUBINUR, KETONESUR, PROTEINUR, UROBILINOGEN, NITRITE, LEUKOCYTESUR in the last 72 hours.  Invalid input(s): APPERANCEUR    Imaging: No results found.   Medications:    . aspirin  81 mg Oral Daily  . atorvastatin  40 mg Oral QHS  . brimonidine  1 drop Left Eye TID  . cinacalcet  120 mg Oral Q supper  . clopidogrel  75 mg Oral Daily  . [START ON 12/03/2015] epoetin (EPOGEN/PROCRIT) injection  10,000 Units Intravenous Q T,Th,Sa-HD  . feeding supplement (NEPRO CARB STEADY)  237 mL Oral TID  . folic acid  2 mg Oral Daily  . heparin  5,000 Units Subcutaneous Q8H  . pantoprazole  40 mg Oral BID  . prednisoLONE acetate  1 drop Both Eyes BID  . ranolazine  500 mg Oral BID  . senna  2 tablet Oral Daily  . sevelamer carbonate  2,400 mg Oral TID WC  . sodium chloride flush  3 mL Intravenous Q12H   sodium chloride, sodium chloride, acetaminophen **OR** acetaminophen, albuterol, alteplase, heparin, lidocaine (PF), lidocaine-prilocaine, nitroGLYCERIN, ondansetron **OR** ondansetron (ZOFRAN) IV,  oxyCODONE, pentafluoroprop-tetrafluoroeth, polyvinyl alcohol  Assessment/ Plan:  70 y.o. male with a PMHx of ESRD on HD at the [facility], anemia chronic kidney disease, secondary hyperparathyroidism, aortic aneurysm, bilateral below the knee amputation, recent peptic ulcer repair, who was admitted to [facility] on 11/28/2015 for evaluation of hypoxia.    1.  ESRD on HD TTS at the [facility]:  The patient is currently residing at a nursing home here in [place] however it appears that he's being transported to dialysis 3 times a week at the [facility].   -  Patient completed hemodialysis yesterday. No acute indication for dialysis today. Probable discharge today. He will resume hemodialysis at the [facility] upon discharge.  2.  Anemia chronic kidney disease.   hemoglobin currently 8.0. Would recommend Epogen as an outpatient when he goes back to the [facility].  3.  Secondary hyperparathyroidism.   PTH 156 and last phosphorus 2.1.   -  Continue current doses of Sensipar and Renvela.  4.  Positive blood cultures.  Could potentially represent contaminant as only 1 bottle showing growth in the aerobic bottle. Coagulase-negative staph was noted only on one culture. 4 total cultures have been drawn. 3 out of the 4 are negative.    LOS: 4 Jakyia Gaccione 12/1/20178:59 AM

## 2015-12-02 NOTE — Progress Notes (Signed)
Report called to [facility]/ verbalized an understanding/ iv and tele removed/ EMS called to transport.

## 2015-12-02 NOTE — Discharge Instructions (Signed)
Hypotension Hypotension, commonly called low blood pressure, is when the force of blood pumping through your arteries is too weak. Arteries are blood vessels that carry blood from the heart throughout the body. When blood pressure is too low, you may not get enough blood to your brain or to the rest of your organs. This can cause weakness, light-headedness, rapid heartbeat, and fainting. Depending on the cause and severity, hypotension may be harmless (benign) or cause serious problems (critical). What are the causes? Possible causes of hypertension include:  Blood loss.  Loss of body fluids (dehydration).  Heart problems.  Hormone (endocrine) problems.  Pregnancy.  Severe infection.  Lack of certain nutrients.  Severe allergic reactions (anaphylaxis).  Certain medicines, such as blood pressure medicine or medicines that make the body lose excess fluids (diuretics). Sometimes, hypotension can be caused by not taking medicine as directed, such as taking too much of a certain medicine. What increases the risk? Certain factors can make you more likely to develop hypotension, including:  Age. Risk increases as you get older.  Conditions that affect the heart or the central nervous system.  Taking certain medicines, such as blood pressure medicine or diuretics.  Being pregnant. What are the signs or symptoms? Symptoms of this condition may include:  Weakness.  Light-headedness.  Dizziness.  Blurred vision.  Fatigue.  Rapid heartbeat.  Fainting, in severe cases. How is this diagnosed? This condition is diagnosed based on:  Your medical history.  Your symptoms.  Your blood pressure measurement. Your health care provider will check your blood pressure when you are:  Lying down.  Sitting.  Standing. A blood pressure reading is recorded as two numbers, such as "120 over 80" (or 120/80). The first ("top") number is called the systolic pressure. It is a measure of  the pressure in your arteries as your heart beats. The second ("bottom") number is called the diastolic pressure. It is a measure of the pressure in your arteries when your heart relaxes between beats. Blood pressure is measured in a unit called mm Hg. Healthy blood pressure for adults is 120/80. If your blood pressure is below 90/60, you may be diagnosed with hypotension. Other information or tests that may be used to diagnose hypotension include:  Your other vital signs, such as your heart rate and temperature.  Blood tests.  Tilt table test. For this test, you will be safely secured to a table that moves you from a lying position to an upright position. Your heart rhythm and blood pressure will be monitored during the test. How is this treated? Treatment for this condition may include:  Changing your diet. This may involve eating more salt (sodium) or drinking more water.  Taking medicines to raise your blood pressure.  Changing the dosage of certain medicines you are taking that might be lowering your blood pressure.  Wearing compression stockings. These stockings help to prevent blood clots and reduce swelling in your legs. In some cases, you may need to go to the hospital for:  Fluid replacement. This means you will receive fluids through an IV tube.  Blood replacement. This means you will receive donated blood through an IV tube (transfusion).  Treating an infection or heart problems, if this applies.  Monitoring. You may need to be monitored while medicines that you are taking wear off. Follow these instructions at home: Eating and drinking  Drink enough fluid to keep your urine clear or pale yellow.  Eat a healthy diet and follow instructions from   your health care provider about eating or drinking restrictions. A healthy diet includes:  Fresh fruits and vegetables.  Whole grains.  Lean meats.  Low-fat dairy products.  Eat extra salt only as directed. Do not add  extra salt to your diet unless your health care provider told you to do that.  Eat frequent, small meals.  Avoid standing up suddenly after eating. Medicines  Take over-the-counter and prescription medicines only as told by your health care provider.  Follow instructions from your health care provider about changing the dosage of your current medicines, if this applies.  Do not stop or adjust any of your medicines on your own. General instructions  Wear compression stockings as told by your health care provider.  Get up slowly from lying down or sitting positions. This gives your blood pressure a chance to adjust.  Avoid hot showers and excessive heat as directed by your health care provider.  Return to your normal activities as told by your health care provider. Ask your health care provider what activities are safe for you.  Do not use any products that contain nicotine or tobacco, such as cigarettes and e-cigarettes. If you need help quitting, ask your health care provider.  Keep all follow-up visits as told by your health care provider. This is important. Contact a health care provider if:  You vomit.  You have diarrhea.  You have a fever for more than 2-3 days.  You feel more thirsty than usual.  You feel weak and tired. Get help right away if:  You have chest pain.  You have a fast or irregular heartbeat.  You develop numbness in any part of your body.  You cannot move your arms or your legs.  You have trouble speaking.  You become sweaty or feel light-headed.  You faint.  You feel short of breath.  You have trouble staying awake.  You feel confused. This information is not intended to replace advice given to you by your health care provider. Make sure you discuss any questions you have with your health care provider. Document Released: 12/18/2004 Document Revised: 07/08/2015 Document Reviewed: 06/09/2015 Elsevier Interactive Patient Education  2017  Elsevier Inc.  

## 2015-12-02 NOTE — Clinical Social Work Note (Signed)
Patient to be d/c'ed today to [facility]. Patient and family agreeable to plans will transport via ems RN to call report to [phone].  Caly Pellum, MSW Mon-Fri 8a-4:30p [phone]  

## 2015-12-02 NOTE — Care Management Important Message (Signed)
Important Message  Patient Details  Name: Gregory Livingston MRN: [id number] Date of Birth: 1945-12-18   Medicare Important Message Given:  Yes    Katrina Stack, RN 12/02/2015, 1:47 PM

## 2015-12-02 NOTE — Discharge Summary (Signed)
[facility] NAME: Gregory Livingston    MR#:  [id number]  DATE OF BIRTH:  1945-07-22  DATE OF ADMISSION:  11/28/2015   ADMITTING PHYSICIAN: Lytle Butte, MD  DATE OF DISCHARGE: 12/02/2015  PRIMARY CARE PHYSICIAN: [facility]   ADMISSION DIAGNOSIS:  Right sided weakness [R53.1] Hypotension, unspecified hypotension type [I95.9] Altered mental status, unspecified altered mental status type [R41.82] Anemia, unspecified type [D64.9] DISCHARGE DIAGNOSIS:  Active Problems:   Hypotension   Pressure injury of skin  SECONDARY DIAGNOSIS:   Past Medical History:  Diagnosis Date  . AAA (abdominal aortic aneurysm) ([place])   . CHF (congestive heart failure) ([place])   . Diabetes mellitus without complication ([place])   . Dialysis patient ([place])   . Hyperparathyroidism ([place])   . Hypertension   . Multiple myeloma ([place])   . Peptic ulcer with perforation ([place])   . PVD (peripheral vascular disease) [facility])    HOSPITAL COURSE:  70 y.o.malewith a PMHx of ESRD on HD at the [facility], anemia chronic kidney disease, secondary hyperparathyroidism, aortic aneurysm, bilateral below the knee amputation, recent peptic ulcer repair, who was admitted to [facility] on 11/27/2017for evaluation of hypoxia. The patient is on continuous oxygen at the nursing home at [facility]. Patient is a poor historian unable to offer significant history  1. Hypotension: resolved - blood c/s likely initial one false +  2. Acute encephalopathy: Resolved patient at baseline per family  3. Microcytic anemia: STABLE.  4. Chronic systolic heart failure unspecified ejection fraction:well compensated at this time. Echo showed EF 35-40%  5. End-stage renal disease on dialysis   6. Hypokalemia: repleted  DISCHARGE CONDITIONS:  stable CONSULTS OBTAINED:  Treatment Team:  Lytle Butte, MD Anthonette Legato, MD Leonel Ramsay, MD DRUG ALLERGIES:    Allergies  Allergen Reactions  . Ivp Dye [Iodinated Diagnostic Agents]    DISCHARGE MEDICATIONS:     Medication List    TAKE these medications   albuterol 108 (90 Base) MCG/ACT inhaler Commonly known as:  PROVENTIL HFA;VENTOLIN HFA Inhale 2 puffs into the lungs every 6 (six) hours as needed for shortness of breath.   aspirin 81 MG chewable tablet Chew 81 mg by mouth daily.   atorvastatin 40 MG tablet Commonly known as:  LIPITOR Take 40 mg by mouth at bedtime.   bacitracin 500 UNIT/GM ointment Apply 1 application topically daily. Apply small amount topically to left knee abrasion daily   brimonidine 0.2 % ophthalmic solution Commonly known as:  ALPHAGAN Place 1 drop into the left eye 3 (three) times daily.   CALCIUM 600 1500 (600 Ca) MG Tabs tablet Generic drug:  calcium carbonate Take 600 mg of elemental calcium by mouth every 6 (six) hours as needed.   carvedilol 25 MG tablet Commonly known as:  COREG Take 25 mg by mouth every 12 (twelve) hours.   cinacalcet 60 MG tablet Commonly known as:  SENSIPAR Take 120 mg by mouth daily. Take 120 mg every evening with food.   clopidogrel 75 MG tablet Commonly known as:  PLAVIX Take 75 mg by mouth daily.   DUREZOL 0.05 % Emul Generic drug:  Difluprednate Place 1 drop into both eyes daily.   feeding supplement (NEPRO CARB STEADY) Liqd Take 237 mLs by mouth 3 (three) times daily.   folic acid 1 MG tablet Commonly known as:  FOLVITE Take 2 mg by mouth daily.   gabapentin 100 MG capsule Commonly  known as:  NEURONTIN Take 200 mg by mouth at bedtime.   hydroxypropyl methylcellulose / hypromellose 2.5 % ophthalmic solution Commonly known as:  ISOPTO TEARS / GONIOVISC Place 1 drop into both eyes 4 (four) times daily as needed for dry eyes.   isosorbide mononitrate 120 MG 24 hr tablet Commonly known as:  IMDUR Take 1 tablet (120 mg total) by mouth daily. What changed:  how much to take   nitroGLYCERIN 0.4 MG SL  tablet Commonly known as:  NITROSTAT Place 0.4 mg under the tongue every 5 (five) minutes as needed for chest pain.   oxyCODONE 5 MG immediate release tablet Commonly known as:  Oxy IR/ROXICODONE Take 5 mg by mouth every 4 (four) hours as needed for severe pain.   pantoprazole 40 MG tablet Commonly known as:  PROTONIX Take 40 mg by mouth 2 (two) times daily.   ranolazine 500 MG 12 hr tablet Commonly known as:  RANEXA Take 500 mg by mouth 2 (two) times daily.   RENAL VITAMIN PO Take 1 tablet by mouth daily.   senna 8.6 MG Tabs tablet Commonly known as:  SENOKOT Take 2 tablets by mouth daily.   sevelamer carbonate 800 MG tablet Commonly known as:  RENVELA Take 2,400 mg by mouth 3 (three) times daily with meals.        DISCHARGE INSTRUCTIONS:  Recommend repeat blood c/s*2 on HD session next week (1 week from now) with results to PCP and Dr Ola Spurr DIET:  Regular diet DISCHARGE CONDITION:  Good ACTIVITY:  Activity as tolerated OXYGEN:  Home Oxygen: Yes.    Oxygen Delivery: 2-3 liters O2 via [place] As before DISCHARGE LOCATION:  nursing home   If you experience worsening of your admission symptoms, develop shortness of breath, life threatening emergency, suicidal or homicidal thoughts you must seek medical attention immediately by calling 911 or calling your MD immediately  if symptoms less severe.  You Must read complete instructions/literature along with all the possible adverse reactions/side effects for all the Medicines you take and that have been prescribed to you. Take any new Medicines after you have completely understood and accpet all the possible adverse reactions/side effects.   Please note  You were cared for by a hospitalist during your hospital stay. If you have any questions about your discharge medications or the care you received while you were in the hospital after you are discharged, you can call the unit and asked to speak with the hospitalist on  call if the hospitalist that took care of you is not available. Once you are discharged, your primary care physician will handle any further medical issues. Please note that NO REFILLS for any discharge medications will be authorized once you are discharged, as it is imperative that you return to your primary care physician (or establish a relationship with a primary care physician if you do not have one) for your aftercare needs so that they can reassess your need for medications and monitor your lab values.    On the day of Discharge:  VITAL SIGNS:  Blood pressure (!) 105/54, pulse 95, temperature 97.5 F (36.4 C), temperature source Oral, resp. rate 16, height _0  (1.372 m), weight 86.3 kg (190 lb 3.2 oz), SpO2 99 %. PHYSICAL EXAMINATION:  GENERAL:  70 y.o.-year-old patient lying in the bed with no acute distress.  EYES: Pupils equal, round, reactive to light and accommodation. No scleral icterus. Extraocular muscles intact.  HEENT: Head atraumatic, normocephalic. Oropharynx and nasopharynx clear.  NECK:  Supple, no jugular venous distention. No thyroid enlargement, no tenderness.  LUNGS: Normal breath sounds bilaterally, no wheezing, rales,rhonchi or crepitation. No use of accessory muscles of respiration.  CARDIOVASCULAR: S1, S2 normal. No murmurs, rubs, or gallops.  ABDOMEN: Soft, non-tender, non-distended. Bowel sounds present. No organomegaly or mass.  EXTREMITIES: No pedal edema, cyanosis, or clubbing.  NEUROLOGIC: Cranial nerves II through XII are intact. Muscle strength 5/5 in all extremities. Sensation intact. Gait not checked.  PSYCHIATRIC: The patient is alert and oriented x 3.  SKIN: No obvious rash, lesion, or ulcer.  DATA REVIEW:   CBC  Recent Labs Lab 12/02/15 0348  WBC 6.0  HGB 8.0*  HCT 25.3*  PLT 132*    Chemistries   Recent Labs Lab 11/28/15 1428  12/02/15 0348  NA 132*  < > 137  K 4.3  < > 3.9  CL 94*  < > 98*  CO2 28  < > 29  GLUCOSE 140*  < > 149*   BUN 33*  < > 13  CREATININE 3.65*  < > 2.23*  CALCIUM 9.7  < > 8.3*  AST 40  --   --   ALT 18  --   --   ALKPHOS 48  --   --   BILITOT 1.1  --   --   < > = values in this interval not displayed.    Management plans discussed with the patient, family and they are in agreement.  CODE STATUS: FULL CODE   TOTAL TIME TAKING CARE OF THIS PATIENT: 45 minutes.    Max Sane M.D on 12/02/2015 at 3:34 PM  Between 7am to 6pm - Pager - [phone]  After 6pm go to [email or web address] - Proofreader  [facility]  Office  [phone]  CC: Primary care physician; [facility]   Note: This dictation was prepared with Dragon dictation along with smaller phrase technology. Any transcriptional errors that result from this process are unintentional.

## 2015-12-03 LAB — CULTURE, BLOOD (ROUTINE X 2): Culture: NO GROWTH

## 2015-12-04 ENCOUNTER — Emergency Department: Payer: Medicare Other

## 2015-12-04 ENCOUNTER — Encounter: Payer: Self-pay | Admitting: Emergency Medicine

## 2015-12-04 ENCOUNTER — Emergency Department
Admission: EM | Admit: 2015-12-04 | Discharge: 2015-12-04 | Disposition: A | Discharge status: Home / Self Care | Payer: Medicare Other | Attending: Emergency Medicine | Admitting: Emergency Medicine

## 2015-12-04 DIAGNOSIS — N186 End stage renal disease: Secondary | ICD-10-CM | POA: Diagnosis not present

## 2015-12-04 DIAGNOSIS — E1122 Type 2 diabetes mellitus with diabetic chronic kidney disease: Secondary | ICD-10-CM | POA: Insufficient documentation

## 2015-12-04 DIAGNOSIS — Z992 Dependence on renal dialysis: Secondary | ICD-10-CM | POA: Diagnosis not present

## 2015-12-04 DIAGNOSIS — Z951 Presence of aortocoronary bypass graft: Secondary | ICD-10-CM | POA: Diagnosis not present

## 2015-12-04 DIAGNOSIS — R410 Disorientation, unspecified: Secondary | ICD-10-CM

## 2015-12-04 DIAGNOSIS — R4182 Altered mental status, unspecified: Secondary | ICD-10-CM | POA: Diagnosis present

## 2015-12-04 DIAGNOSIS — I509 Heart failure, unspecified: Secondary | ICD-10-CM | POA: Insufficient documentation

## 2015-12-04 DIAGNOSIS — I132 Hypertensive heart and chronic kidney disease with heart failure and with stage 5 chronic kidney disease, or end stage renal disease: Secondary | ICD-10-CM | POA: Diagnosis not present

## 2015-12-04 DIAGNOSIS — Z7982 Long term (current) use of aspirin: Secondary | ICD-10-CM | POA: Insufficient documentation

## 2015-12-04 DIAGNOSIS — Z87891 Personal history of nicotine dependence: Secondary | ICD-10-CM | POA: Insufficient documentation

## 2015-12-04 DIAGNOSIS — Z79899 Other long term (current) drug therapy: Secondary | ICD-10-CM | POA: Diagnosis not present

## 2015-12-04 LAB — CBC
HEMATOCRIT: 25.1 % — AB (ref 40.0–52.0)
Hemoglobin: 8 g/dL — ABNORMAL LOW (ref 13.0–18.0)
MCH: 24.8 pg — ABNORMAL LOW (ref 26.0–34.0)
MCHC: 31.8 g/dL — ABNORMAL LOW (ref 32.0–36.0)
MCV: 78.1 fL — AB (ref 80.0–100.0)
PLATELETS: 153 10*3/uL (ref 150–440)
RBC: 3.21 MIL/uL — ABNORMAL LOW (ref 4.40–5.90)
RDW: 19.6 % — AB (ref 11.5–14.5)
WBC: 10.3 10*3/uL (ref 3.8–10.6)

## 2015-12-04 LAB — COMPREHENSIVE METABOLIC PANEL
ALT: 20 U/L (ref 17–63)
AST: 24 U/L (ref 15–41)
Albumin: 2 g/dL — ABNORMAL LOW (ref 3.5–5.0)
Alkaline Phosphatase: 57 U/L (ref 38–126)
Anion gap: 8 (ref 5–15)
BILIRUBIN TOTAL: 0.6 mg/dL (ref 0.3–1.2)
BUN: 30 mg/dL — ABNORMAL HIGH (ref 6–20)
CHLORIDE: 96 mmol/L — AB (ref 101–111)
CO2: 29 mmol/L (ref 22–32)
CREATININE: 3.66 mg/dL — AB (ref 0.61–1.24)
Calcium: 8.6 mg/dL — ABNORMAL LOW (ref 8.9–10.3)
GFR, EST AFRICAN AMERICAN: 18 mL/min — AB (ref >60)
GFR, EST NON AFRICAN AMERICAN: 15 mL/min — AB (ref >60)
Glucose, Bld: 112 mg/dL — ABNORMAL HIGH (ref 65–99)
POTASSIUM: 4.8 mmol/L (ref 3.5–5.1)
Sodium: 133 mmol/L — ABNORMAL LOW (ref 135–145)
TOTAL PROTEIN: 5 g/dL — AB (ref 6.5–8.1)

## 2015-12-04 LAB — CULTURE, BLOOD (ROUTINE X 2)
Culture: NO GROWTH
Culture: NO GROWTH

## 2015-12-04 NOTE — ED Provider Notes (Signed)
[facility] Emergency Department Provider Note   ____________________________________________    I have reviewed the triage vital signs and the nursing notes.   HISTORY  Chief Complaint Altered mental status    HPI Gregory Livingston is a 70 y.o. male who was sent from nursing home for reported altered mental status. Nursing home also feels that the patient may need dialysis. However the patient appears to be at his baseline. He is answering questions appropriately. No shortness of breath, no fevers or chills. No palpitations. The patient has no complaints, he is not sure why he is here   Past Medical History:  Diagnosis Date  . AAA (abdominal aortic aneurysm) ([place])   . CHF (congestive heart failure) ([place])   . Diabetes mellitus without complication ([place])   . Dialysis patient ([place])   . Hyperparathyroidism ([place])   . Hypertension   . Multiple myeloma ([place])   . Peptic ulcer with perforation ([place])   . PVD (peripheral vascular disease) [facility])     Patient Active Problem List   Diagnosis Date Noted  . Pressure injury of skin 11/29/2015  . Hypotension 11/28/2015    Past Surgical History:  Procedure Laterality Date  . AV FISTULA PLACEMENT    . BELOW KNEE LEG AMPUTATION Bilateral   . bilateral below knee amputation    . PACEMAKER PLACEMENT      Prior to Admission medications   Medication Sig Start Date End Date Taking? Authorizing Provider  acetaminophen (TYLENOL) 325 MG tablet Take 975 mg by mouth every 8 (eight) hours as needed.   Yes Historical Provider, MD  albuterol (PROVENTIL HFA;VENTOLIN HFA) 108 (90 Base) MCG/ACT inhaler Inhale 2 puffs into the lungs every 6 (six) hours as needed for shortness of breath.   Yes Historical Provider, MD  aspirin 81 MG chewable tablet Chew 81 mg by mouth daily.   Yes Historical Provider, MD  atorvastatin (LIPITOR) 40 MG tablet Take 40 mg by mouth at bedtime.   Yes Historical Provider, MD  B Complex-C-Folic Acid  (RENAL VITAMIN PO) Take 1 tablet by mouth daily.   Yes Historical Provider, MD  bacitracin 500 UNIT/GM ointment Apply 1 application topically daily. Apply small amount topically to left knee abrasion daily   Yes Historical Provider, MD  brimonidine (ALPHAGAN) 0.2 % ophthalmic solution Place 1 drop into the left eye 3 (three) times daily.   Yes Historical Provider, MD  calcium-vitamin D (OSCAL WITH D) 500-200 MG-UNIT tablet Take 1 tablet by mouth 2 (two) times daily.   Yes Historical Provider, MD  carvedilol (COREG) 25 MG tablet Take 25 mg by mouth every 12 (twelve) hours.   Yes Historical Provider, MD  cinacalcet (SENSIPAR) 60 MG tablet Take 120 mg by mouth daily. Take 120 mg every evening with food.   Yes Historical Provider, MD  clopidogrel (PLAVIX) 75 MG tablet Take 75 mg by mouth daily.   Yes Historical Provider, MD  Difluprednate (DUREZOL) 0.05 % EMUL Place 1 drop into both eyes daily.   Yes Historical Provider, MD  folic acid (FOLVITE) 1 MG tablet Take 2 mg by mouth daily.   Yes Historical Provider, MD  gabapentin (NEURONTIN) 100 MG capsule Take 200 mg by mouth at bedtime.   Yes Historical Provider, MD  Heparin Sodium, Porcine, PF 5000 UNIT/0.5ML SOLN Inject 0.5 mLs as directed every 8 (eight) hours.   Yes Historical Provider, MD  hydroxypropyl methylcellulose / hypromellose (ISOPTO TEARS / GONIOVISC) 2.5 % ophthalmic solution Place 1 drop into  both eyes 4 (four) times daily as needed for dry eyes.   Yes Historical Provider, MD  isosorbide mononitrate (IMDUR) 120 MG 24 hr tablet Take 1 tablet (120 mg total) by mouth daily. Patient taking differently: Take 240 mg by mouth daily.  12/02/15  Yes Vipul Manuella Ghazi, MD  nitroGLYCERIN (NITROSTAT) 0.4 MG SL tablet Place 0.4 mg under the tongue every 5 (five) minutes as needed for chest pain.   Yes Historical Provider, MD  Nutritional Supplements (FEEDING SUPPLEMENT, NEPRO CARB STEADY,) LIQD Take 237 mLs by mouth 3 (three) times daily.   Yes Historical  Provider, MD  oxyCODONE (OXY IR/ROXICODONE) 5 MG immediate release tablet Take 5 mg by mouth every 4 (four) hours as needed for severe pain.   Yes Historical Provider, MD  pantoprazole (PROTONIX) 40 MG tablet Take 40 mg by mouth 2 (two) times daily.   Yes Historical Provider, MD  ranolazine (RANEXA) 500 MG 12 hr tablet Take 500 mg by mouth 2 (two) times daily.   Yes Historical Provider, MD  senna (SENOKOT) 8.6 MG TABS tablet Take 2 tablets by mouth daily.   Yes Historical Provider, MD  sevelamer carbonate (RENVELA) 800 MG tablet Take 2,400 mg by mouth 3 (three) times daily with meals.   Yes Historical Provider, MD     Allergies Ivp dye [iodinated diagnostic agents]  Family History  Problem Relation Age of Onset  . Hypertension Other     Social History Social History  Substance Use Topics  . Smoking status: Former Research scientist (life sciences)  . Smokeless tobacco: Never Used  . Alcohol use Yes    Review of SystemsLimited by dementia  Constitutional: No fever/chills  Cardiovascular: Denies chest pain. Respiratory: Denies shortness of breath. Gastrointestinal: No abdominal pain.   Neurological: Negative for headaches or weakness  10-point ROS otherwise negative.  ____________________________________________   PHYSICAL EXAM:  VITAL SIGNS: ED Triage Vitals  Enc Vitals Group     BP 12/04/15 0930 129/65     Pulse Rate 12/04/15 0930 74     Resp 12/04/15 0930 18     Temp 12/04/15 0930 97.4 F (36.3 C)     Temp Source 12/04/15 0930 Oral     SpO2 12/04/15 0924 95 %     Weight 12/04/15 0930 195 lb (88.5 kg)     Height 12/04/15 0930 4' 6" (1.372 m)     Head Circumference --      Peak Flow --      Pain Score 12/04/15 0932 8     Pain Loc --      Pain Edu? --      Excl. in [place]? --     Constitutional: Alert.No acute distress. Calm Eyes: Conjunctivae are normal.  Head: Atraumatic. Nose: No congestion/rhinnorhea. Mouth/Throat: Mucous membranes are moist.    Cardiovascular: Normal rate,  regular rhythm. Grossly normal heart sounds.  Good peripheral circulation. Respiratory: Normal respiratory effort.  No retractions. Lungs CTAB. Gastrointestinal: Soft and nontender. No distention.  No CVA tenderness. Genitourinary: deferred Musculoskeletal: Bilateral AKA Neurologic:  Normal speech and language. No gross focal neurologic deficits are appreciated.  Skin:  Skin is warm, dry and intact. No rash noted. Psychiatric: Mood and affect are normal. Speech and behavior are normal.  ____________________________________________   LABS (all labs ordered are listed, but only abnormal results are displayed)  Labs Reviewed  CBC - Abnormal; Notable for the following:       Result Value   RBC 3.21 (*)    Hemoglobin 8.0 (*)  HCT 25.1 (*)    MCV 78.1 (*)    MCH 24.8 (*)    MCHC 31.8 (*)    RDW 19.6 (*)    All other components within normal limits  COMPREHENSIVE METABOLIC PANEL - Abnormal; Notable for the following:    Sodium 133 (*)    Chloride 96 (*)    Glucose, Bld 112 (*)    BUN 30 (*)    Creatinine, Ser 3.66 (*)    Calcium 8.6 (*)    Total Protein 5.0 (*)    Albumin 2.0 (*)    GFR calc non Af Amer 15 (*)    GFR calc Af Amer 18 (*)    All other components within normal limits   ____________________________________________  EKG  ED ECG REPORT I, Lavonia Drafts, the attending physician, personally viewed and interpreted this ECG.  Date: 12/04/2015 Rate: 72 Rhythm: normal sinus rhythm QRS Axis: normal Intervals: normal ST/T Wave abnormalities: normal Conduction Disturbances: Left bundle branch block Narrative Interpretation: unremarkable  ____________________________________________  RADIOLOGY  Chest x-ray unremarkable ____________________________________________   PROCEDURES  Procedure(s) performed: No    Critical Care performed: No ____________________________________________   INITIAL IMPRESSION / ASSESSMENT AND PLAN / ED COURSE  Pertinent  labs & imaging results that were available during my care of the patient were reviewed by me and considered in my medical decision making (see chart for details).  Patient well-appearing and in no acute distress. Nurse here saw the patient last week and she reports he is at his baseline. His lab work is unremarkable, no need for emergent dialysis. Chest x-ray is benign. Well appearing and normal exam. We'll discharge and recommend outpatient dialysis tomorrow  Clinical Course    ____________________________________________   FINAL CLINICAL IMPRESSION(S) / ED DIAGNOSES  Final diagnoses:  Confusion      NEW MEDICATIONS STARTED DURING THIS VISIT:  New Prescriptions   No medications on file     Note:  This document was prepared using Dragon voice recognition software and may include unintentional dictation errors.    Lavonia Drafts, MD 12/04/15 1240

## 2015-12-04 NOTE — Discharge Instructions (Signed)
Patient appears well in the ED. His labs are unremarkable. Please arrange for him to receive dialysis tomorrow.

## 2015-12-04 NOTE — ED Notes (Signed)
Spoke with Therapist, sports at [facility]. Pt was last dialyzed on Thurs 11/30. MD Kinner notified.

## 2015-12-04 NOTE — ED Notes (Signed)
Pt discharge with EMS to transfer to [facility]. Pt's discharge instructions sent with EMS for facility to review. RN called facility to inform that the Pt's labs work ok per MD and that the Pt would be returning.

## 2015-12-04 NOTE — ED Notes (Signed)
Pt is alert and oriented. Pt answering questions without difficulty.

## 2015-12-04 NOTE — ED Triage Notes (Signed)
Pt arrived via EMS from [facility]. Staff reported to EMS pt was not responsive, however, when EMS arrived pt was responsive and talking to staff.  EMS states staff told them the patient needs emergency HD and has not been to dialysis in 3 days but are unsure why. Pt has upper arm fistula on right which is bandaged up and may not have been able to access his fistula. Pt states his normal dialysis days are MWF.

## 2015-12-09 ENCOUNTER — Inpatient Hospital Stay
Admission: EM | Admit: 2015-12-09 | Discharge: 2016-01-02 | DRG: 870 | Disposition: E | Payer: Non-veteran care | Attending: Internal Medicine | Admitting: Internal Medicine

## 2015-12-09 DIAGNOSIS — F0390 Unspecified dementia without behavioral disturbance: Secondary | ICD-10-CM | POA: Diagnosis present

## 2015-12-09 DIAGNOSIS — E1151 Type 2 diabetes mellitus with diabetic peripheral angiopathy without gangrene: Secondary | ICD-10-CM | POA: Diagnosis present

## 2015-12-09 DIAGNOSIS — Z992 Dependence on renal dialysis: Secondary | ICD-10-CM

## 2015-12-09 DIAGNOSIS — D62 Acute posthemorrhagic anemia: Secondary | ICD-10-CM | POA: Diagnosis not present

## 2015-12-09 DIAGNOSIS — Z79899 Other long term (current) drug therapy: Secondary | ICD-10-CM

## 2015-12-09 DIAGNOSIS — I462 Cardiac arrest due to underlying cardiac condition: Secondary | ICD-10-CM | POA: Diagnosis not present

## 2015-12-09 DIAGNOSIS — N186 End stage renal disease: Secondary | ICD-10-CM

## 2015-12-09 DIAGNOSIS — E785 Hyperlipidemia, unspecified: Secondary | ICD-10-CM | POA: Diagnosis present

## 2015-12-09 DIAGNOSIS — K5190 Ulcerative colitis, unspecified, without complications: Secondary | ICD-10-CM | POA: Diagnosis present

## 2015-12-09 DIAGNOSIS — Z91041 Radiographic dye allergy status: Secondary | ICD-10-CM

## 2015-12-09 DIAGNOSIS — E213 Hyperparathyroidism, unspecified: Secondary | ICD-10-CM | POA: Diagnosis present

## 2015-12-09 DIAGNOSIS — R571 Hypovolemic shock: Secondary | ICD-10-CM | POA: Diagnosis not present

## 2015-12-09 DIAGNOSIS — Z89511 Acquired absence of right leg below knee: Secondary | ICD-10-CM

## 2015-12-09 DIAGNOSIS — Z6841 Body Mass Index (BMI) 40.0 and over, adult: Secondary | ICD-10-CM

## 2015-12-09 DIAGNOSIS — D65 Disseminated intravascular coagulation [defibrination syndrome]: Secondary | ICD-10-CM | POA: Diagnosis not present

## 2015-12-09 DIAGNOSIS — Z7982 Long term (current) use of aspirin: Secondary | ICD-10-CM

## 2015-12-09 DIAGNOSIS — I509 Heart failure, unspecified: Secondary | ICD-10-CM | POA: Diagnosis present

## 2015-12-09 DIAGNOSIS — J9690 Respiratory failure, unspecified, unspecified whether with hypoxia or hypercapnia: Secondary | ICD-10-CM

## 2015-12-09 DIAGNOSIS — I959 Hypotension, unspecified: Secondary | ICD-10-CM | POA: Diagnosis not present

## 2015-12-09 DIAGNOSIS — Z7902 Long term (current) use of antithrombotics/antiplatelets: Secondary | ICD-10-CM

## 2015-12-09 DIAGNOSIS — Z4659 Encounter for fitting and adjustment of other gastrointestinal appliance and device: Secondary | ICD-10-CM

## 2015-12-09 DIAGNOSIS — A419 Sepsis, unspecified organism: Principal | ICD-10-CM | POA: Diagnosis present

## 2015-12-09 DIAGNOSIS — E11649 Type 2 diabetes mellitus with hypoglycemia without coma: Secondary | ICD-10-CM | POA: Diagnosis present

## 2015-12-09 DIAGNOSIS — Z7401 Bed confinement status: Secondary | ICD-10-CM

## 2015-12-09 DIAGNOSIS — Z87891 Personal history of nicotine dependence: Secondary | ICD-10-CM

## 2015-12-09 DIAGNOSIS — T82868A Thrombosis of vascular prosthetic devices, implants and grafts, initial encounter: Secondary | ICD-10-CM | POA: Diagnosis not present

## 2015-12-09 DIAGNOSIS — L89154 Pressure ulcer of sacral region, stage 4: Secondary | ICD-10-CM | POA: Diagnosis present

## 2015-12-09 DIAGNOSIS — I472 Ventricular tachycardia: Secondary | ICD-10-CM | POA: Diagnosis present

## 2015-12-09 DIAGNOSIS — Z950 Presence of cardiac pacemaker: Secondary | ICD-10-CM

## 2015-12-09 DIAGNOSIS — I132 Hypertensive heart and chronic kidney disease with heart failure and with stage 5 chronic kidney disease, or end stage renal disease: Secondary | ICD-10-CM | POA: Diagnosis present

## 2015-12-09 DIAGNOSIS — J9600 Acute respiratory failure, unspecified whether with hypoxia or hypercapnia: Secondary | ICD-10-CM

## 2015-12-09 DIAGNOSIS — K5900 Constipation, unspecified: Secondary | ICD-10-CM

## 2015-12-09 DIAGNOSIS — R609 Edema, unspecified: Secondary | ICD-10-CM

## 2015-12-09 DIAGNOSIS — K922 Gastrointestinal hemorrhage, unspecified: Secondary | ICD-10-CM

## 2015-12-09 DIAGNOSIS — G9340 Encephalopathy, unspecified: Secondary | ICD-10-CM

## 2015-12-09 DIAGNOSIS — I714 Abdominal aortic aneurysm, without rupture: Secondary | ICD-10-CM | POA: Diagnosis present

## 2015-12-09 DIAGNOSIS — Z978 Presence of other specified devices: Secondary | ICD-10-CM

## 2015-12-09 DIAGNOSIS — K210 Gastro-esophageal reflux disease with esophagitis: Secondary | ICD-10-CM | POA: Diagnosis present

## 2015-12-09 DIAGNOSIS — D638 Anemia in other chronic diseases classified elsewhere: Secondary | ICD-10-CM | POA: Diagnosis present

## 2015-12-09 DIAGNOSIS — L8995 Pressure ulcer of unspecified site, unstageable: Secondary | ICD-10-CM

## 2015-12-09 DIAGNOSIS — E1122 Type 2 diabetes mellitus with diabetic chronic kidney disease: Secondary | ICD-10-CM | POA: Diagnosis present

## 2015-12-09 DIAGNOSIS — Z89512 Acquired absence of left leg below knee: Secondary | ICD-10-CM

## 2015-12-09 DIAGNOSIS — R6520 Severe sepsis without septic shock: Secondary | ICD-10-CM | POA: Diagnosis present

## 2015-12-09 DIAGNOSIS — E871 Hypo-osmolality and hyponatremia: Secondary | ICD-10-CM | POA: Diagnosis present

## 2015-12-09 DIAGNOSIS — C9000 Multiple myeloma not having achieved remission: Secondary | ICD-10-CM | POA: Diagnosis present

## 2015-12-09 DIAGNOSIS — J9601 Acute respiratory failure with hypoxia: Secondary | ICD-10-CM | POA: Diagnosis present

## 2015-12-09 DIAGNOSIS — K2900 Acute gastritis without bleeding: Secondary | ICD-10-CM | POA: Diagnosis present

## 2015-12-09 DIAGNOSIS — E860 Dehydration: Secondary | ICD-10-CM | POA: Diagnosis present

## 2015-12-09 DIAGNOSIS — R402432 Glasgow coma scale score 3-8, at arrival to emergency department: Secondary | ICD-10-CM | POA: Diagnosis present

## 2015-12-09 DIAGNOSIS — R6521 Severe sepsis with septic shock: Secondary | ICD-10-CM | POA: Diagnosis present

## 2015-12-09 DIAGNOSIS — N2581 Secondary hyperparathyroidism of renal origin: Secondary | ICD-10-CM | POA: Diagnosis present

## 2015-12-09 DIAGNOSIS — I469 Cardiac arrest, cause unspecified: Secondary | ICD-10-CM | POA: Diagnosis not present

## 2015-12-09 DIAGNOSIS — K259 Gastric ulcer, unspecified as acute or chronic, without hemorrhage or perforation: Secondary | ICD-10-CM | POA: Diagnosis present

## 2015-12-09 DIAGNOSIS — E872 Acidosis: Secondary | ICD-10-CM | POA: Diagnosis not present

## 2015-12-09 DIAGNOSIS — K2210 Ulcer of esophagus without bleeding: Secondary | ICD-10-CM | POA: Diagnosis present

## 2015-12-09 DIAGNOSIS — L89159 Pressure ulcer of sacral region, unspecified stage: Secondary | ICD-10-CM

## 2015-12-09 DIAGNOSIS — G9341 Metabolic encephalopathy: Secondary | ICD-10-CM | POA: Diagnosis not present

## 2015-12-09 DIAGNOSIS — L089 Local infection of the skin and subcutaneous tissue, unspecified: Secondary | ICD-10-CM

## 2015-12-09 LAB — CBC WITH DIFFERENTIAL/PLATELET
BASOS ABS: 0 10*3/uL (ref 0–0.1)
Basophils Relative: 0 %
Eosinophils Absolute: 0 10*3/uL (ref 0–0.7)
Eosinophils Relative: 0 %
HEMATOCRIT: 27 % — AB (ref 40.0–52.0)
HEMOGLOBIN: 8.7 g/dL — AB (ref 13.0–18.0)
LYMPHS PCT: 5 %
Lymphs Abs: 1.1 10*3/uL (ref 1.0–3.6)
MCH: 25.7 pg — ABNORMAL LOW (ref 26.0–34.0)
MCHC: 32.3 g/dL (ref 32.0–36.0)
MCV: 79.7 fL — AB (ref 80.0–100.0)
Monocytes Absolute: 1.1 10*3/uL — ABNORMAL HIGH (ref 0.2–1.0)
Monocytes Relative: 5 %
NEUTROS ABS: 19.4 10*3/uL — AB (ref 1.4–6.5)
Neutrophils Relative %: 90 %
Platelets: 126 10*3/uL — ABNORMAL LOW (ref 150–440)
RBC: 3.38 MIL/uL — AB (ref 4.40–5.90)
RDW: 19.2 % — ABNORMAL HIGH (ref 11.5–14.5)
WBC: 21.7 10*3/uL — ABNORMAL HIGH (ref 3.8–10.6)

## 2015-12-09 LAB — BASIC METABOLIC PANEL
ANION GAP: 7 (ref 5–15)
BUN: 28 mg/dL — ABNORMAL HIGH (ref 6–20)
CO2: 26 mmol/L (ref 22–32)
Calcium: 9.1 mg/dL (ref 8.9–10.3)
Chloride: 98 mmol/L — ABNORMAL LOW (ref 101–111)
Creatinine, Ser: 3.08 mg/dL — ABNORMAL HIGH (ref 0.61–1.24)
GFR calc Af Amer: 22 mL/min — ABNORMAL LOW (ref >60)
GFR, EST NON AFRICAN AMERICAN: 19 mL/min — AB (ref >60)
GLUCOSE: 166 mg/dL — AB (ref 65–99)
POTASSIUM: 3.5 mmol/L (ref 3.5–5.1)
Sodium: 131 mmol/L — ABNORMAL LOW (ref 135–145)

## 2015-12-09 MED ORDER — SODIUM CHLORIDE 0.9 % IV BOLUS (SEPSIS)
500.0000 mL | Freq: Once | INTRAVENOUS | Status: AC
  Administered 2015-12-10: 500 mL via INTRAVENOUS

## 2015-12-09 NOTE — ED Triage Notes (Signed)
Pt brought in via ems for c/o hypotension per nursing home - no other complaints - pt denies cough/congestion - pt denies any symptoms except back pain which is chronic and sacral pain r/t wound

## 2015-12-09 NOTE — ED Notes (Signed)
Pt brought in via ems for c/o hypotension per nursing home - no other complaints - pt denies cough/congestion - pt denies any symptoms except back pain which is chronic and sacral pain r/t wound

## 2015-12-09 NOTE — ED Provider Notes (Signed)
[facility] Emergency Department Provider Note  ____________________________________________   First MD Initiated Contact with Patient 12/07/2015 2221     (approximate)  I have reviewed the triage vital signs and the nursing notes.   HISTORY  Chief Complaint Hypotension   HPI Gregory Livingston is a 70 y.o. male with a history of end-stage renal disease on dialysis as well as CHF was presented to the emergency department with hypotension. He was sent in for concern for sepsis and the possible source was his sacral decubitus ulcer. At his nursing home he had a blood pressure check with his pressure in the 80s. It then resolved to the low 90s. Usually the patient is in the lower 100s up to the 130s per his nursing supervisor spoke with on the phone. The patient denies any complaints at this time. Denies any pain. No report of fever. Patient says that he does not make any urine. Was dialyzed yesterday.     Past Medical History:  Diagnosis Date  . AAA (abdominal aortic aneurysm) ([place])   . CHF (congestive heart failure) ([place])   . Diabetes mellitus without complication ([place])   . Dialysis patient ([place])   . Hyperparathyroidism ([place])   . Hypertension   . Multiple myeloma ([place])   . Peptic ulcer with perforation ([place])   . PVD (peripheral vascular disease) [facility])     Patient Active Problem List   Diagnosis Date Noted  . Pressure injury of skin 11/29/2015  . Hypotension 11/28/2015    Past Surgical History:  Procedure Laterality Date  . AV FISTULA PLACEMENT    . BELOW KNEE LEG AMPUTATION Bilateral   . bilateral below knee amputation    . PACEMAKER PLACEMENT      Prior to Admission medications   Medication Sig Start Date End Date Taking? Authorizing Provider  acetaminophen (TYLENOL) 325 MG tablet Take 975 mg by mouth every 8 (eight) hours as needed.    Historical Provider, MD  albuterol (PROVENTIL HFA;VENTOLIN HFA) 108 (90 Base) MCG/ACT inhaler Inhale 2 puffs into  the lungs every 6 (six) hours as needed for shortness of breath.    Historical Provider, MD  aspirin 81 MG chewable tablet Chew 81 mg by mouth daily.    Historical Provider, MD  atorvastatin (LIPITOR) 40 MG tablet Take 40 mg by mouth at bedtime.    Historical Provider, MD  B Complex-C-Folic Acid (RENAL VITAMIN PO) Take 1 tablet by mouth daily.    Historical Provider, MD  bacitracin 500 UNIT/GM ointment Apply 1 application topically daily. Apply small amount topically to left knee abrasion daily    Historical Provider, MD  brimonidine (ALPHAGAN) 0.2 % ophthalmic solution Place 1 drop into the left eye 3 (three) times daily.    Historical Provider, MD  calcium-vitamin D (OSCAL WITH D) 500-200 MG-UNIT tablet Take 1 tablet by mouth 2 (two) times daily.    Historical Provider, MD  carvedilol (COREG) 25 MG tablet Take 25 mg by mouth every 12 (twelve) hours.    Historical Provider, MD  cinacalcet (SENSIPAR) 60 MG tablet Take 120 mg by mouth daily. Take 120 mg every evening with food.    Historical Provider, MD  clopidogrel (PLAVIX) 75 MG tablet Take 75 mg by mouth daily.    Historical Provider, MD  Difluprednate (DUREZOL) 0.05 % EMUL Place 1 drop into both eyes daily.    Historical Provider, MD  folic acid (FOLVITE) 1 MG tablet Take 2 mg by mouth daily.    Historical  Provider, MD  gabapentin (NEURONTIN) 100 MG capsule Take 200 mg by mouth at bedtime.    Historical Provider, MD  Heparin Sodium, Porcine, PF 5000 UNIT/0.5ML SOLN Inject 0.5 mLs as directed every 8 (eight) hours.    Historical Provider, MD  hydroxypropyl methylcellulose / hypromellose (ISOPTO TEARS / GONIOVISC) 2.5 % ophthalmic solution Place 1 drop into both eyes 4 (four) times daily as needed for dry eyes.    Historical Provider, MD  isosorbide mononitrate (IMDUR) 120 MG 24 hr tablet Take 1 tablet (120 mg total) by mouth daily. Patient taking differently: Take 240 mg by mouth daily.  12/02/15   Max Sane, MD  nitroGLYCERIN (NITROSTAT) 0.4 MG  SL tablet Place 0.4 mg under the tongue every 5 (five) minutes as needed for chest pain.    Historical Provider, MD  Nutritional Supplements (FEEDING SUPPLEMENT, NEPRO CARB STEADY,) LIQD Take 237 mLs by mouth 3 (three) times daily.    Historical Provider, MD  oxyCODONE (OXY IR/ROXICODONE) 5 MG immediate release tablet Take 5 mg by mouth every 4 (four) hours as needed for severe pain.    Historical Provider, MD  pantoprazole (PROTONIX) 40 MG tablet Take 40 mg by mouth 2 (two) times daily.    Historical Provider, MD  ranolazine (RANEXA) 500 MG 12 hr tablet Take 500 mg by mouth 2 (two) times daily.    Historical Provider, MD  senna (SENOKOT) 8.6 MG TABS tablet Take 2 tablets by mouth daily.    Historical Provider, MD  sevelamer carbonate (RENVELA) 800 MG tablet Take 2,400 mg by mouth 3 (three) times daily with meals.    Historical Provider, MD    Allergies Ivp dye [iodinated diagnostic agents]  Family History  Problem Relation Age of Onset  . Hypertension Other     Social History Social History  Substance Use Topics  . Smoking status: Former Research scientist (life sciences)  . Smokeless tobacco: Never Used  . Alcohol use Yes    Review of Systems Constitutional: No fever/chills Eyes: No visual changes. ENT: No sore throat. Cardiovascular: Denies chest pain. Respiratory: Denies shortness of breath. Gastrointestinal: No abdominal pain.  No nausea, no vomiting.  No diarrhea.  No constipation. Genitourinary: Negative for dysuria. Musculoskeletal: Negative for back pain. Skin: Negative for rash. Neurological: Negative for headaches, focal weakness or numbness.  10-point ROS otherwise negative.  ____________________________________________   PHYSICAL EXAM:  VITAL SIGNS: ED Triage Vitals  Enc Vitals Group     BP 12/12/2015 2222 (!) 97/50     Pulse Rate 12/29/2015 2222 64     Resp 01/01/2016 2222 16     Temp 12/06/2015 2222 98.4 F (36.9 C)     Temp Source 12/02/2015 2222 Oral     SpO2 12/21/2015 2220 99 %      Weight 12/02/2015 2223 184 lb (83.5 kg)     Height 12/21/2015 2223 5' 1"  (1.549 m)     Head Circumference --      Peak Flow --      Pain Score 12/03/2015 2223 4     Pain Loc --      Pain Edu? --      Excl. in [place]? --     Constitutional: Alert and oriented. Well appearing and in no acute distress. Eyes: Conjunctivae are normal. PERRL. EOMI. Head: Atraumatic. Nose: No congestion/rhinnorhea. Mouth/Throat: Mucous membranes are moist.  Neck: No stridor.   Cardiovascular: Normal rate, regular rhythm. Grossly normal heart sounds. Palpable thrill to the right upper extremity dialysis access. Respiratory: Normal respiratory  effort.  No retractions. Lungs CTAB. Gastrointestinal: Soft and nontender. No distention.  Musculoskeletal: Bilateral BKA's. Neurologic:  Normal speech and language. No gross focal neurologic deficits are appreciated. No gait instability. Skin:  Foul-smelling sacral decubitus ulcer.  Dimensions are about 10 x 12 cm. Gray-eschar overlying the ulcer. Minimal tenderness palpation. No purulent material. No surrounding induration. No exposed bone. Psychiatric: Mood and affect are normal. Speech and behavior are normal.  ____________________________________________   LABS (all labs ordered are listed, but only abnormal results are displayed)  Labs Reviewed  CULTURE, BLOOD (ROUTINE X 2)  CULTURE, BLOOD (ROUTINE X 2)  CBC WITH DIFFERENTIAL/PLATELET  BASIC METABOLIC PANEL   ____________________________________________  EKG   ____________________________________________  RADIOLOGY   ____________________________________________   PROCEDURES  Procedure(s) performed:   Procedures  Critical Care performed:   ____________________________________________   INITIAL IMPRESSION / ASSESSMENT AND PLAN / ED COURSE  Pertinent labs & imaging results that were available during my care of the patient were reviewed by me and considered in my medical decision making (see chart  for details).    Clinical Course   ----------------------------------------- 11:35 PM on 12/20/2015 -----------------------------------------  Pending labs at this time. At the least the patient will require wound care for a sacral decubitus ulcer. However, we will check labs to assess his white blood cell count and also give the patient a small fluid bolus to see if his pressure improves. Signed out to Dr. Joni Fears.   ____________________________________________   FINAL CLINICAL IMPRESSION(S) / ED DIAGNOSES  Sacral decubitus ulcer. Hypotension.    NEW MEDICATIONS STARTED DURING THIS VISIT:  New Prescriptions   No medications on file     Note:  This document was prepared using Dragon voice recognition software and may include unintentional dictation errors.    Orbie Pyo, MD 12/18/2015 [phone]

## 2015-12-10 ENCOUNTER — Encounter: Payer: Self-pay | Admitting: Internal Medicine

## 2015-12-10 DIAGNOSIS — L89159 Pressure ulcer of sacral region, unspecified stage: Secondary | ICD-10-CM | POA: Diagnosis not present

## 2015-12-10 DIAGNOSIS — E1151 Type 2 diabetes mellitus with diabetic peripheral angiopathy without gangrene: Secondary | ICD-10-CM | POA: Diagnosis present

## 2015-12-10 DIAGNOSIS — I132 Hypertensive heart and chronic kidney disease with heart failure and with stage 5 chronic kidney disease, or end stage renal disease: Secondary | ICD-10-CM | POA: Diagnosis present

## 2015-12-10 DIAGNOSIS — Z6841 Body Mass Index (BMI) 40.0 and over, adult: Secondary | ICD-10-CM | POA: Diagnosis not present

## 2015-12-10 DIAGNOSIS — L89154 Pressure ulcer of sacral region, stage 4: Secondary | ICD-10-CM | POA: Diagnosis present

## 2015-12-10 DIAGNOSIS — K5190 Ulcerative colitis, unspecified, without complications: Secondary | ICD-10-CM | POA: Diagnosis present

## 2015-12-10 DIAGNOSIS — T82868A Thrombosis of vascular prosthetic devices, implants and grafts, initial encounter: Secondary | ICD-10-CM | POA: Diagnosis not present

## 2015-12-10 DIAGNOSIS — E872 Acidosis: Secondary | ICD-10-CM | POA: Diagnosis not present

## 2015-12-10 DIAGNOSIS — R571 Hypovolemic shock: Secondary | ICD-10-CM | POA: Diagnosis not present

## 2015-12-10 DIAGNOSIS — R6520 Severe sepsis without septic shock: Secondary | ICD-10-CM | POA: Diagnosis present

## 2015-12-10 DIAGNOSIS — L89150 Pressure ulcer of sacral region, unstageable: Secondary | ICD-10-CM | POA: Diagnosis not present

## 2015-12-10 DIAGNOSIS — I472 Ventricular tachycardia: Secondary | ICD-10-CM | POA: Diagnosis present

## 2015-12-10 DIAGNOSIS — A419 Sepsis, unspecified organism: Secondary | ICD-10-CM | POA: Diagnosis present

## 2015-12-10 DIAGNOSIS — G9340 Encephalopathy, unspecified: Secondary | ICD-10-CM | POA: Diagnosis not present

## 2015-12-10 DIAGNOSIS — I959 Hypotension, unspecified: Secondary | ICD-10-CM | POA: Diagnosis present

## 2015-12-10 DIAGNOSIS — N186 End stage renal disease: Secondary | ICD-10-CM | POA: Diagnosis present

## 2015-12-10 DIAGNOSIS — E871 Hypo-osmolality and hyponatremia: Secondary | ICD-10-CM | POA: Diagnosis present

## 2015-12-10 DIAGNOSIS — G9341 Metabolic encephalopathy: Secondary | ICD-10-CM | POA: Diagnosis not present

## 2015-12-10 DIAGNOSIS — D65 Disseminated intravascular coagulation [defibrination syndrome]: Secondary | ICD-10-CM | POA: Diagnosis not present

## 2015-12-10 DIAGNOSIS — E1122 Type 2 diabetes mellitus with diabetic chronic kidney disease: Secondary | ICD-10-CM | POA: Diagnosis present

## 2015-12-10 DIAGNOSIS — K922 Gastrointestinal hemorrhage, unspecified: Secondary | ICD-10-CM | POA: Diagnosis not present

## 2015-12-10 DIAGNOSIS — K2210 Ulcer of esophagus without bleeding: Secondary | ICD-10-CM | POA: Diagnosis present

## 2015-12-10 DIAGNOSIS — I469 Cardiac arrest, cause unspecified: Secondary | ICD-10-CM | POA: Diagnosis not present

## 2015-12-10 DIAGNOSIS — I462 Cardiac arrest due to underlying cardiac condition: Secondary | ICD-10-CM | POA: Diagnosis not present

## 2015-12-10 DIAGNOSIS — J9601 Acute respiratory failure with hypoxia: Secondary | ICD-10-CM | POA: Diagnosis present

## 2015-12-10 DIAGNOSIS — R6521 Severe sepsis with septic shock: Secondary | ICD-10-CM | POA: Diagnosis present

## 2015-12-10 DIAGNOSIS — C9000 Multiple myeloma not having achieved remission: Secondary | ICD-10-CM | POA: Diagnosis present

## 2015-12-10 DIAGNOSIS — N2581 Secondary hyperparathyroidism of renal origin: Secondary | ICD-10-CM | POA: Diagnosis present

## 2015-12-10 DIAGNOSIS — D62 Acute posthemorrhagic anemia: Secondary | ICD-10-CM | POA: Diagnosis not present

## 2015-12-10 LAB — CBC
HEMATOCRIT: 27.5 % — AB (ref 40.0–52.0)
Hemoglobin: 8.9 g/dL — ABNORMAL LOW (ref 13.0–18.0)
MCH: 25.6 pg — ABNORMAL LOW (ref 26.0–34.0)
MCHC: 32.4 g/dL (ref 32.0–36.0)
MCV: 79.1 fL — AB (ref 80.0–100.0)
PLATELETS: 124 10*3/uL — AB (ref 150–440)
RBC: 3.48 MIL/uL — ABNORMAL LOW (ref 4.40–5.90)
RDW: 19.4 % — AB (ref 11.5–14.5)
WBC: 23.5 10*3/uL — ABNORMAL HIGH (ref 3.8–10.6)

## 2015-12-10 LAB — BASIC METABOLIC PANEL
Anion gap: 7 (ref 5–15)
BUN: 31 mg/dL — AB (ref 6–20)
CHLORIDE: 99 mmol/L — AB (ref 101–111)
CO2: 26 mmol/L (ref 22–32)
CREATININE: 3.25 mg/dL — AB (ref 0.61–1.24)
Calcium: 9.1 mg/dL (ref 8.9–10.3)
GFR calc Af Amer: 20 mL/min — ABNORMAL LOW (ref >60)
GFR calc non Af Amer: 18 mL/min — ABNORMAL LOW (ref >60)
GLUCOSE: 152 mg/dL — AB (ref 65–99)
POTASSIUM: 3.5 mmol/L (ref 3.5–5.1)
Sodium: 132 mmol/L — ABNORMAL LOW (ref 135–145)

## 2015-12-10 LAB — LACTIC ACID, PLASMA: LACTIC ACID, VENOUS: 1.9 mmol/L (ref 0.5–1.9)

## 2015-12-10 MED ORDER — VANCOMYCIN HCL IN DEXTROSE 1-5 GM/200ML-% IV SOLN
1000.0000 mg | Freq: Once | INTRAVENOUS | Status: DC
  Filled 2015-12-10: qty 200

## 2015-12-10 MED ORDER — NITROGLYCERIN 0.4 MG SL SUBL: 0.4000 mg | SUBLINGUAL_TABLET | SUBLINGUAL | Status: DC | PRN

## 2015-12-10 MED ORDER — PANTOPRAZOLE SODIUM 40 MG PO TBEC
40.0000 mg | DELAYED_RELEASE_TABLET | Freq: Two times a day (BID) | ORAL | Status: DC
  Administered 2015-12-10 – 2015-12-11 (×3): 40 mg via ORAL
  Filled 2015-12-10 (×3): qty 1

## 2015-12-10 MED ORDER — SODIUM CHLORIDE 0.9 % IV BOLUS (SEPSIS)
1000.0000 mL | Freq: Once | INTRAVENOUS | Status: AC
  Administered 2015-12-10: 1000 mL via INTRAVENOUS

## 2015-12-10 MED ORDER — DIFLUPREDNATE 0.05 % OP EMUL
1.0000 [drp] | Freq: Every day | OPHTHALMIC | Status: DC
  Filled 2015-12-10: qty 5

## 2015-12-10 MED ORDER — OXYCODONE HCL 5 MG PO TABS
5.0000 mg | ORAL_TABLET | ORAL | Status: DC | PRN
  Administered 2015-12-10: 5 mg via ORAL
  Filled 2015-12-10: qty 1

## 2015-12-10 MED ORDER — EPOETIN ALFA 10000 UNIT/ML IJ SOLN
10000.0000 [IU] | INTRAMUSCULAR | Status: DC
  Administered 2015-12-10 – 2015-12-13 (×2): 10000 [IU] via INTRAVENOUS
  Filled 2015-12-10: qty 1

## 2015-12-10 MED ORDER — ONDANSETRON HCL 4 MG/2ML IJ SOLN: 4.0000 mg | Freq: Four times a day (QID) | INTRAMUSCULAR | Status: DC | PRN

## 2015-12-10 MED ORDER — SENNA 8.6 MG PO TABS
2.0000 | ORAL_TABLET | Freq: Every day | ORAL | Status: DC
  Administered 2015-12-10: 17.2 mg via ORAL
  Filled 2015-12-10: qty 2

## 2015-12-10 MED ORDER — ASPIRIN 81 MG PO CHEW
81.0000 mg | CHEWABLE_TABLET | Freq: Every day | ORAL | Status: DC
  Administered 2015-12-10: 81 mg via ORAL
  Filled 2015-12-10: qty 1

## 2015-12-10 MED ORDER — VANCOMYCIN HCL IN DEXTROSE 1-5 GM/200ML-% IV SOLN: 1000.0000 mg | INTRAVENOUS | Status: DC

## 2015-12-10 MED ORDER — HEPARIN SODIUM (PORCINE) 5000 UNIT/ML IJ SOLN
5000.0000 [IU] | Freq: Three times a day (TID) | INTRAMUSCULAR | Status: DC
  Administered 2015-12-10 – 2015-12-11 (×3): 5000 [IU] via SUBCUTANEOUS
  Filled 2015-12-10 (×3): qty 1

## 2015-12-10 MED ORDER — PIPERACILLIN-TAZOBACTAM 3.375 G IVPB 30 MIN: 3.3750 g | Freq: Once | INTRAVENOUS | Status: DC

## 2015-12-10 MED ORDER — PIPERACILLIN-TAZOBACTAM 3.375 G IVPB
3.3750 g | Freq: Two times a day (BID) | INTRAVENOUS | Status: DC
  Administered 2015-12-10 – 2015-12-11 (×3): 3.375 g via INTRAVENOUS
  Filled 2015-12-10 (×3): qty 50

## 2015-12-10 MED ORDER — VANCOMYCIN HCL IN DEXTROSE 1-5 GM/200ML-% IV SOLN: 1000.0000 mg | Freq: Once | INTRAVENOUS | Status: DC

## 2015-12-10 MED ORDER — MIDODRINE HCL 5 MG PO TABS
5.0000 mg | ORAL_TABLET | Freq: Three times a day (TID) | ORAL | Status: DC
  Administered 2015-12-10 – 2015-12-11 (×4): 5 mg via ORAL
  Filled 2015-12-10 (×4): qty 1

## 2015-12-10 MED ORDER — FOLIC ACID 1 MG PO TABS
2.0000 mg | ORAL_TABLET | Freq: Every day | ORAL | Status: DC
  Administered 2015-12-10 – 2015-12-11 (×2): 2 mg via ORAL
  Filled 2015-12-10 (×2): qty 2

## 2015-12-10 MED ORDER — PREDNISOLONE ACETATE 0.12 % OP SUSP
2.0000 [drp] | Freq: Two times a day (BID) | OPHTHALMIC | Status: DC
  Administered 2015-12-10 – 2015-12-15 (×11): 2 [drp] via OPHTHALMIC
  Filled 2015-12-10: qty 5
  Filled 2015-12-10: qty 1

## 2015-12-10 MED ORDER — SODIUM CHLORIDE 0.9 % IV SOLN
500.0000 mg | Freq: Once | INTRAVENOUS | Status: AC
  Administered 2015-12-10: 500 mg via INTRAVENOUS
  Filled 2015-12-10: qty 500

## 2015-12-10 MED ORDER — ACETAMINOPHEN 650 MG RE SUPP
650.0000 mg | Freq: Four times a day (QID) | RECTAL | Status: DC | PRN
  Administered 2015-12-13: 650 mg via RECTAL
  Filled 2015-12-10: qty 1

## 2015-12-10 MED ORDER — POLYVINYL ALCOHOL 1.4 % OP SOLN: 1.0000 [drp] | Freq: Four times a day (QID) | OPHTHALMIC | Status: DC | PRN

## 2015-12-10 MED ORDER — BRIMONIDINE TARTRATE 0.2 % OP SOLN
1.0000 [drp] | Freq: Three times a day (TID) | OPHTHALMIC | Status: DC
  Administered 2015-12-10 – 2015-12-15 (×16): 1 [drp] via OPHTHALMIC
  Filled 2015-12-10 (×2): qty 5

## 2015-12-10 MED ORDER — CLOPIDOGREL BISULFATE 75 MG PO TABS
75.0000 mg | ORAL_TABLET | Freq: Every day | ORAL | Status: DC
  Administered 2015-12-10: 75 mg via ORAL
  Filled 2015-12-10: qty 1

## 2015-12-10 MED ORDER — SODIUM CHLORIDE 0.9% FLUSH: 3.0000 mL | INTRAVENOUS | Status: DC | PRN

## 2015-12-10 MED ORDER — ATORVASTATIN CALCIUM 20 MG PO TABS
40.0000 mg | ORAL_TABLET | Freq: Every day | ORAL | Status: DC
  Administered 2015-12-10: 40 mg via ORAL
  Filled 2015-12-10: qty 2

## 2015-12-10 MED ORDER — VANCOMYCIN HCL IN DEXTROSE 1-5 GM/200ML-% IV SOLN
1000.0000 mg | INTRAVENOUS | Status: DC
  Administered 2015-12-10: 1000 mg via INTRAVENOUS
  Filled 2015-12-10: qty 200

## 2015-12-10 MED ORDER — SODIUM CHLORIDE 0.9% FLUSH
3.0000 mL | Freq: Two times a day (BID) | INTRAVENOUS | Status: DC
  Administered 2015-12-10 (×3): 3 mL via INTRAVENOUS

## 2015-12-10 MED ORDER — SEVELAMER CARBONATE 800 MG PO TABS
2400.0000 mg | ORAL_TABLET | Freq: Three times a day (TID) | ORAL | Status: DC
  Administered 2015-12-10 – 2015-12-11 (×5): 2400 mg via ORAL
  Filled 2015-12-10 (×5): qty 3

## 2015-12-10 MED ORDER — PIPERACILLIN-TAZOBACTAM 3.375 G IVPB 30 MIN
3.3750 g | Freq: Once | INTRAVENOUS | Status: AC
  Administered 2015-12-10: 3.375 g via INTRAVENOUS
  Filled 2015-12-10: qty 50

## 2015-12-10 MED ORDER — RANOLAZINE ER 500 MG PO TB12
500.0000 mg | ORAL_TABLET | Freq: Two times a day (BID) | ORAL | Status: DC
  Administered 2015-12-10 – 2015-12-11 (×3): 500 mg via ORAL
  Filled 2015-12-10 (×3): qty 1

## 2015-12-10 MED ORDER — ALBUTEROL SULFATE (2.5 MG/3ML) 0.083% IN NEBU: 3.0000 mL | INHALATION_SOLUTION | Freq: Four times a day (QID) | RESPIRATORY_TRACT | Status: DC | PRN

## 2015-12-10 MED ORDER — CINACALCET HCL 30 MG PO TABS
120.0000 mg | ORAL_TABLET | Freq: Every day | ORAL | Status: DC
  Administered 2015-12-10 – 2015-12-11 (×2): 120 mg via ORAL
  Filled 2015-12-10 (×2): qty 4

## 2015-12-10 MED ORDER — VANCOMYCIN HCL IN DEXTROSE 1-5 GM/200ML-% IV SOLN
1000.0000 mg | Freq: Once | INTRAVENOUS | Status: AC
  Administered 2015-12-10: 1000 mg via INTRAVENOUS
  Filled 2015-12-10: qty 200

## 2015-12-10 MED ORDER — BACITRACIN 500 UNIT/GM EX OINT
1.0000 "application " | TOPICAL_OINTMENT | Freq: Every day | CUTANEOUS | Status: DC
  Administered 2015-12-10 – 2015-12-12 (×3): 1 via TOPICAL
  Filled 2015-12-10 (×4): qty 0.9

## 2015-12-10 MED ORDER — SODIUM CHLORIDE 0.9 % IV SOLN
250.0000 mL | INTRAVENOUS | Status: DC | PRN
  Administered 2015-12-11: 18:00:00 via INTRAVENOUS

## 2015-12-10 MED ORDER — CALCIUM CARBONATE-VITAMIN D 500-200 MG-UNIT PO TABS
1.0000 | ORAL_TABLET | Freq: Two times a day (BID) | ORAL | Status: DC
  Administered 2015-12-10 – 2015-12-11 (×3): 1 via ORAL
  Filled 2015-12-10 (×3): qty 1

## 2015-12-10 MED ORDER — ACETAMINOPHEN 325 MG PO TABS: 650.0000 mg | ORAL_TABLET | Freq: Four times a day (QID) | ORAL | Status: DC | PRN

## 2015-12-10 MED ORDER — NEPRO/CARBSTEADY PO LIQD
237.0000 mL | Freq: Three times a day (TID) | ORAL | Status: DC
  Administered 2015-12-10 – 2015-12-11 (×4): 237 mL via ORAL

## 2015-12-10 MED ORDER — ORAL CARE MOUTH RINSE
15.0000 mL | Freq: Two times a day (BID) | OROMUCOSAL | Status: DC
Start: 2015-12-10 — End: 2015-12-11
  Administered 2015-12-10 – 2015-12-11 (×3): 15 mL via OROMUCOSAL

## 2015-12-10 MED ORDER — ONDANSETRON HCL 4 MG PO TABS: 4.0000 mg | ORAL_TABLET | Freq: Four times a day (QID) | ORAL | Status: DC | PRN

## 2015-12-10 MED ORDER — HEPARIN SODIUM (PORCINE) PF 5000 UNIT/0.5ML IJ SOLN: 0.5000 mL | Freq: Three times a day (TID) | INTRAMUSCULAR | Status: DC

## 2015-12-10 MED ORDER — GABAPENTIN 100 MG PO CAPS
200.0000 mg | ORAL_CAPSULE | Freq: Every day | ORAL | Status: DC
  Administered 2015-12-10: 200 mg via ORAL
  Filled 2015-12-10: qty 2

## 2015-12-10 MED ORDER — RENA-VITE PO TABS
1.0000 | ORAL_TABLET | Freq: Every day | ORAL | Status: DC
  Administered 2015-12-10 – 2015-12-11 (×2): 1 via ORAL
  Filled 2015-12-10 (×2): qty 1

## 2015-12-10 MED ORDER — SODIUM CHLORIDE 0.9% FLUSH
3.0000 mL | Freq: Two times a day (BID) | INTRAVENOUS | Status: DC
Start: 2015-12-10 — End: 2015-12-11
  Administered 2015-12-10 – 2015-12-11 (×2): 3 mL via INTRAVENOUS

## 2015-12-10 NOTE — ED Provider Notes (Signed)
Patient reassessed at 11:45 PM after labs returned. Markedly leukocytosis of 21,000, persistent hypotension before IV fluids could be administered. I examined the patient and found that his sacral decub ulcer is unstageable due to necrotic tissue overlying much of the wound, but palpably feels like it is likely stage II. It is very tender and there is induration and evidence of cellulitis surrounding the wound. This is likely source of infection causing sepsis with hypotension and leukocytosis. Vancomycin and Zosyn were initiated as well as sending a lactic acid level and lying on 1.5 L bolus.  ----------------------------------------- 2:50 AM on 12/10/2015 -----------------------------------------  Patient has been fluid responsive, blood pressure quickly normalized and in fact no hypertensive after IV fluids which is likely his chronic baseline. [facility] was contacted to arrange transfer, but there reports that they're experiencing downtime other electronic medical records and will not be able to determine if they have space or are on diversion until 5 AM. We'll continue to board the patient until then.  ----------------------------------------- 2:53 AM on 12/10/2015 -----------------------------------------  Just received call from medical officer of the day at [facility]. He notes that their facility is currently on diversion for hemodialysis patients and they are unable to accept the patient and that we should admit the patient at our facility. Hospitalist paged.   CRITICAL CARE Performed by: Joni Fears, Janai Maudlin   Total critical care time: 35 minutes  Critical care time was exclusive of separately billable procedures and treating other patients.  Critical care was necessary to treat or prevent imminent or life-threatening deterioration.  Critical care was time spent personally by me on the following activities: development of treatment plan with patient and/or surrogate as  well as nursing, discussions with consultants, evaluation of patient's response to treatment, examination of patient, obtaining history from patient or surrogate, ordering and performing treatments and interventions, ordering and review of laboratory studies, ordering and review of radiographic studies, pulse oximetry and re-evaluation of patient's condition.    Final diagnoses:  Decubitus ulcer of sacral region, unspecified ulcer stage  Infected decubitus ulcer, unstageable ([place])  Sepsis, due to unspecified organism [facility])  End stage renal disease ([place])        Carrie Mew, MD 12/10/15 [phone]

## 2015-12-10 NOTE — Progress Notes (Signed)
Wound cleaned with sterile saline and sterile gauze applied to avf. Site is currently dry. Dressing left off of arterial access site.

## 2015-12-10 NOTE — Progress Notes (Signed)
Patient ID: Gregory Livingston, male   DOB: 10-11-1945, 70 y.o.   MRN: [id number]  [employer] PROGRESS NOTE  Semaje Kinker IRS:[id number] DOB: 1945/04/27 DOA: 12/18/2015 PCP: [place]  HPI/Subjective: Patient in pain with sacral decubiti. No shortness of breath or cough. No abdominal pain.  Objective: Vitals:   12/10/15 0452 12/10/15 0840  BP: (!) 95/30 (!) 95/47  Pulse: 67 74  Resp: 18 18  Temp: 97.7 F (36.5 C) 97.7 F (36.5 C)    Filed Weights   12/08/2015 2223 12/10/15 0452  Weight: 83.5 kg (184 lb) 85.2 kg (187 lb 12.8 oz)    ROS: Review of Systems  Constitutional: Negative for chills and fever.  Eyes: Negative for blurred vision.  Respiratory: Negative for cough and shortness of breath.   Cardiovascular: Negative for chest pain.  Gastrointestinal: Negative for abdominal pain, constipation, diarrhea, nausea and vomiting.  Genitourinary: Negative for dysuria.  Musculoskeletal: Positive for back pain. Negative for joint pain.  Neurological: Negative for dizziness and headaches.   Exam: Physical Exam  HENT:  Nose: No mucosal edema.  Mouth/Throat: No oropharyngeal exudate or posterior oropharyngeal edema.  Eyes: Conjunctivae, EOM and lids are normal. Pupils are equal, round, and reactive to light.  Neck: No JVD present. Carotid bruit is not present. No edema present. No thyroid mass and no thyromegaly present.  Cardiovascular: S1 normal and S2 normal.  Exam reveals no gallop.   No murmur heard. Pulses:      Dorsalis pedis pulses are 2+ on the right side, and 2+ on the left side.  Respiratory: No respiratory distress. He has no wheezes. He has no rhonchi. He has no rales.  GI: Soft. Bowel sounds are normal. There is no tenderness.  Musculoskeletal:       Right shoulder: He exhibits no swelling.  Lymphadenopathy:    He has no cervical adenopathy.  Neurological: He is alert. No cranial nerve deficit.  Skin: Skin is warm. Nails show no clubbing.   Unstageable necrotic sacral decubiti measuring about 8 cm x 5 cm  Psychiatric: He has a normal mood and affect.      Data Reviewed: Basic Metabolic Panel:  Recent Labs Lab 12/04/15 0930 12/18/2015 2315 12/10/15 0604  NA 133* 131* 132*  K 4.8 3.5 3.5  CL 96* 98* 99*  CO2 _0 GLUCOSE 112* 166* 152*  BUN 30* 28* 31*  CREATININE 3.66* 3.08* 3.25*  CALCIUM 8.6* 9.1 9.1   Liver Function Tests:  Recent Labs Lab 12/04/15 0930  AST 24  ALT 20  ALKPHOS 57  BILITOT 0.6  PROT 5.0*  ALBUMIN 2.0*   CBC:  Recent Labs Lab 12/04/15 0930 12/21/2015 2315 12/10/15 0604  WBC 10.3 21.7* 23.5*  NEUTROABS  --  19.4*  --   HGB 8.0* 8.7* 8.9*  HCT 25.1* 27.0* 27.5*  MCV 78.1* 79.7* 79.1*  PLT 153 126* 124*     Recent Results (from the past 240 hour(s))  Blood culture (routine x 2)     Status: None (Preliminary result)   Collection Time: 12/19/2015 11:15 PM  Result Value Ref Range Status   Specimen Description BLOOD RIGHT WRIST  Final   Special Requests BOTTLES DRAWN AEROBIC AND ANAEROBIC [id number]  Final   Culture NO GROWTH < 12 HOURS  Final   Report Status PENDING  Incomplete  Blood culture (routine x 2)     Status: None (Preliminary result)   Collection Time: 12/12/2015 11:31 PM  Result Value Ref Range  Status   Specimen Description BLOOD LEFT FOREARM  Final   Special Requests BOTTLES DRAWN AEROBIC AND ANAEROBIC [place]  Final   Culture NO GROWTH < 12 HOURS  Final   Report Status PENDING  Incomplete      Scheduled Meds: . aspirin  81 mg Oral Daily  . atorvastatin  40 mg Oral QHS  . bacitracin  1 application Topical Daily  . brimonidine  1 drop Left Eye TID  . calcium-vitamin D  1 tablet Oral BID  . cinacalcet  120 mg Oral Q breakfast  . epoetin (EPOGEN/PROCRIT) injection  10,000 Units Intravenous Q T,Th,Sa-HD  . feeding supplement (NEPRO CARB STEADY)  237 mL Oral TID  . folic acid  2 mg Oral Daily  . gabapentin  200 mg Oral QHS  . heparin  5,000  Units Subcutaneous Q8H  . mouth rinse  15 mL Mouth Rinse BID  . multivitamin  1 tablet Oral Daily  . pantoprazole  40 mg Oral BID AC  . piperacillin-tazobactam (ZOSYN)  IV  3.375 g Intravenous Q12H  . prednisoLONE acetate  2 drop Both Eyes BID  . ranolazine  500 mg Oral BID  . senna  2 tablet Oral Daily  . sevelamer carbonate  2,400 mg Oral TID WC  . sodium chloride flush  3 mL Intravenous Q12H  . sodium chloride flush  3 mL Intravenous Q12H  . [START ON 12/12/2015] vancomycin  1,000 mg Intravenous Q M,W,F-HD    Assessment/Plan:  1. Clinical sepsis with infected sacral decubiti, leukocytosis and hypotension. I spoke with the surgery Dr. Dahlia Byes to evaluate the patient for assessment of debridement. Hold Plavix. Can continue aspirin. Will hold heparin subcutaneous tomorrow morning. Check an ESR. Continue IV vancomycin and Zosyn at this point. 2. Hypotension. Start midodrine. Hold Coreg. 3. End-stage renal disease on dialysis. Dialysis as per nephrology. 4. History of congestive heart failure. No signs of heart failure at this time. 5. History of multiple myeloma 6. History of peripheral vascular disease 7. Hyperlipidemia unspecified on atorvastatin  8. Anemia of chronic disease on Procrit 9. Patient is a full code.  Code Status:     Code Status Orders        Start     Ordered   12/10/15 0337  Full code  Continuous     12/10/15 0337    Code Status History    Date Active Date Inactive Code Status Order ID Comments User Context   11/28/2015  4:30 PM 12/02/2015  9:57 PM Full Code [id number]  Lytle Butte, MD ED     Family Communication: Daughter at the bedside Disposition Plan: To be determined  Consultants:  Gen. surgery  Antibiotics:  Vancomycin  Zosyn  Time spent: 40 minutes total time with coordination of care and ACP  Loletha Grayer  [employer]

## 2015-12-10 NOTE — H&P (Signed)
[facility] [place] NAME: Gregory Livingston    MR#:  [id number]  DATE OF BIRTH:  1945/12/02  DATE OF ADMISSION:  12/07/2015  PRIMARY CARE PHYSICIAN: [place]   REQUESTING/REFERRING PHYSICIAN:   CHIEF COMPLAINT:   Chief Complaint  Patient presents with  . Hypotension    HISTORY OF PRESENT ILLNESS: Gregory Livingston  is a 70 y.o. male with a known history of Abdominal aortic aneurysm, diabetes mellitus type 2, end-stage renal disease on dialysis, hypertension, hyperparathyroidism, multiple myeloma, peptic ulcer disease, peripheral vascular disease is a patient of [facility]. Patient presented to the emergency room with low blood pressure. He was given IV fluids and resuscitated. Patient received more than 1.5 liter of IV fluids in the emergency room. He has a decubitus ulcer which appears to be infected and has necrotic tissue with cellulitis around the ulcer. Code sepsis protocol was started and patient received IV vancomycin and IV Zosyn antibiotics in the emergency room. No complaints of any chest pain, shortness of breath. No fever or chills. Patient has bilateral below-knee amputations. Blood pressure improved with IV fluids. Patient gets dialyzed on Monday when asked and Friday. [facility] could not take the patient and they are on diversion for dialysis patients. Hospitalist service was consulted for further care of the patient.  PAST MEDICAL HISTORY:   Past Medical History:  Diagnosis Date  . AAA (abdominal aortic aneurysm) ([place])   . CHF (congestive heart failure) ([place])   . Diabetes mellitus without complication ([place])   . Dialysis patient ([place])   . Hyperparathyroidism ([place])   . Hypertension   . Multiple myeloma ([place])   . Peptic ulcer with perforation ([place])   . PVD (peripheral vascular disease) ([place])     PAST SURGICAL HISTORY: Past Surgical History:  Procedure Laterality Date  . AV FISTULA PLACEMENT     . BELOW KNEE LEG AMPUTATION Bilateral   . bilateral below knee amputation    . PACEMAKER PLACEMENT      SOCIAL HISTORY:  Social History  Substance Use Topics  . Smoking status: Former Research scientist (life sciences)  . Smokeless tobacco: Never Used  . Alcohol use Yes    FAMILY HISTORY:  Family History  Problem Relation Age of Onset  . Hypertension Other     DRUG ALLERGIES:  Allergies  Allergen Reactions  . Ivp Dye [Iodinated Diagnostic Agents]     REVIEW OF SYSTEMS:   CONSTITUTIONAL: No fever, has weakness.  EYES: No blurred or double vision.  EARS, NOSE, AND THROAT: No tinnitus or ear pain.  RESPIRATORY: No cough, shortness of breath, wheezing or hemoptysis.  CARDIOVASCULAR: No chest pain, orthopnea, edema.  GASTROINTESTINAL: No nausea, vomiting, diarrhea or abdominal pain.  GENITOURINARY: No dysuria, hematuria.  ENDOCRINE: No polyuria, nocturia,  HEMATOLOGY: No anemia, easy bruising or bleeding SKIN: No rash or lesion. MUSCULOSKELETAL: Has bilateral below knee amputation Has back pain NEUROLOGIC: No tingling, numbness, weakness.  PSYCHIATRY: No anxiety or depression.   MEDICATIONS AT HOME:  Prior to Admission medications   Medication Sig Start Date End Date Taking? Authorizing Provider  acetaminophen (TYLENOL) 325 MG tablet Take 975 mg by mouth every 8 (eight) hours as needed.   Yes Historical Provider, MD  albuterol (PROVENTIL HFA;VENTOLIN HFA) 108 (90 Base) MCG/ACT inhaler Inhale 2 puffs into the lungs every 6 (six) hours as needed for shortness of breath.   Yes Historical Provider, MD  atorvastatin (LIPITOR) 40 MG tablet Take  40 mg by mouth at bedtime.   Yes Historical Provider, MD  B Complex-C-Folic Acid (RENAL VITAMIN PO) Take 1 tablet by mouth daily.   Yes Historical Provider, MD  bacitracin 500 UNIT/GM ointment Apply 1 application topically daily. Apply small amount topically to left knee abrasion daily   Yes Historical Provider, MD  brimonidine (ALPHAGAN) 0.2 % ophthalmic  solution Place 1 drop into the left eye 3 (three) times daily.   Yes Historical Provider, MD  calcium-vitamin D (OSCAL WITH D) 500-200 MG-UNIT tablet Take 1 tablet by mouth 2 (two) times daily.   Yes Historical Provider, MD  carvedilol (COREG) 25 MG tablet Take 12.5 mg by mouth every 12 (twelve) hours.    Yes Historical Provider, MD  cinacalcet (SENSIPAR) 60 MG tablet Take 120 mg by mouth daily. Take 120 mg every evening with food.   Yes Historical Provider, MD  Difluprednate (DUREZOL) 0.05 % EMUL Place 1 drop into both eyes daily.   Yes Historical Provider, MD  doxycycline (VIBRAMYCIN) 100 MG capsule Take 100 mg by mouth 2 (two) times daily.   Yes Historical Provider, MD  folic acid (FOLVITE) 1 MG tablet Take 2 mg by mouth daily.   Yes Historical Provider, MD  gabapentin (NEURONTIN) 100 MG capsule Take 200 mg by mouth at bedtime.   Yes Historical Provider, MD  hydroxypropyl methylcellulose / hypromellose (ISOPTO TEARS / GONIOVISC) 2.5 % ophthalmic solution Place 1 drop into both eyes 4 (four) times daily as needed for dry eyes.   Yes Historical Provider, MD  isosorbide mononitrate (IMDUR) 120 MG 24 hr tablet Take 1 tablet (120 mg total) by mouth daily. Patient taking differently: Take 240 mg by mouth daily.  12/02/15  Yes Vipul Manuella Ghazi, MD  nitroGLYCERIN (NITROSTAT) 0.4 MG SL tablet Place 0.4 mg under the tongue every 5 (five) minutes as needed for chest pain.   Yes Historical Provider, MD  Nutritional Supplements (FEEDING SUPPLEMENT, NEPRO CARB STEADY,) LIQD Take 237 mLs by mouth 3 (three) times daily.   Yes Historical Provider, MD  oxyCODONE (OXY IR/ROXICODONE) 5 MG immediate release tablet Take 5 mg by mouth every 4 (four) hours as needed for severe pain.   Yes Historical Provider, MD  pantoprazole (PROTONIX) 40 MG tablet Take 40 mg by mouth 2 (two) times daily.   Yes Historical Provider, MD  ranolazine (RANEXA) 500 MG 12 hr tablet Take 500 mg by mouth 2 (two) times daily.   Yes Historical Provider,  MD  senna (SENOKOT) 8.6 MG TABS tablet Take 2 tablets by mouth daily.   Yes Historical Provider, MD  sevelamer carbonate (RENVELA) 800 MG tablet Take 2,400 mg by mouth 3 (three) times daily with meals.   Yes Historical Provider, MD  aspirin 81 MG chewable tablet Chew 81 mg by mouth daily.    Historical Provider, MD  clopidogrel (PLAVIX) 75 MG tablet Take 75 mg by mouth daily.    Historical Provider, MD  Heparin Sodium, Porcine, PF 5000 UNIT/0.5ML SOLN Inject 0.5 mLs as directed every 8 (eight) hours.    Historical Provider, MD      PHYSICAL EXAMINATION:   VITAL SIGNS: Blood pressure (!) 177/107, pulse 65, temperature 98.4 F (36.9 C), temperature source Oral, resp. rate 17, height 5' 1" (1.549 m), weight 83.5 kg (184 lb), SpO2 96 %.  GENERAL:  70 y.o.-year-old patient lying in the bed with no acute distress.  EYES: Pupils equal, round, reactive to light and accommodation. No scleral icterus. Extraocular muscles intact.  HEENT: Head  atraumatic, normocephalic. Oropharynx dry and nasopharynx clear.  NECK:  Supple, no jugular venous distention. No thyroid enlargement, no tenderness.  LUNGS: Normal breath sounds bilaterally, no wheezing, rales,rhonchi or crepitation. No use of accessory muscles of respiration.  CARDIOVASCULAR: S1, S2 normal. No murmurs, rubs, or gallops.  ABDOMEN: Soft, nontender, nondistended. Bowel sounds present. No organomegaly or mass.  EXTREMITIES: Bilateral below knee amputation noted. Has right arm fistula NEUROLOGIC: Cranial nerves II through XII are intact. Muscle strength 5/5 in both upper extremities. Sensation intact. Gait not checked.  PSYCHIATRIC: The patient is alert and oriented x 3.  SKIN: Has sacral decubitus ulcer Necrotic tissue noted with surrounding erythema  LABORATORY PANEL:   CBC  Recent Labs Lab 12/04/15 0930 12/04/2015 2315  WBC 10.3 21.7*  HGB 8.0* 8.7*  HCT 25.1* 27.0*  PLT 153 126*  MCV 78.1* 79.7*  MCH 24.8* 25.7*  MCHC 31.8* 32.3   RDW 19.6* 19.2*  LYMPHSABS  --  1.1  MONOABS  --  1.1*  EOSABS  --  0.0  BASOSABS  --  0.0   ------------------------------------------------------------------------------------------------------------------  Chemistries   Recent Labs Lab 12/04/15 0930 01/01/2016 2315  NA 133* 131*  K 4.8 3.5  CL 96* 98*  CO2 29 26  GLUCOSE 112* 166*  BUN 30* 28*  CREATININE 3.66* 3.08*  CALCIUM 8.6* 9.1  AST 24  --   ALT 20  --   ALKPHOS 57  --   BILITOT 0.6  --    ------------------------------------------------------------------------------------------------------------------ estimated creatinine clearance is 19.9 mL/min (by C-G formula based on SCr of 3.08 mg/dL (H)). ------------------------------------------------------------------------------------------------------------------ No results for input(s): TSH, T4TOTAL, T3FREE, THYROIDAB in the last 72 hours.  Invalid input(s): FREET3   Coagulation profile No results for input(s): INR, PROTIME in the last 168 hours. ------------------------------------------------------------------------------------------------------------------- No results for input(s): DDIMER in the last 72 hours. -------------------------------------------------------------------------------------------------------------------  Cardiac Enzymes No results for input(s): CKMB, TROPONINI, MYOGLOBIN in the last 168 hours.  Invalid input(s): CK ------------------------------------------------------------------------------------------------------------------ Invalid input(s): POCBNP  ---------------------------------------------------------------------------------------------------------------  Urinalysis No results found for: COLORURINE, APPEARANCEUR, LABSPEC, PHURINE, GLUCOSEU, HGBUR, BILIRUBINUR, KETONESUR, PROTEINUR, UROBILINOGEN, NITRITE, LEUKOCYTESUR   RADIOLOGY: No results found.  EKG: Orders placed or performed during the hospital encounter of 12/04/15   . EKG 12-Lead  . EKG 12-Lead  . EKG 12-Lead  . EKG 12-Lead    IMPRESSION AND PLAN: 70 year old male patient with history of abdominal aortic aneurysm, type 2 diabetes mellitus, end-stage renal disease on dialysis, hypertension, decubitus ulcer presented to the emergency room with low blood pressure and weakness. Admitting diagnosis 1. Sepsis 2. Infected decubitus ulcer 3. Hypotension 4. End-stage renal disease on dialysis 5. Leukocytosis secondary to sepsis 6. Anemia of chronic disease Treatment plan Admit patient to medical floor inpatient service Nephrology consultation for dialysis Start patient on IV vancomycin and IV Zosyn antibiotics Patient received more than 2 L of IV fluids in the emergency room, we will hold blood pressure medication, monitor blood pressure closely and avoid fluid overload. DVT prophylaxis subcutaneous heparin Follow-up cultures and WBC count Wound care consultation for decubitus ulcer. Supportive care  All the records are reviewed and case discussed with ED provider. Management plans discussed with the patient, family and they are in agreement.  CODE STATUS:FULL CODE    Code Status Orders        Start     Ordered   12/10/15 0337  Full code  Continuous     12/10/15 0337    Code Status History    Date Active Date  Inactive Code Status Order ID Comments User Context   11/28/2015  4:30 PM 12/02/2015  9:57 PM Full Code [id number]  Lytle Butte, MD ED       TOTAL TIME TAKING CARE OF THIS PATIENT: 53 minutes.    Saundra Shelling M.D on 12/10/2015 at 3:39 AM  Between 7am to 6pm - Pager - [phone]  After 6pm go to [email or web address] - password [email or web address] [place] [facility]  Office  [phone]  CC: Primary care physician; [facility]

## 2015-12-10 NOTE — Progress Notes (Addendum)
[facility]  ROUNDING NOTE   Subjective:   Gregory Livingston admitted on 12/27/2015 for End stage renal disease ([place]) [N18.6] Infected decubitus ulcer, unstageable ([place]) [L89.95, L08.9] Sepsis, due to unspecified organism [facility]) [A41.9] Decubitus ulcer of sacral region, unspecified ulcer stage [L89.159]    Objective:  Vital signs in last 24 hours:  Temp:  [97.7 F (36.5 C)-98.4 F (36.9 C)] 97.7 F (36.5 C) (12/09 0840) Pulse Rate:  [62-74] 74 (12/09 0840) Resp:  [14-18] 18 (12/09 0840) BP: (81-177)/(30-107) 95/47 (12/09 0840) SpO2:  [96 %-100 %] 99 % (12/09 0840) Weight:  [83.5 kg (184 lb)-85.2 kg (187 lb 12.8 oz)] 85.2 kg (187 lb 12.8 oz) (12/09 0452)  Weight change:  Filed Weights   12/24/2015 2223 12/10/15 0452  Weight: 83.5 kg (184 lb) 85.2 kg (187 lb 12.8 oz)    Intake/Output: I/O last 3 completed shifts: In: 100 [IV Piggyback:100] Out: -    Intake/Output this shift:  Total I/O In: 120 [P.O.:120] Out: 0   Physical Exam: General: No acute distress  Head: Normocephalic, atraumatic. Moist oral mucosal membranes  Eyes: Anicteric  Neck: Supple, trachea midline  Lungs:  Clear to auscultation, normal effort  Heart: regular  Abdomen:  Soft, nontender, obese  Extremities: Bilateral BKA  Neurologic: Awake, alert, following commands  Skin: No lesions  Access: RUE AVF    Basic Metabolic Panel:  Recent Labs Lab 12/04/15 0930 12/16/2015 2315 12/10/15 0604  NA 133* 131* 132*  K 4.8 3.5 3.5  CL 96* 98* 99*  CO2 29 26 26   GLUCOSE 112* 166* 152*  BUN 30* 28* 31*  CREATININE 3.66* 3.08* 3.25*  CALCIUM 8.6* 9.1 9.1    Liver Function Tests:  Recent Labs Lab 12/04/15 0930  AST 24  ALT 20  ALKPHOS 57  BILITOT 0.6  PROT 5.0*  ALBUMIN 2.0*   No results for input(s): LIPASE, AMYLASE in the last 168 hours. No results for input(s): AMMONIA in the last 168 hours.  CBC:  Recent Labs Lab 12/04/15 0930 12/14/2015 2315 12/10/15 0604  WBC 10.3 21.7*  23.5*  NEUTROABS  --  19.4*  --   HGB 8.0* 8.7* 8.9*  HCT 25.1* 27.0* 27.5*  MCV 78.1* 79.7* 79.1*  PLT 153 126* 124*    Cardiac Enzymes: No results for input(s): CKTOTAL, CKMB, CKMBINDEX, TROPONINI in the last 168 hours.  BNP: Invalid input(s): POCBNP  CBG: No results for input(s): GLUCAP in the last 168 hours.  Microbiology: Results for orders placed or performed during the hospital encounter of 12/31/2015  Blood culture (routine x 2)     Status: None (Preliminary result)   Collection Time: 12/28/2015 11:15 PM  Result Value Ref Range Status   Specimen Description BLOOD RIGHT WRIST  Final   Special Requests BOTTLES DRAWN AEROBIC AND ANAEROBIC [id number],5CCANA  Final   Culture NO GROWTH < 12 HOURS  Final   Report Status PENDING  Incomplete  Blood culture (routine x 2)     Status: None (Preliminary result)   Collection Time: 12/14/2015 11:31 PM  Result Value Ref Range Status   Specimen Description BLOOD LEFT FOREARM  Final   Special Requests BOTTLES DRAWN AEROBIC AND ANAEROBIC [place]  Final   Culture NO GROWTH < 12 HOURS  Final   Report Status PENDING  Incomplete    Coagulation Studies: No results for input(s): LABPROT, INR in the last 72 hours.  Urinalysis: No results for input(s): COLORURINE, LABSPEC, PHURINE, GLUCOSEU, HGBUR, BILIRUBINUR, KETONESUR, PROTEINUR, UROBILINOGEN, NITRITE, LEUKOCYTESUR in  the last 72 hours.  Invalid input(s): APPERANCEUR    Imaging: No results found.   Medications:    . aspirin  81 mg Oral Daily  . atorvastatin  40 mg Oral QHS  . bacitracin  1 application Topical Daily  . brimonidine  1 drop Left Eye TID  . calcium-vitamin D  1 tablet Oral BID  . cinacalcet  120 mg Oral Q breakfast  . clopidogrel  75 mg Oral Daily  . feeding supplement (NEPRO CARB STEADY)  237 mL Oral TID  . folic acid  2 mg Oral Daily  . gabapentin  200 mg Oral QHS  . heparin  5,000 Units Subcutaneous Q8H  . mouth rinse  15 mL Mouth Rinse BID  . multivitamin   1 tablet Oral Daily  . pantoprazole  40 mg Oral BID AC  . piperacillin-tazobactam (ZOSYN)  IV  3.375 g Intravenous Q12H  . prednisoLONE acetate  2 drop Both Eyes BID  . ranolazine  500 mg Oral BID  . senna  2 tablet Oral Daily  . sevelamer carbonate  2,400 mg Oral TID WC  . sodium chloride flush  3 mL Intravenous Q12H  . sodium chloride flush  3 mL Intravenous Q12H  . [START ON 12/12/2015] vancomycin  1,000 mg Intravenous Q M,W,F-HD   sodium chloride, acetaminophen **OR** acetaminophen, albuterol, nitroGLYCERIN, ondansetron **OR** ondansetron (ZOFRAN) IV, oxyCODONE, polyvinyl alcohol, sodium chloride flush  Assessment/ Plan:  70 y.o. male with a PMHx of ESRD on HD at the [place], anemia chronic kidney disease, secondary hyperparathyroidism, aortic aneurysm, bilateral below the knee amputation, peptic ulcer disease  TTS [facility] AVF  1.  ESRD on HD: hemodialysis treatment for today.  Orders prepared  2. Sepsis: source seems to be infected decubitus ulcer. With hypotension and leukocytosis - empiric pip/tazo and vanco  3.  Anemia chronic kidney disease: hemoglobin 8.9. With thrombocytopenia - epo with HD treatment.   4.  Secondary hyperparathyroidism: PTH 156 on 11/29/15. Calcium and phosphorus at goal.  - Cinacalcet - sevelamer   LOS: 0 Claudia Greenley, [place] 12/9/201710:54 AM

## 2015-12-10 NOTE — Progress Notes (Signed)
Report given to Georgeanna Lea RN. Wound culture sent

## 2015-12-10 NOTE — Progress Notes (Signed)
Pharmacy Antibiotic Note  Gregory Livingston is a 70 y.o. male admitted on 12/07/2015 with sepsis.  Pharmacy has been consulted for Zosyn and vancomycin dosing.  Plan: 1. Zosyn 3.375 gm IV Q12H EI 2. Vancomycin 1 gm IV x 1 in ED, will give additional 500 mg IV x 1 for total load of 1.5 gm, then vancomycin 1 gm IV Q-dialysis MWF. Will check random vanc level 12/15 at 0600.   Height: 5\' 1"  (154.9 cm) Weight: 184 lb (83.5 kg) IBW/kg (Calculated) : 52.3  Temp (24hrs), Avg:98.4 F (36.9 C), Min:98.4 F (36.9 C), Max:98.4 F (36.9 C)   Recent Labs Lab 12/04/15 0930 12/08/2015 2315  WBC 10.3 21.7*  CREATININE 3.66* 3.08*    Estimated Creatinine Clearance: 19.9 mL/min (by C-G formula based on SCr of 3.08 mg/dL (H)).    Allergies  Allergen Reactions  . Ivp Dye [Iodinated Diagnostic Agents]     Thank you for allowing pharmacy to be a part of this patient's care.  Laural Benes, Pharm.D., BCPS Clinical Pharmacist 12/10/2015 12:08 AM

## 2015-12-10 NOTE — Progress Notes (Signed)
Patient arrived to 2A Room 259. Patient denies pain and all questions answered. Patient oriented to unit and call bell within reach. Skin assessment completed with Vincente Liberty RN; stage II decubitus ulcer noted to sacrum with foul odor and necrotic tissue along with bilateral BKA. A&Ox4, VSS, and NSR on verified tele-box #[id number]. Nursing staff will continue to monitor for any changes in patient status. Earleen Reaper, RN

## 2015-12-10 NOTE — Progress Notes (Signed)
Patient ID: Gregory Livingston, male   DOB: 11-26-1945, 70 y.o.   MRN: [id number]  PCP billing  Patient and daughter in the room  With infected decubiti and immobility, these decubiti are very difficult to treat and improve. Overall prognosis is poor. I explained that we will treat with antibiotics. He will likely need surgical intervention for debridement. He'll be followed long term to try to prevent this to worsen. Depending on how deep this decubiti is he may end up needing a plastic surgery procedure once infection treated. We will continue antibiotics for now.  Confirmed that the patient is a full code.  Time spent with a ACP conversation 17 minutes  Dr. Loletha Grayer

## 2015-12-10 NOTE — Consult Note (Signed)
Patient ID: Gregory Livingston, male   DOB: [birth year], 70 y.o.   MRN: [id number]  HPI Gregory Livingston is a 70 y.o. male with multiple comorbidities including dementia, CHF EF of 30%, end-stage renal disease on hemodialysis, relatively recent perforated ulcer at outside facility about to have weeks ago. Peripheral vascular disease and is status post bilateral below the knee amputation. He is in a nursing home facility and is not ambulatory. Presented with dehydration, hypotension and sepsis and admitted to the hospitalist service for sepsis workup. It have a white count of 23,000 with a left shift and was started on broad-spectrum antibiotics to include Zosyn and vancomycin. His pressure and overall status has improved some but he is very debilitated and demented. There is nobody with him at this point but I think there is a daughter or knees that it makes decisions for him. The history is taken from the nurse and also from the medical records. He was both on aspirin and Plavix for his peripheral vascular disease  HPI  Past Medical History:  Diagnosis Date  . AAA (abdominal aortic aneurysm) ([place])   . CHF (congestive heart failure) ([place])   . Diabetes mellitus without complication ([place])   . Dialysis patient ([place])   . Hyperparathyroidism ([place])   . Hypertension   . Multiple myeloma ([place])   . Peptic ulcer with perforation ([place])   . PVD (peripheral vascular disease) ([place])     Past Surgical History:  Procedure Laterality Date  . AV FISTULA PLACEMENT    . BELOW KNEE LEG AMPUTATION Bilateral   . bilateral below knee amputation    . PACEMAKER PLACEMENT      Family History  Problem Relation Age of Onset  . Hypertension Other     Social History Social History  Substance Use Topics  . Smoking status: Former Research scientist (life sciences)  . Smokeless tobacco: Never Used  . Alcohol use Yes    Allergies  Allergen Reactions  . Ivp Dye [Iodinated Diagnostic Agents]     Current Facility-Administered Medications   Medication Dose Route Frequency Provider Last Rate Last Dose  . 0.9 %  sodium chloride infusion  250 mL Intravenous PRN Saundra Shelling, MD      . acetaminophen (TYLENOL) tablet 650 mg  650 mg Oral Q6H PRN Saundra Shelling, MD       Or  . acetaminophen (TYLENOL) suppository 650 mg  650 mg Rectal Q6H PRN Pavan Pyreddy, MD      . albuterol (PROVENTIL) (2.5 MG/3ML) 0.083% nebulizer solution 3 mL  3 mL Inhalation Q6H PRN Pavan Pyreddy, MD      . aspirin chewable tablet 81 mg  81 mg Oral Daily Saundra Shelling, MD   81 mg at 12/10/15 0902  . atorvastatin (LIPITOR) tablet 40 mg  40 mg Oral QHS Pavan Pyreddy, MD      . bacitracin ointment 1 application  1 application Topical Daily Saundra Shelling, MD   1 application at 36/62/94 0905  . brimonidine (ALPHAGAN) 0.2 % ophthalmic solution 1 drop  1 drop Left Eye TID Saundra Shelling, MD   1 drop at 12/10/15 0903  . calcium-vitamin D (OSCAL WITH D) 500-200 MG-UNIT per tablet 1 tablet  1 tablet Oral BID Saundra Shelling, MD   1 tablet at 12/10/15 0904  . cinacalcet (SENSIPAR) tablet 120 mg  120 mg Oral Q breakfast Saundra Shelling, MD   120 mg at 12/10/15 0846  . epoetin alfa (EPOGEN,PROCRIT) injection 10,000 Units  10,000 Units Intravenous Q T,Th,Sa-HD Sarath Kolluru,  MD   10,000 Units at 12/10/15 1343  . feeding supplement (NEPRO CARB STEADY) liquid 237 mL  237 mL Oral TID Saundra Shelling, MD   237 mL at 12/10/15 1600  . folic acid (FOLVITE) tablet 2 mg  2 mg Oral Daily Saundra Shelling, MD   2 mg at 12/10/15 0908  . gabapentin (NEURONTIN) capsule 200 mg  200 mg Oral QHS Pavan Pyreddy, MD      . heparin injection 5,000 Units  5,000 Units Subcutaneous Q8H Saundra Shelling, MD   5,000 Units at 12/10/15 1501  . MEDLINE mouth rinse  15 mL Mouth Rinse BID Saundra Shelling, MD   15 mL at 12/10/15 1000  . midodrine (PROAMATINE) tablet 5 mg  5 mg Oral TID WC Loletha Grayer, MD   5 mg at 12/10/15 1215  . multivitamin (RENA-VIT) tablet 1 tablet  1 tablet Oral Daily Saundra Shelling, MD   1 tablet  at 12/10/15 0902  . nitroGLYCERIN (NITROSTAT) SL tablet 0.4 mg  0.4 mg Sublingual Q5 min PRN Pavan Pyreddy, MD      . ondansetron (ZOFRAN) tablet 4 mg  4 mg Oral Q6H PRN Saundra Shelling, MD       Or  . ondansetron (ZOFRAN) injection 4 mg  4 mg Intravenous Q6H PRN Pavan Pyreddy, MD      . oxyCODONE (Oxy IR/ROXICODONE) immediate release tablet 5 mg  5 mg Oral Q4H PRN Saundra Shelling, MD   5 mg at 12/10/15 0855  . pantoprazole (PROTONIX) EC tablet 40 mg  40 mg Oral BID AC Saundra Shelling, MD   40 mg at 12/10/15 0847  . piperacillin-tazobactam (ZOSYN) IVPB 3.375 g  3.375 g Intravenous Q12H Alexis Hugelmeyer, DO   3.375 g at 12/10/15 0901  . polyvinyl alcohol (LIQUIFILM TEARS) 1.4 % ophthalmic solution 1 drop  1 drop Both Eyes QID PRN Pavan Pyreddy, MD      . prednisoLONE acetate (PRED MILD) 0.12 % ophthalmic suspension 2 drop  2 drop Both Eyes BID Loletha Grayer, MD   2 drop at 12/10/15 1221  . ranolazine (RANEXA) 12 hr tablet 500 mg  500 mg Oral BID Saundra Shelling, MD   500 mg at 12/10/15 0902  . senna (SENOKOT) tablet 17.2 mg  2 tablet Oral Daily Saundra Shelling, MD   17.2 mg at 12/10/15 0902  . sevelamer carbonate (RENVELA) tablet 2,400 mg  2,400 mg Oral TID WC Saundra Shelling, MD   2,400 mg at 12/10/15 1215  . sodium chloride flush (NS) 0.9 % injection 3 mL  3 mL Intravenous Q12H Saundra Shelling, MD   3 mL at 12/10/15 0908  . sodium chloride flush (NS) 0.9 % injection 3 mL  3 mL Intravenous Q12H Pavan Pyreddy, MD      . sodium chloride flush (NS) 0.9 % injection 3 mL  3 mL Intravenous PRN Saundra Shelling, MD      . vancomycin (VANCOCIN) IVPB 1000 mg/200 mL premix  1,000 mg Intravenous Q T,Th,Sa-HD Loletha Grayer, MD   1,000 mg at 12/10/15 1434     Review of Systems Unable to obtain a full review of system given the patient's mentation and inability to answer complex questions  Physical Exam Blood pressure 95/62, pulse 68, temperature 97.6 F (36.4 C), temperature source Oral, resp. rate 19, height 5' 1"   (1.549 m), weight 97.1 kg (214 lb), SpO2 93 %. CONSTITUTIONAL: chronically ill and debilitated, awake, answers simple questions EYES: Pupils are equal, round, and reactive to light, Sclera are non-icteric.  EARS, NOSE, MOUTH AND THROAT: The oropharynx is clear. The oral mucosa is pink and moist. Hearing is intact to voice. LYMPH NODES:  Lymph nodes in the neck are normal. RESPIRATORY:  Lungs are clear. There is normal respiratory effort, with equal breath sounds bilaterally, and without pathologic use of accessory muscles. CARDIOVASCULAR: Heart is regular without murmurs, gallops, or rubs. GI: The abdomen is soft, nontender, and nondistended. There are no palpable masses. There is no hepatosplenomegaly. Laparotomy scar. GU: Rectal deferred.   MUSCULOSKELETAL: Bilateral BKA stumps   SKIN: Large decubitus ulcetr with black eschar measuring 15 x 13 cm. There is no evidence of necrotizing infection. It Is in close proximity to the anus NEUROLOGIC: Motor and sensation is grossly normal. Cranial nerves are grossly intact. PSYCH:  Oriented to person, place and time. Affect is normal.  Data Reviewed  I have personally reviewed the patient's imaging, laboratory findings and medical records.    Assessment Plan Debilitated and bedbound patient with multiple comorbidities and a large sacral decubitus ulcer in need for debridement at some point in time. Given the recent Plavix and aspirin will like to hold this for about 5 days. We'll likely do a debridement in the OR but mid of the week. In the meantime WE will recommend improvement of nutritional status, pressure relief, antibiotic therapy and him optimization of all the other comorbidities. Discussed with Dr. Leslye Peer in detail. No need for emergent surgical revision at this time   Caroleen Hamman, MD [facility] 12/10/2015, 4:36 PM

## 2015-12-10 NOTE — Progress Notes (Signed)
Hemodialysis- Pt brought to unit. VS stable. BP 95/62. Access bandages removed and venous site is draining foul green pus. Access is aneurysmal, with drainage coming from top center. Dr. Juleen China notified. Order to get blood and wound cultures and hold off on dialysis for now. Will send wound cultures now. No further orders received.

## 2015-12-11 ENCOUNTER — Encounter: Admission: EM | Disposition: E | Payer: Self-pay | Source: Home / Self Care | Attending: Internal Medicine

## 2015-12-11 ENCOUNTER — Inpatient Hospital Stay: Payer: Non-veteran care

## 2015-12-11 ENCOUNTER — Inpatient Hospital Stay: Payer: Non-veteran care | Admitting: Certified Registered Nurse Anesthetist

## 2015-12-11 ENCOUNTER — Encounter: Payer: Self-pay | Admitting: *Deleted

## 2015-12-11 ENCOUNTER — Other Ambulatory Visit: Payer: Medicare Other

## 2015-12-11 DIAGNOSIS — A419 Sepsis, unspecified organism: Principal | ICD-10-CM

## 2015-12-11 DIAGNOSIS — K2210 Ulcer of esophagus without bleeding: Secondary | ICD-10-CM

## 2015-12-11 DIAGNOSIS — K922 Gastrointestinal hemorrhage, unspecified: Secondary | ICD-10-CM

## 2015-12-11 DIAGNOSIS — L89150 Pressure ulcer of sacral region, unstageable: Secondary | ICD-10-CM

## 2015-12-11 DIAGNOSIS — J9621 Acute and chronic respiratory failure with hypoxia: Secondary | ICD-10-CM

## 2015-12-11 DIAGNOSIS — I469 Cardiac arrest, cause unspecified: Secondary | ICD-10-CM

## 2015-12-11 HISTORY — PX: ESOPHAGOGASTRODUODENOSCOPY (EGD) WITH PROPOFOL: SHX5813

## 2015-12-11 LAB — BLOOD GAS, ARTERIAL
ACID-BASE DEFICIT: 10.4 mmol/L — AB (ref 0.0–2.0)
ACID-BASE DEFICIT: 8.7 mmol/L — AB (ref 0.0–2.0)
Bicarbonate: 14 mmol/L — ABNORMAL LOW (ref 20.0–28.0)
Bicarbonate: 16.5 mmol/L — ABNORMAL LOW (ref 20.0–28.0)
FIO2: 1
FIO2: 1
MECHVT: 420 mL
Mechanical Rate: 16
O2 SAT: 99.9 %
O2 Saturation: 99.9 %
PCO2 ART: 26 mmHg — AB (ref 32.0–48.0)
PEEP: 5 cmH2O
PH ART: 7.34 — AB (ref 7.350–7.450)
PH ART: 7.32 — AB (ref 7.350–7.450)
PO2 ART: 317 mmHg — AB (ref 83.0–108.0)
Patient temperature: 37
Patient temperature: 37
pCO2 arterial: 32 mmHg (ref 32.0–48.0)
pO2, Arterial: 279 mmHg — ABNORMAL HIGH (ref 83.0–108.0)

## 2015-12-11 LAB — CBC
HEMATOCRIT: 31.3 % — AB (ref 40.0–52.0)
HEMATOCRIT: 29.6 % — AB (ref 40.0–52.0)
HEMOGLOBIN: 9.8 g/dL — AB (ref 13.0–18.0)
Hemoglobin: 10.1 g/dL — ABNORMAL LOW (ref 13.0–18.0)
MCH: 26.7 pg (ref 26.0–34.0)
MCH: 26.4 pg (ref 26.0–34.0)
MCHC: 32.2 g/dL (ref 32.0–36.0)
MCHC: 33 g/dL (ref 32.0–36.0)
MCV: 82.8 fL (ref 80.0–100.0)
MCV: 80.1 fL (ref 80.0–100.0)
PLATELETS: 122 10*3/uL — AB (ref 150–440)
Platelets: 134 10*3/uL — ABNORMAL LOW (ref 150–440)
RBC: 3.78 MIL/uL — AB (ref 4.40–5.90)
RBC: 3.69 MIL/uL — AB (ref 4.40–5.90)
RDW: 18.3 % — ABNORMAL HIGH (ref 11.5–14.5)
RDW: 18.1 % — ABNORMAL HIGH (ref 11.5–14.5)
WBC: 45.1 10*3/uL — ABNORMAL HIGH (ref 3.8–10.6)
WBC: 44 10*3/uL — AB (ref 3.8–10.6)

## 2015-12-11 LAB — GLUCOSE, CAPILLARY
GLUCOSE-CAPILLARY: 135 mg/dL — AB (ref 65–99)
GLUCOSE-CAPILLARY: 217 mg/dL — AB (ref 65–99)
Glucose-Capillary: 95 mg/dL (ref 65–99)

## 2015-12-11 LAB — PROTIME-INR
INR: 1.15
INR: 2.87
PROTHROMBIN TIME: 14.8 s (ref 11.4–15.2)
PROTHROMBIN TIME: 30.7 s — AB (ref 11.4–15.2)

## 2015-12-11 LAB — MRSA PCR SCREENING: MRSA BY PCR: NEGATIVE

## 2015-12-11 LAB — APTT
APTT: 46 s — AB (ref 24–36)
aPTT: 88 seconds — ABNORMAL HIGH (ref 24–36)

## 2015-12-11 LAB — MAGNESIUM: MAGNESIUM: 1.7 mg/dL (ref 1.7–2.4)

## 2015-12-11 LAB — RENAL FUNCTION PANEL
Albumin: 1.4 g/dL — ABNORMAL LOW (ref 3.5–5.0)
Anion gap: 12 (ref 5–15)
BUN: 41 mg/dL — ABNORMAL HIGH (ref 6–20)
CHLORIDE: 97 mmol/L — AB (ref 101–111)
CO2: 26 mmol/L (ref 22–32)
Calcium: 8.4 mg/dL — ABNORMAL LOW (ref 8.9–10.3)
Creatinine, Ser: 3.85 mg/dL — ABNORMAL HIGH (ref 0.61–1.24)
GFR, EST AFRICAN AMERICAN: 17 mL/min — AB (ref >60)
GFR, EST NON AFRICAN AMERICAN: 14 mL/min — AB (ref >60)
Glucose, Bld: 261 mg/dL — ABNORMAL HIGH (ref 65–99)
POTASSIUM: 5 mmol/L (ref 3.5–5.1)
Phosphorus: 3.6 mg/dL (ref 2.5–4.6)
Sodium: 135 mmol/L (ref 135–145)

## 2015-12-11 LAB — PROCALCITONIN: PROCALCITONIN: 1.65 ng/mL

## 2015-12-11 LAB — HEMOGLOBIN
HEMOGLOBIN: 8.8 g/dL — AB (ref 13.0–18.0)
Hemoglobin: 8.2 g/dL — ABNORMAL LOW (ref 13.0–18.0)

## 2015-12-11 LAB — LACTIC ACID, PLASMA: Lactic Acid, Venous: 4.7 mmol/L (ref 0.5–1.9)

## 2015-12-11 LAB — HEPATITIS B CORE ANTIBODY, TOTAL: HEP B C TOTAL AB: NEGATIVE

## 2015-12-11 LAB — PREPARE RBC (CROSSMATCH)

## 2015-12-11 LAB — FIBRIN DERIVATIVES D-DIMER (ARMC ONLY): Fibrin derivatives D-dimer (ARMC): 3477 — ABNORMAL HIGH (ref 0–499)

## 2015-12-11 LAB — HEPATITIS B SURFACE ANTIGEN: Hepatitis B Surface Ag: NEGATIVE

## 2015-12-11 LAB — HEPATITIS B SURFACE ANTIBODY,QUALITATIVE: Hep B S Ab: REACTIVE

## 2015-12-11 LAB — FIBRINOGEN: Fibrinogen: 90 mg/dL — CL (ref 210–475)

## 2015-12-11 SURGERY — ESOPHAGOGASTRODUODENOSCOPY (EGD) WITH PROPOFOL
Anesthesia: General

## 2015-12-11 MED ORDER — NOREPINEPHRINE BITARTRATE 1 MG/ML IV SOLN
4.0000 ug/min | INTRAVENOUS | Status: DC
  Administered 2015-12-11: 4 ug/min via INTRAVENOUS
  Administered 2015-12-11: 38 ug/min via INTRAVENOUS
  Filled 2015-12-11 (×2): qty 4

## 2015-12-11 MED ORDER — MIDODRINE HCL 5 MG PO TABS
10.0000 mg | ORAL_TABLET | Freq: Three times a day (TID) | ORAL | Status: DC
  Filled 2015-12-11: qty 2

## 2015-12-11 MED ORDER — SODIUM CHLORIDE 0.9 % FOR CRRT
INTRAVENOUS_CENTRAL | Status: DC | PRN
  Administered 2015-12-12: 03:00:00 via INTRAVENOUS_CENTRAL
  Filled 2015-12-11 (×2): qty 1000

## 2015-12-11 MED ORDER — SODIUM CHLORIDE 0.9 % IV SOLN
8.0000 mg/h | INTRAVENOUS | Status: DC
  Administered 2015-12-11 – 2015-12-14 (×5): 8 mg/h via INTRAVENOUS
  Filled 2015-12-11 (×8): qty 80

## 2015-12-11 MED ORDER — FENTANYL BOLUS VIA INFUSION
25.0000 ug | INTRAVENOUS | Status: DC | PRN
  Administered 2015-12-13 (×2): 25 ug via INTRAVENOUS
  Filled 2015-12-11: qty 50

## 2015-12-11 MED ORDER — PIPERACILLIN-TAZOBACTAM 3.375 G IVPB
3.3750 g | Freq: Three times a day (TID) | INTRAVENOUS | Status: DC
  Administered 2015-12-11 – 2015-12-14 (×8): 3.375 g via INTRAVENOUS
  Filled 2015-12-11 (×8): qty 50

## 2015-12-11 MED ORDER — INSULIN ASPART 100 UNIT/ML ~~LOC~~ SOLN
0.0000 [IU] | SUBCUTANEOUS | Status: DC
  Administered 2015-12-12 (×2): 5 [IU] via SUBCUTANEOUS
  Administered 2015-12-13 (×4): 3 [IU] via SUBCUTANEOUS
  Administered 2015-12-14: 5 [IU] via SUBCUTANEOUS
  Administered 2015-12-14 – 2015-12-15 (×2): 3 [IU] via SUBCUTANEOUS
  Administered 2015-12-15 (×2): 5 [IU] via SUBCUTANEOUS
  Administered 2015-12-15: 8 [IU] via SUBCUTANEOUS
  Filled 2015-12-11 (×2): qty 3
  Filled 2015-12-11: qty 2
  Filled 2015-12-11: qty 5
  Filled 2015-12-11: qty 3
  Filled 2015-12-11: qty 5
  Filled 2015-12-11: qty 3
  Filled 2015-12-11: qty 5
  Filled 2015-12-11: qty 3
  Filled 2015-12-11: qty 5
  Filled 2015-12-11: qty 8
  Filled 2015-12-11: qty 5

## 2015-12-11 MED ORDER — SODIUM CHLORIDE 0.9% FLUSH
10.0000 mL | Freq: Two times a day (BID) | INTRAVENOUS | Status: DC
  Administered 2015-12-11: 10 mL
  Administered 2015-12-12: 30 mL
  Administered 2015-12-12 – 2015-12-15 (×5): 10 mL

## 2015-12-11 MED ORDER — SODIUM CHLORIDE 0.9 % IV SOLN
80.0000 mg | Freq: Once | INTRAVENOUS | Status: AC
  Administered 2015-12-11: 17:00:00 80 mg via INTRAVENOUS
  Filled 2015-12-11: qty 80

## 2015-12-11 MED ORDER — PROPOFOL 10 MG/ML IV BOLUS
INTRAVENOUS | Status: DC | PRN
  Administered 2015-12-11: 20 mg via INTRAVENOUS
  Administered 2015-12-11: 50 mg via INTRAVENOUS

## 2015-12-11 MED ORDER — SODIUM CHLORIDE 0.9 % IV BOLUS (SEPSIS)
500.0000 mL | Freq: Once | INTRAVENOUS | Status: AC
  Administered 2015-12-11: 500 mL via INTRAVENOUS

## 2015-12-11 MED ORDER — EPINEPHRINE PF 1 MG/ML IJ SOLN
0.5000 ug/min | INTRAVENOUS | Status: DC
  Administered 2015-12-11: 4 ug/min via INTRAVENOUS
  Filled 2015-12-11 (×2): qty 4

## 2015-12-11 MED ORDER — MIDAZOLAM HCL 2 MG/2ML IJ SOLN: 1.0000 mg | INTRAMUSCULAR | Status: DC | PRN

## 2015-12-11 MED ORDER — SODIUM CHLORIDE 0.9 % IV SOLN
Freq: Once | INTRAVENOUS | Status: AC
  Administered 2015-12-11: 16:00:00 via INTRAVENOUS

## 2015-12-11 MED ORDER — EPINEPHRINE PF 1 MG/ML IJ SOLN
INTRAMUSCULAR | Status: DC | PRN
  Administered 2015-12-11: .8 mg via INTRAVENOUS
  Administered 2015-12-11: .2 mg via INTRAVENOUS

## 2015-12-11 MED ORDER — NOREPINEPHRINE BITARTRATE 1 MG/ML IV SOLN
4.0000 ug/min | INTRAVENOUS | Status: DC
  Filled 2015-12-11: qty 16

## 2015-12-11 MED ORDER — FENTANYL CITRATE (PF) 100 MCG/2ML IJ SOLN
50.0000 ug | Freq: Once | INTRAMUSCULAR | Status: AC
Start: 2015-12-11 — End: 2015-12-11
  Administered 2015-12-11: 50 ug via INTRAVENOUS

## 2015-12-11 MED ORDER — BISACODYL 10 MG RE SUPP: 10.0000 mg | Freq: Every day | RECTAL | Status: DC | PRN

## 2015-12-11 MED ORDER — ORAL CARE MOUTH RINSE: 15.0000 mL | Freq: Four times a day (QID) | OROMUCOSAL | Status: DC

## 2015-12-11 MED ORDER — SENNOSIDES 8.8 MG/5ML PO SYRP: 5.0000 mL | ORAL_SOLUTION | Freq: Two times a day (BID) | ORAL | Status: DC | PRN

## 2015-12-11 MED ORDER — SODIUM CHLORIDE 0.9% FLUSH: 10.0000 mL | INTRAVENOUS | Status: DC | PRN

## 2015-12-11 MED ORDER — HEPARIN SODIUM (PORCINE) 1000 UNIT/ML DIALYSIS
1000.0000 [IU] | INTRAMUSCULAR | Status: DC | PRN
  Administered 2015-12-12 (×2): 1300 [IU] via INTRAVENOUS_CENTRAL

## 2015-12-11 MED ORDER — PANTOPRAZOLE SODIUM 40 MG IV SOLR: 40.0000 mg | Freq: Two times a day (BID) | INTRAVENOUS | Status: DC

## 2015-12-11 MED ORDER — FENTANYL BOLUS VIA INFUSION
25.0000 ug | INTRAVENOUS | Status: DC | PRN
  Filled 2015-12-11: qty 25

## 2015-12-11 MED ORDER — ORAL CARE MOUTH RINSE
15.0000 mL | OROMUCOSAL | Status: DC
  Administered 2015-12-11 – 2015-12-15 (×40): 15 mL via OROMUCOSAL

## 2015-12-11 MED ORDER — PHENYLEPHRINE HCL 10 MG/ML IJ SOLN
INTRAMUSCULAR | Status: DC | PRN
  Administered 2015-12-11: 200 ug via INTRAVENOUS
  Administered 2015-12-11: 100 ug via INTRAVENOUS

## 2015-12-11 MED ORDER — MIDAZOLAM HCL 2 MG/2ML IJ SOLN
2.0000 mg | INTRAMUSCULAR | Status: DC | PRN
  Filled 2015-12-11 (×2): qty 4

## 2015-12-11 MED ORDER — IPRATROPIUM-ALBUTEROL 0.5-2.5 (3) MG/3ML IN SOLN: 3.0000 mL | Freq: Four times a day (QID) | RESPIRATORY_TRACT | Status: DC | PRN

## 2015-12-11 MED ORDER — MIDAZOLAM HCL 2 MG/2ML IJ SOLN
1.0000 mg | INTRAMUSCULAR | Status: DC | PRN
  Administered 2015-12-11: 1 mg via INTRAVENOUS
  Filled 2015-12-11: qty 2

## 2015-12-11 MED ORDER — SODIUM BICARBONATE 8.4 % IV SOLN
100.0000 meq | Freq: Once | INTRAVENOUS | Status: AC
Start: 2015-12-11 — End: 2015-12-11
  Administered 2015-12-11: 100 meq via INTRAVENOUS
  Filled 2015-12-11: qty 100

## 2015-12-11 MED ORDER — DEXTROSE 5 % IV SOLN
2.0000 ug/min | INTRAVENOUS | Status: DC
  Administered 2015-12-11: 45 ug/min via INTRAVENOUS
  Administered 2015-12-12: 65 ug/min via INTRAVENOUS
  Administered 2015-12-12: 32 ug/min via INTRAVENOUS
  Administered 2015-12-14: 27 ug/min via INTRAVENOUS
  Administered 2015-12-14: 50 ug/min via INTRAVENOUS
  Administered 2015-12-14: 26 ug/min via INTRAVENOUS
  Administered 2015-12-15: 47 ug/min via INTRAVENOUS
  Administered 2015-12-15: 60 ug/min via INTRAVENOUS
  Administered 2015-12-15: 44 ug/min via INTRAVENOUS
  Filled 2015-12-11 (×14): qty 16

## 2015-12-11 MED ORDER — DAKINS (1/4 STRENGTH) 0.125 % EX SOLN
Freq: Two times a day (BID) | CUTANEOUS | Status: AC
  Administered 2015-12-11 – 2015-12-12 (×2)
  Administered 2015-12-12: 1
  Administered 2015-12-13: 09:00:00
  Administered 2015-12-13: 1
  Administered 2015-12-13: 05:00:00
  Filled 2015-12-11: qty 473

## 2015-12-11 MED ORDER — MIDAZOLAM HCL 2 MG/2ML IJ SOLN
2.0000 mg | INTRAMUSCULAR | Status: DC | PRN
Start: 2015-12-11 — End: 2015-12-16
  Administered 2015-12-11: 2 mg via INTRAVENOUS

## 2015-12-11 MED ORDER — VASOPRESSIN 20 UNIT/ML IV SOLN
0.0400 [IU]/min | INTRAVENOUS | Status: DC
  Administered 2015-12-11 – 2015-12-14 (×3): 0.03 [IU]/min via INTRAVENOUS
  Administered 2015-12-15: 0.04 [IU]/min via INTRAVENOUS
  Filled 2015-12-11 (×6): qty 2

## 2015-12-11 MED ORDER — VANCOMYCIN HCL IN DEXTROSE 1-5 GM/200ML-% IV SOLN
1000.0000 mg | INTRAVENOUS | Status: DC
  Administered 2015-12-11 – 2015-12-12 (×2): 1000 mg via INTRAVENOUS
  Filled 2015-12-11 (×3): qty 200

## 2015-12-11 MED ORDER — PUREFLOW DIALYSIS SOLUTION
INTRAVENOUS | Status: DC
  Administered 2015-12-12: 03:00:00 via INTRAVENOUS_CENTRAL

## 2015-12-11 MED ORDER — CHLORHEXIDINE GLUCONATE 0.12% ORAL RINSE (MEDLINE KIT)
15.0000 mL | Freq: Two times a day (BID) | OROMUCOSAL | Status: DC
  Administered 2015-12-11 – 2015-12-15 (×8): 15 mL via OROMUCOSAL

## 2015-12-11 MED ORDER — SUCCINYLCHOLINE CHLORIDE 20 MG/ML IJ SOLN
INTRAMUSCULAR | Status: DC | PRN
  Administered 2015-12-11: 100 mg via INTRAVENOUS

## 2015-12-11 MED ORDER — FENTANYL 2500MCG IN NS 250ML (10MCG/ML) PREMIX INFUSION
25.0000 ug/h | INTRAVENOUS | Status: DC
  Administered 2015-12-11: 50 ug/h via INTRAVENOUS
  Administered 2015-12-12: 75 ug/h via INTRAVENOUS
  Filled 2015-12-11 (×2): qty 250

## 2015-12-11 MED ORDER — NOREPINEPHRINE BITARTRATE 1 MG/ML IV SOLN: 2.0000 ug/min | INTRAVENOUS | Status: DC

## 2015-12-11 NOTE — Progress Notes (Addendum)
eLink Physician-Brief Progress Note Patient Name: Gregory Livingston DOB: October 02, 1945 MRN: [id number]   Date of Service  12/30/2015  HPI/Events of Note  Given sedation for EGD. Then became hypotensive. Intubated for airway protection. Coded. Given EPI/Bagged/Sodium Bicarb 100 meq >> Rosc after about 10 minutes. Currently on a Norepinephrine iV infusion. Await labs sent peri code. ABG during code while bagged = 7.34/26/317/14.0  eICU Interventions  Will order: 1. Epinephrine IV infusion if needed.  2. Ventilator settings: 100%/PRVC 16/TV 8 mL/kg/P 5.  3. ABG at 8 PM. 4. Portable CXR STAT.   Dr. Stevenson Clinch, on call PCCM physician, notified.      Intervention Category Major Interventions: Code management / supervision  Sommer,Steven Eugene 12/10/2015, 7:03 PM

## 2015-12-11 NOTE — Interval H&P Note (Signed)
History and Physical Interval Note:  12/10/2015 6:41 PM  Gregory Livingston  has presented today for surgery, with the diagnosis of GI bleed  The various methods of treatment have been discussed with the patient and family. After consideration of risks, benefits and other options for treatment, the patient has consented to  Procedure(s): ESOPHAGOGASTRODUODENOSCOPY (EGD) WITH PROPOFOL (N/A) as a surgical intervention .  The patient's history has been reviewed, patient examined, no change in status, stable for surgery.  I have reviewed the patient's chart and labs.  Questions were answered to the patient's satisfaction.     [place] C.

## 2015-12-11 NOTE — Progress Notes (Signed)
X coverage ffor Dr Leslye Peer  Code blue was called since pt's pulse was thready. Received Epinephrine x1 and couple doses of IV bicarb and CPR initiated. Pt was already intubated after the GI procedure. Pt having lower gI bleed. So far 3 units of BT EGD showed gastric ulcer w/o bleeding On Levophed gtt at 32 mcgs Pt has poor IV access Central line placed.  Spoke with NP---will transfer service to [facility] with Dr Juleen China regarding CRRT---NP MAgdelena to change femoral line for Dialysis access  Pt's dter Latricia Heft has been informed by 2 nurses  Time critcal 30 mins

## 2015-12-11 NOTE — Plan of Care (Signed)
  Arrival Method: Bed accompanied by 2 RNs and CNA Mental Orientation: A&O x 3 with disorientation to situation Telemetry: Pt placed on monitor. Central tele and Elink aware of pt's transfer Assessment: Completed  Skin: unstageable decub to sacral with Dakins solution using wet to dry with abd pads IV:  22 LFA flushes easily, no pain, no blood return, Vascular MD started a femoral CVC on the right  Pt has Right upper arm fistula Pain: no pain  Environmental changes completed to facilitate rest and relaxation.  Safety Measures: Bed alarm obn, 2/4 bed rails up.  Unit Orientation: Pt has received patient guide, and taught how to use call bell system.  Family: Family  Notified of patient transfer   Pt was bleeding from rectum extensively He soaked through several pads and was laying in a pool of blood with clots

## 2015-12-11 NOTE — Progress Notes (Addendum)
Patient ID: Gregory Livingston, male   DOB: Oct 13, 1945, 70 y.o.   MRN: [id number]  Patient with multiple bowel movements of bright red blood and dark clots. I'm wondering if this could be an upper source since he had a procedure at the [facility] to repair a perforated ulcer back in November.  Case discussed with vascular surgery, gastroenterology, general surgery.   Transfuse 3 units of blood Serial hemoglobins Obtain stat acute abdomen and chest x-ray  Send PT PTT and INR  If no free air, endoscopy to be done this evening If free air general surgery will need to take to the OR.   The patient on levophed to maintain blood pressure for hypovolemic shock  Patient's abdomen is soft and nontender.  Patient is critically ill. Daughter called on the phone and given up-date. Time spent on care 45 minutes.

## 2015-12-11 NOTE — Progress Notes (Signed)
MEDICATION RELATED CONSULT NOTE - INITIAL   Pharmacy Consult for Medication adjustment for CRRT   Allergies  Allergen Reactions  . Ivp Dye [Iodinated Diagnostic Agents]     Labs:  Recent Labs  12/29/2015 2315 12/10/15 0604  12/08/2015 1434 12/02/2015 1613 12/24/2015 1848 12/16/2015 1852  WBC 21.7* 23.5*  --   --   --   --  45.1*  HGB 8.7* 8.9*  < > 8.2*  --  DUPICATE TEST.  CBC ORDERED [place] PER RN CAROLINE SOGOMO 1925 12/29/2015 10.1*  HCT 27.0* 27.5*  --   --   --   --  31.3*  PLT 126* 124*  --   --   --   --  122*  APTT  --   --   --   --  46*  --   --   CREATININE 3.08* 3.25*  --   --   --   --   --   < > = values in this interval not displayed. Estimated Creatinine Clearance: 20.4 mL/min (by C-G formula based on SCr of 3.25 mg/dL (H)).   Medical History: Past Medical History:  Diagnosis Date  . AAA (abdominal aortic aneurysm) ([place])   . CHF (congestive heart failure) ([place])   . Diabetes mellitus without complication ([place])   . Dialysis patient ([place])   . Hyperparathyroidism ([place])   . Hypertension   . Multiple myeloma ([place])   . Peptic ulcer with perforation ([place])   . PVD (peripheral vascular disease) [facility])     Assessment: 70 yo M on Hemodialysis with GI bleed, now to start CRRT. EGD- Pt with massive gastric ulcer, severe ulcerative esophagitis   Plan:  Vancomycin and Zosyn have been adjusted for CRRT dosing. See Antibiotic consult note.  Ranexa:  There are no dosage adjustments provided in the manufacturer's labeling. However, plasma ranolazine levels increased ~40% to 50% in patients with varying degrees of renal dysfunction. Discontinue if acute renal failure develops during treatment. Ranolazine has not been evaluated in patients requiring dialysis, although it is unlikely to be removed by hemodialysis due to plasma protein binding   Gregory Livingston 12/21/2015,9:38 PM

## 2015-12-11 NOTE — Plan of Care (Signed)
Right upper arm fistula has been covered by vascular md with tegaderm, HD is to stick above or below the tegaderm only  Resist sticking the same area multiple times

## 2015-12-11 NOTE — Progress Notes (Signed)
Pt has been cleaned of multiple BM's since arriving in ICU.  Bright Red blood with clotts.  Pt is hypotensive, 60/30's.  Dr. Leslye Peer aware, will order blood.  Paging Vascular MD for Central access.

## 2015-12-11 NOTE — Brief Op Note (Signed)
Massive distal clean-based gastric ulcer without any bleeding stigmata or visible vessel seen. Ulcer was very friable with a small amount of oozing of blood occurring from passage of the endoscope. Markedly edematous mucosa distal to the ulcer in the pre-pyloric channel. Severe ulcerative esophagitis in the mid to distal esophagus with a flat red spot seen in a ulcer at the GEJ. No active bleeding seen otherwise. Large amount of food in the proximal and body of the stomach prevented adequate visualization of the cardia and fundus of the stomach. Suspect the bleeding he had was small bowel or colonic since no blood products seen in the stomach and the large ulcer was clean-based. Ulcer is very high risk for complications such as perforation and bleeding. Continue Protonix drip. Intubated post-procedure by anesthesia due to worsening hypotension and further need for vasopressors. No role for any further endoscopic evaluation at this time. Continue aggressive volume support with blood transfusions and IVFs (as tolerated with end-stage renal disease on dialysis). Dr. Riki Rusk to f/u tomorrow for GI. Talked with daughter by phone following procedure and updated her on the EGD findings.

## 2015-12-11 NOTE — Progress Notes (Signed)
2345: Dr. Sudie Bailey from vascular surgery was consulted to place an HD catheter. VSS, will continue to monitor pt. Closely.  0020: HD cath placed in L fem. Next to A-line, a-line functioning well. Will start CRRT.

## 2015-12-11 NOTE — Progress Notes (Signed)
[facility]  ROUNDING NOTE   Subjective:   Purulent drainage from AVF. Unable to get dialysis yesterday due to this.   Patient laying in bed this morning. Unable to give much of a history.    Objective:  Vital signs in last 24 hours:  Temp:  [97.6 F (36.4 C)-98.6 F (37 C)] 98.2 F (36.8 C) (12/10 0931) Pulse Rate:  [62-68] 62 (12/10 0931) Resp:  [16-20] 16 (12/10 0931) BP: (94-109)/(39-62) 94/59 (12/10 0931) SpO2:  [93 %-96 %] 96 % (12/10 0931) Weight:  [97.1 kg (214 lb)] 97.1 kg (214 lb) (12/09 1318)  Weight change: 13.6 kg (30 lb) Filed Weights   12/16/2015 2223 12/10/15 0452 12/10/15 1318  Weight: 83.5 kg (184 lb) 85.2 kg (187 lb 12.8 oz) 97.1 kg (214 lb)    Intake/Output: I/O last 3 completed shifts: In: 30 [P.O.:170; IV Piggyback:400] Out: 0    Intake/Output this shift:  No intake/output data recorded.  Physical Exam: General: No acute distress  Head: Normocephalic, atraumatic. Moist oral mucosal membranes  Eyes: Anicteric  Neck: Supple, trachea midline  Lungs:  Clear to auscultation, normal effort  Heart: regular  Abdomen:  Soft, nontender, obese  Extremities: Bilateral BKA  Neurologic: Awake, alert, following commands  Skin: No lesions  Access: RUE AVF - ulcerations - no drainage     Basic Metabolic Panel:  Recent Labs Lab 12/05/2015 2315 12/10/15 0604  NA 131* 132*  K 3.5 3.5  CL 98* 99*  CO2 26 26  GLUCOSE 166* 152*  BUN 28* 31*  CREATININE 3.08* 3.25*  CALCIUM 9.1 9.1    Liver Function Tests: No results for input(s): AST, ALT, ALKPHOS, BILITOT, PROT, ALBUMIN in the last 168 hours. No results for input(s): LIPASE, AMYLASE in the last 168 hours. No results for input(s): AMMONIA in the last 168 hours.  CBC:  Recent Labs Lab 12/08/2015 2315 12/10/15 0604  WBC 21.7* 23.5*  NEUTROABS 19.4*  --   HGB 8.7* 8.9*  HCT 27.0* 27.5*  MCV 79.7* 79.1*  PLT 126* 124*    Cardiac Enzymes: No results for input(s): CKTOTAL, CKMB,  CKMBINDEX, TROPONINI in the last 168 hours.  BNP: Invalid input(s): POCBNP  CBG: No results for input(s): GLUCAP in the last 168 hours.  Microbiology: Results for orders placed or performed during the hospital encounter of 12/22/2015  Blood culture (routine x 2)     Status: None (Preliminary result)   Collection Time: 12/03/2015 11:15 PM  Result Value Ref Range Status   Specimen Description BLOOD RIGHT WRIST  Final   Special Requests BOTTLES DRAWN AEROBIC AND ANAEROBIC 6CCAERO,5CCANA  Final   Culture NO GROWTH 2 DAYS  Final   Report Status PENDING  Incomplete  Blood culture (routine x 2)     Status: None (Preliminary result)   Collection Time: 12/05/2015 11:31 PM  Result Value Ref Range Status   Specimen Description BLOOD LEFT FOREARM  Final   Special Requests BOTTLES DRAWN AEROBIC AND ANAEROBIC [place]  Final   Culture NO GROWTH 1 DAY  Final   Report Status PENDING  Incomplete  Aerobic/Anaerobic Culture (surgical/deep wound)     Status: None (Preliminary result)   Collection Time: 12/10/15 10:09 AM  Result Value Ref Range Status   Specimen Description DECUBITIS  Final   Special Requests Immunocompromised  Final   Gram Stain   Final    DEGENERATED CELLULAR MATERIAL PRESENT FEW SQUAMOUS EPITHELIAL CELLS PRESENT ABUNDANT GRAM NEGATIVE RODS MODERATE GRAM POSITIVE RODS FEW GRAM POSITIVE  COCCI IN PAIRS Performed at [facility]    Culture PENDING  Incomplete   Report Status PENDING  Incomplete  Aerobic Culture (superficial specimen)     Status: None (Preliminary result)   Collection Time: 12/10/15  1:34 PM  Result Value Ref Range Status   Specimen Description HEMODIALYSIS FISTULA  Final   Special Requests LEFT AVF  Final   Gram Stain   Final    MODERATE WBC PRESENT,BOTH PMN AND MONONUCLEAR NO ORGANISMS SEEN    Culture   Final    NO GROWTH < 24 HOURS Performed at [facility]    Report Status PENDING  Incomplete  Culture, blood (routine x 2)     Status:  None (Preliminary result)   Collection Time: 12/10/15  3:24 PM  Result Value Ref Range Status   Specimen Description BLOOD LEFT HAND  Final   Special Requests   Final    BOTTLES DRAWN AEROBIC AND ANAEROBIC  AER 6CC ANA 1CC   Culture NO GROWTH < 24 HOURS  Final   Report Status PENDING  Incomplete  Culture, blood (routine x 2)     Status: None (Preliminary result)   Collection Time: 12/10/15  3:33 PM  Result Value Ref Range Status   Specimen Description BLOOD RIGHT HAND  Final   Special Requests BOTTLES DRAWN AEROBIC ONLY  2CC  Final   Culture NO GROWTH < 24 HOURS  Final   Report Status PENDING  Incomplete    Coagulation Studies: No results for input(s): LABPROT, INR in the last 72 hours.  Urinalysis: No results for input(s): COLORURINE, LABSPEC, PHURINE, GLUCOSEU, HGBUR, BILIRUBINUR, KETONESUR, PROTEINUR, UROBILINOGEN, NITRITE, LEUKOCYTESUR in the last 72 hours.  Invalid input(s): APPERANCEUR    Imaging: No results found.   Medications:    . atorvastatin  40 mg Oral QHS  . bacitracin  1 application Topical Daily  . brimonidine  1 drop Left Eye TID  . calcium-vitamin D  1 tablet Oral BID  . cinacalcet  120 mg Oral Q breakfast  . epoetin (EPOGEN/PROCRIT) injection  10,000 Units Intravenous Q T,Th,Sa-HD  . feeding supplement (NEPRO CARB STEADY)  237 mL Oral TID  . folic acid  2 mg Oral Daily  . gabapentin  200 mg Oral QHS  . mouth rinse  15 mL Mouth Rinse BID  . midodrine  5 mg Oral TID WC  . multivitamin  1 tablet Oral Daily  . pantoprazole  40 mg Oral BID AC  . piperacillin-tazobactam (ZOSYN)  IV  3.375 g Intravenous Q12H  . prednisoLONE acetate  2 drop Both Eyes BID  . ranolazine  500 mg Oral BID  . senna  2 tablet Oral Daily  . sevelamer carbonate  2,400 mg Oral TID WC  . sodium chloride flush  3 mL Intravenous Q12H  . sodium chloride flush  3 mL Intravenous Q12H  . sodium hypochlorite   Irrigation BID  . vancomycin  1,000 mg Intravenous Q T,Th,Sa-HD   sodium  chloride, acetaminophen **OR** acetaminophen, albuterol, nitroGLYCERIN, ondansetron **OR** ondansetron (ZOFRAN) IV, oxyCODONE, polyvinyl alcohol, sodium chloride flush  Assessment/ Plan:  70 y.o. male with a PMHx of ESRD on HD at the [place], anemia chronic kidney disease, secondary hyperparathyroidism, aortic aneurysm, bilateral below the knee amputation, peptic ulcer disease  TTS [facility] AVF  1.  ESRD on HD: last dialysis was Thursday according to Daughter. No acute indication for dialysis. However with increasing edema Complication of dialysis device: purulent drainage yesterday. Wound  and blood cultures with no growth. Empiric antibiotics.  - Consult vascular surgery about AVF.  - Monitor daily for dialysis need.   2. Sepsis: source seems to be from infected decubitus ulcer or could be the fistula. With hypotension and leukocytosis - empiric pip/tazo and vanco - Consider ID consult  3.  Anemia chronic kidney disease: hemoglobin 8.9. With thrombocytopenia - epo with HD treatment.   4.  Secondary hyperparathyroidism: PTH 156 on 11/29/15. Calcium and phosphorus at goal.  - Cinacalcet - sevelamer   LOS: [place], [place] 12/10/201711:24 AM

## 2015-12-11 NOTE — Op Note (Signed)
[facility] Gastroenterology Patient Name: Gregory Livingston Procedure Date: 12/09/2015 5:50 PM MRN: [id number] Account #: [id number] Date of Birth: 1945-06-14 Admit Type: Inpatient Age: 70 Room: [facility] ENDO ROOM 4 Gender: Male Note Status: Finalized Procedure:            Upper GI endoscopy Indications:          Acute post hemorrhagic anemia, Hematochezia Providers:            Lear Ng, MD Referring MD:         [facility], MD (Referring MD) Medicines:            Propofol per Anesthesia, Monitored Anesthesia Care Complications:        No immediate complications. Procedure:            Pre-Anesthesia Assessment:                       - Prior to the procedure, a History and Physical was                        performed, and patient medications and allergies were                        reviewed. The patient's tolerance of previous                        anesthesia was also reviewed. The risks and benefits of                        the procedure and the sedation options and risks were                        discussed with the patient. All questions were                        answered, and informed consent was obtained. Prior                        Anticoagulants: The patient has taken no previous                        anticoagulant or antiplatelet agents. ASA Grade                        Assessment: IV - A patient with severe systemic disease                        that is a constant threat to life. After reviewing the                        risks and benefits, the patient was deemed in                        satisfactory condition to undergo the procedure.                       After obtaining informed consent, the endoscope was                        passed under direct vision. Throughout  the procedure,                        the patient's blood pressure, pulse, and oxygen                        saturations were monitored continuously. The Endoscope                         was introduced through the mouth, and advanced to the                        second part of duodenum. The upper GI endoscopy was                        somewhat difficult due to presence of food. Successful                        completion of the procedure was aided by straightening                        and shortening the scope to obtain bowel loop reduction                        and lavage. The patient tolerated the procedure well. Findings:      LA Grade D (one or more mucosal breaks involving at least 75% of       esophageal circumference) esophagitis with no bleeding was found in the       mid esophagus.      LA Grade C (one or more mucosal breaks continuous between tops of 2 or       more mucosal folds, less than 75% circumference) esophagitis with no       bleeding was found in the distal esophagus. Flat red spot seen on an       ulcer at GEJ.      One non-bleeding cratered gastric ulcer with no stigmata of bleeding was       found in the gastric antrum. The lesion was 30 mm in largest dimension.      Segmental severe inflammation characterized by congestion (edema) and       erosions was found in the prepyloric region of the stomach and at the       pylorus.      A large amount of food (residue) was found in the gastric fundus and in       the gastric body. Retroflexion limited by food in proximal stomach.      The examined duodenum was normal. Impression:           - LA Grade D reflux esophagitis.                       - LA Grade C reflux esophagitis.                       - Non-bleeding gastric ulcer with no stigmata of                        bleeding.                       - Acute gastritis.                       -  A large amount of food (residue) in the stomach.                       - Normal examined duodenum.                       - No specimens collected. Recommendation:       - NPO.                       - Give Protonix (pantoprazole): 8 mg/hr  IV by                        continuous infusion. Procedure Code(s):    --- Professional ---                       [phone], Esophagogastroduodenoscopy, flexible, transoral;                        diagnostic, including collection of specimen(s) by                        brushing or washing, when performed (separate procedure) Diagnosis Code(s):    --- Professional ---                       K92.1, Melena (includes Hematochezia)                       K25.9, Gastric ulcer, unspecified as acute or chronic,                        without hemorrhage or perforation                       K21.0, Gastro-esophageal reflux disease with esophagitis                       K29.00, Acute gastritis without bleeding                       D62, Acute posthemorrhagic anemia CPT copyright 2016 American Medical Association. All rights reserved. The codes documented in this report are preliminary and upon coder review may  be revised to meet current compliance requirements. Lear Ng, MD 12/24/2015 7:05:23 PM This report has been signed electronically. Number of Addenda: 0 Note Initiated On: 12/06/2015 5:50 PM      [facility]

## 2015-12-11 NOTE — Progress Notes (Signed)
2037: Starting HD cath placement procedure with Ms. Patria Mane, NP bedside. Versed given, fentanyl gtt running for sedation and pain. 3 pressors running- see EMR for details, ventilator @ 100% FiO2 support.  2129: unable to thread guidewire. Stopping procedure.

## 2015-12-11 NOTE — Progress Notes (Signed)
Patient ID: Gregory Livingston, male   DOB: 07/07/1945, 70 y.o.   MRN: [id number]   No free air seen on acute abdomen and chest x-ray. Case discussed with gastroenterology to set up for upper endoscopy  Patient still on pressors in hypovolemic shock. Patient is getting the third unit of blood now.  Hemoglobin to be drawn 1 hour after the third unit in for reassessment.  Dr. Loletha Grayer

## 2015-12-11 NOTE — H&P (View-Only) (Signed)
Referring Provider: Dr. Leslye Peer Primary Care Physician:  [facility] Primary Gastroenterologist: Althia Forts  Reason for Consultation:  GI bleed  HPI: Gregory Livingston is a 70 y.o. male with multiple medical problems as stated below who had a perforated ulcer last month with ex lap done at the [facility] (records not available). He had a bleeding antral gastric ulcer treated with hemoclips X 4 with hemostasis achieved. Examined duodenum was normal. He was admitted for sepsis with dehydration and leucocytosis. He was on Aspirin and Plavix prior to admit. He started having profuse bright red blood per rectum with clots and hypotension into the 03'T systolic. He is unable to give me any history. He is on hemodialysis. Spoke with daughter by phone.   Past Medical History:  Diagnosis Date  . AAA (abdominal aortic aneurysm) ([place])   . CHF (congestive heart failure) ([place])   . Diabetes mellitus without complication ([place])   . Dialysis patient ([place])   . Hyperparathyroidism ([place])   . Hypertension   . Multiple myeloma ([place])   . Peptic ulcer with perforation ([place])   . PVD (peripheral vascular disease) ([place])     Past Surgical History:  Procedure Laterality Date  . AV FISTULA PLACEMENT    . BELOW KNEE LEG AMPUTATION Bilateral   . bilateral below knee amputation    . PACEMAKER PLACEMENT      Prior to Admission medications   Medication Sig Start Date End Date Taking? Authorizing Provider  acetaminophen (TYLENOL) 325 MG tablet Take 975 mg by mouth every 8 (eight) hours as needed.   Yes Historical Provider, MD  albuterol (PROVENTIL HFA;VENTOLIN HFA) 108 (90 Base) MCG/ACT inhaler Inhale 2 puffs into the lungs every 6 (six) hours as needed for shortness of breath.   Yes Historical Provider, MD  atorvastatin (LIPITOR) 40 MG tablet Take 40 mg by mouth at bedtime.   Yes Historical Provider, MD  B Complex-C-Folic Acid (RENAL VITAMIN PO) Take 1 tablet by mouth daily.   Yes Historical Provider, MD   bacitracin 500 UNIT/GM ointment Apply 1 application topically daily. Apply small amount topically to left knee abrasion daily   Yes Historical Provider, MD  brimonidine (ALPHAGAN) 0.2 % ophthalmic solution Place 1 drop into the left eye 3 (three) times daily.   Yes Historical Provider, MD  calcium-vitamin D (OSCAL WITH D) 500-200 MG-UNIT tablet Take 1 tablet by mouth 2 (two) times daily.   Yes Historical Provider, MD  carvedilol (COREG) 25 MG tablet Take 12.5 mg by mouth every 12 (twelve) hours.    Yes Historical Provider, MD  cinacalcet (SENSIPAR) 60 MG tablet Take 120 mg by mouth daily. Take 120 mg every evening with food.   Yes Historical Provider, MD  Difluprednate (DUREZOL) 0.05 % EMUL Place 1 drop into both eyes daily.   Yes Historical Provider, MD  doxycycline (VIBRAMYCIN) 100 MG capsule Take 100 mg by mouth 2 (two) times daily.   Yes Historical Provider, MD  folic acid (FOLVITE) 1 MG tablet Take 2 mg by mouth daily.   Yes Historical Provider, MD  gabapentin (NEURONTIN) 100 MG capsule Take 200 mg by mouth at bedtime.   Yes Historical Provider, MD  hydroxypropyl methylcellulose / hypromellose (ISOPTO TEARS / GONIOVISC) 2.5 % ophthalmic solution Place 1 drop into both eyes 4 (four) times daily as needed for dry eyes.   Yes Historical Provider, MD  isosorbide mononitrate (IMDUR) 120 MG 24 hr tablet Take 1 tablet (120 mg total) by mouth daily. Patient taking differently:  Take 240 mg by mouth daily.  12/02/15  Yes Vipul Manuella Ghazi, MD  nitroGLYCERIN (NITROSTAT) 0.4 MG SL tablet Place 0.4 mg under the tongue every 5 (five) minutes as needed for chest pain.   Yes Historical Provider, MD  Nutritional Supplements (FEEDING SUPPLEMENT, NEPRO CARB STEADY,) LIQD Take 237 mLs by mouth 3 (three) times daily.   Yes Historical Provider, MD  oxyCODONE (OXY IR/ROXICODONE) 5 MG immediate release tablet Take 5 mg by mouth every 4 (four) hours as needed for severe pain.   Yes Historical Provider, MD  pantoprazole  (PROTONIX) 40 MG tablet Take 40 mg by mouth 2 (two) times daily.   Yes Historical Provider, MD  ranolazine (RANEXA) 500 MG 12 hr tablet Take 500 mg by mouth 2 (two) times daily.   Yes Historical Provider, MD  senna (SENOKOT) 8.6 MG TABS tablet Take 2 tablets by mouth daily.   Yes Historical Provider, MD  sevelamer carbonate (RENVELA) 800 MG tablet Take 2,400 mg by mouth 3 (three) times daily with meals.   Yes Historical Provider, MD  aspirin 81 MG chewable tablet Chew 81 mg by mouth daily.    Historical Provider, MD  clopidogrel (PLAVIX) 75 MG tablet Take 75 mg by mouth daily.    Historical Provider, MD  Heparin Sodium, Porcine, PF 5000 UNIT/0.5ML SOLN Inject 0.5 mLs as directed every 8 (eight) hours.    Historical Provider, MD    Scheduled Meds: . atorvastatin  40 mg Oral QHS  . bacitracin  1 application Topical Daily  . brimonidine  1 drop Left Eye TID  . calcium-vitamin D  1 tablet Oral BID  . cinacalcet  120 mg Oral Q breakfast  . epoetin (EPOGEN/PROCRIT) injection  10,000 Units Intravenous Q T,Th,Sa-HD  . feeding supplement (NEPRO CARB STEADY)  237 mL Oral TID  . folic acid  2 mg Oral Daily  . gabapentin  200 mg Oral QHS  . mouth rinse  15 mL Mouth Rinse BID  . midodrine  10 mg Oral TID WC  . multivitamin  1 tablet Oral Daily  . pantoprazole (PROTONIX) IVPB  80 mg Intravenous Once  . [START ON Jan 05, 2016] pantoprazole  40 mg Intravenous Q12H  . piperacillin-tazobactam (ZOSYN)  IV  3.375 g Intravenous Q12H  . prednisoLONE acetate  2 drop Both Eyes BID  . ranolazine  500 mg Oral BID  . senna  2 tablet Oral Daily  . sevelamer carbonate  2,400 mg Oral TID WC  . sodium chloride  500 mL Intravenous Once  . sodium chloride flush  3 mL Intravenous Q12H  . sodium chloride flush  3 mL Intravenous Q12H  . sodium hypochlorite   Irrigation BID  . vancomycin  1,000 mg Intravenous Q T,Th,Sa-HD   Continuous Infusions: . norepinephrine (LEVOPHED) Adult infusion 12 mcg/min (12/22/2015 1545)  .  pantoprozole (PROTONIX) infusion     PRN Meds:.sodium chloride, acetaminophen **OR** acetaminophen, albuterol, nitroGLYCERIN, ondansetron **OR** ondansetron (ZOFRAN) IV, oxyCODONE, polyvinyl alcohol, sodium chloride flush  Allergies as of 12/02/2015 - Review Complete 12/10/2015  Allergen Reaction Noted  . Ivp dye [iodinated diagnostic agents]  11/27/2015    Family History  Problem Relation Age of Onset  . Hypertension Other     Social History   Social History  . Marital status: Unknown    Spouse name: N/A  . Number of children: N/A  . Years of education: N/A   Occupational History  . retired    Social History Main Topics  . Smoking status: Former  Smoker  . Smokeless tobacco: Never Used  . Alcohol use Yes  . Drug use: No  . Sexual activity: Not on file   Other Topics Concern  . Not on file   Social History Narrative  . No narrative on file    Review of Systems: All negative except as stated above in HPI.  Physical Exam: Vital signs: Vitals:   12/16/2015 1610 12/20/2015 1620  BP: (!) 129/53   Pulse: 68   Resp: 17   Temp:  98.2 F (36.8 C)   Last BM Date: 01/01/2016 General:  Lethargic, demented, elderly, frail, +acute distress HEENT: anicteric sclera Lungs:  Clear throughout to auscultation anteriorly.   No wheezes, crackles, or rhonchi. No acute distress. Heart:  Regular rate and rhythm; no murmurs, clicks, rubs,  or gallops. Abdomen: epigastric tenderness with guarding, soft, nondistended, +BS  Rectal:  Deferred Ext: bilateral BKA  GI:  Lab Results:  Recent Labs  12/24/2015 2315 12/10/15 0604 12/05/2015 1219 12/03/2015 1434  WBC 21.7* 23.5*  --   --   HGB 8.7* 8.9* 8.8* 8.2*  HCT 27.0* 27.5*  --   --   PLT 126* 124*  --   --    BMET  Recent Labs  12/04/2015 2315 12/10/15 0604  NA 131* 132*  K 3.5 3.5  CL 98* 99*  CO2 26 26  GLUCOSE 166* 152*  BUN 28* 31*  CREATININE 3.08* 3.25*  CALCIUM 9.1 9.1   LFT No results for input(s): PROT, ALBUMIN,  AST, ALT, ALKPHOS, BILITOT, BILIDIR, IBILI in the last 72 hours. PT/INR No results for input(s): LABPROT, INR in the last 72 hours.   Studies/Results: No results found.  Impression/Plan: 70 yo with BRBPR and hypotension concerning for a peptic ulcer bleed. S/P surgery for a perforated ulcer last month at the [place]. Protonix drip. Stat CXR to look for free air. If free air, then will need surgery. If negative for free air, then EGD today to further evaluate for a source of the bleeding. Blood transfusions. Pressors. Discussed risks/benefits of the EGD with the patient's daughter by phone who gave consent for the EGD.     LOS: 1 day   [place] [facility].  12/26/2015, 4:28 PM

## 2015-12-11 NOTE — Consult Note (Signed)
Reason for Consult: Ulceration of Right AV fistula Referring Physician: Dr. Andi Hence Malter is an 70 y.o. male.  HPI: Patient with ESRD on HD via a right AV fistula, PVD- Bilateral BKAs, known AAA and recent UGI bleed/ perforation s/p exp lap. Consulted for concern of right AV fistula ulceration/infection. The patient has had HD without complication. Reported purulent drainage from fistula.  Past Medical History:  Diagnosis Date  . AAA (abdominal aortic aneurysm) ([place])   . CHF (congestive heart failure) ([place])   . Diabetes mellitus without complication ([place])   . Dialysis patient ([place])   . Hyperparathyroidism ([place])   . Hypertension   . Multiple myeloma ([place])   . Peptic ulcer with perforation ([place])   . PVD (peripheral vascular disease) ([place])     Past Surgical History:  Procedure Laterality Date  . AV FISTULA PLACEMENT    . BELOW KNEE LEG AMPUTATION Bilateral   . bilateral below knee amputation    . PACEMAKER PLACEMENT      Family History  Problem Relation Age of Onset  . Hypertension Other     Social History:  reports that he has quit smoking. He has never used smokeless tobacco. He reports that he drinks alcohol. He reports that he does not use drugs.  Allergies:  Allergies  Allergen Reactions  . Ivp Dye [Iodinated Diagnostic Agents]     Medications: I have reviewed the patient's current medications.  Results for orders placed or performed during the hospital encounter of 12/05/2015 (from the past 48 hour(s))  CBC with Differential     Status: Abnormal   Collection Time: 12/05/2015 11:15 PM  Result Value Ref Range   WBC 21.7 (H) 3.8 - 10.6 K/uL   RBC 3.38 (L) 4.40 - 5.90 MIL/uL   Hemoglobin 8.7 (L) 13.0 - 18.0 g/dL   HCT 27.0 (L) 40.0 - 52.0 %   MCV 79.7 (L) 80.0 - 100.0 fL   MCH 25.7 (L) 26.0 - 34.0 pg   MCHC 32.3 32.0 - 36.0 g/dL   RDW 19.2 (H) 11.5 - 14.5 %   Platelets 126 (L) 150 - 440 K/uL   Neutrophils Relative % 90 %   Neutro Abs 19.4 (H) 1.4 - 6.5 K/uL    Lymphocytes Relative 5 %   Lymphs Abs 1.1 1.0 - 3.6 K/uL   Monocytes Relative 5 %   Monocytes Absolute 1.1 (H) 0.2 - 1.0 K/uL   Eosinophils Relative 0 %   Eosinophils Absolute 0.0 0 - 0.7 K/uL   Basophils Relative 0 %   Basophils Absolute 0.0 0 - 0.1 K/uL  Basic metabolic panel     Status: Abnormal   Collection Time: 12/31/2015 11:15 PM  Result Value Ref Range   Sodium 131 (L) 135 - 145 mmol/L   Potassium 3.5 3.5 - 5.1 mmol/L   Chloride 98 (L) 101 - 111 mmol/L   CO2 26 22 - 32 mmol/L   Glucose, Bld 166 (H) 65 - 99 mg/dL   BUN 28 (H) 6 - 20 mg/dL   Creatinine, Ser 3.08 (H) 0.61 - 1.24 mg/dL   Calcium 9.1 8.9 - 10.3 mg/dL   GFR calc non Af Amer 19 (L) >60 mL/min   GFR calc Af Amer 22 (L) >60 mL/min    Comment: (NOTE) The eGFR has been calculated using the CKD EPI equation. This calculation has not been validated in all clinical situations. eGFR's persistently <60 mL/min signify possible Chronic Kidney Disease.    Anion gap 7 5 -  15  Blood culture (routine x 2)     Status: None (Preliminary result)   Collection Time: 12/29/2015 11:15 PM  Result Value Ref Range   Specimen Description BLOOD RIGHT WRIST    Special Requests BOTTLES DRAWN AEROBIC AND ANAEROBIC 6CCAERO,5CCANA    Culture NO GROWTH 2 DAYS    Report Status PENDING   Blood culture (routine x 2)     Status: None (Preliminary result)   Collection Time: 12/03/2015 11:31 PM  Result Value Ref Range   Specimen Description BLOOD LEFT FOREARM    Special Requests BOTTLES DRAWN AEROBIC AND ANAEROBIC [place]    Culture NO GROWTH 1 DAY    Report Status PENDING   Lactic acid, plasma     Status: None   Collection Time: 12/10/15 12:44 AM  Result Value Ref Range   Lactic Acid, Venous 1.9 0.5 - 1.9 mmol/L  Basic metabolic panel     Status: Abnormal   Collection Time: 12/10/15  6:04 AM  Result Value Ref Range   Sodium 132 (L) 135 - 145 mmol/L   Potassium 3.5 3.5 - 5.1 mmol/L   Chloride 99 (L) 101 - 111 mmol/L   CO2 26 22 -  32 mmol/L   Glucose, Bld 152 (H) 65 - 99 mg/dL   BUN 31 (H) 6 - 20 mg/dL   Creatinine, Ser 3.25 (H) 0.61 - 1.24 mg/dL   Calcium 9.1 8.9 - 10.3 mg/dL   GFR calc non Af Amer 18 (L) >60 mL/min   GFR calc Af Amer 20 (L) >60 mL/min    Comment: (NOTE) The eGFR has been calculated using the CKD EPI equation. This calculation has not been validated in all clinical situations. eGFR's persistently <60 mL/min signify possible Chronic Kidney Disease.    Anion gap 7 5 - 15  CBC     Status: Abnormal   Collection Time: 12/10/15  6:04 AM  Result Value Ref Range   WBC 23.5 (H) 3.8 - 10.6 K/uL   RBC 3.48 (L) 4.40 - 5.90 MIL/uL   Hemoglobin 8.9 (L) 13.0 - 18.0 g/dL   HCT 27.5 (L) 40.0 - 52.0 %   MCV 79.1 (L) 80.0 - 100.0 fL   MCH 25.6 (L) 26.0 - 34.0 pg   MCHC 32.4 32.0 - 36.0 g/dL   RDW 19.4 (H) 11.5 - 14.5 %   Platelets 124 (L) 150 - 440 K/uL  Aerobic/Anaerobic Culture (surgical/deep wound)     Status: None (Preliminary result)   Collection Time: 12/10/15 10:09 AM  Result Value Ref Range   Specimen Description DECUBITIS    Special Requests Immunocompromised    Gram Stain      DEGENERATED CELLULAR MATERIAL PRESENT FEW SQUAMOUS EPITHELIAL CELLS PRESENT ABUNDANT GRAM NEGATIVE RODS MODERATE GRAM POSITIVE RODS FEW GRAM POSITIVE COCCI IN PAIRS    Culture      CULTURE REINCUBATED FOR BETTER GROWTH Performed at [facility]    Report Status PENDING   Aerobic Culture (superficial specimen)     Status: None (Preliminary result)   Collection Time: 12/10/15  1:34 PM  Result Value Ref Range   Specimen Description HEMODIALYSIS FISTULA    Special Requests LEFT AVF    Gram Stain      MODERATE WBC PRESENT,BOTH PMN AND MONONUCLEAR NO ORGANISMS SEEN    Culture      NO GROWTH < 24 HOURS Performed at [facility]    Report Status PENDING   Hepatitis B surface antigen     Status:  None   Collection Time: 12/10/15  3:24 PM  Result Value Ref Range   Hepatitis B Surface Ag Negative  Negative    Comment: (NOTE) Performed At: [facility] [place], [place] [id number] Lindon Romp MD [id number]   Hepatitis B surface antibody     Status: None   Collection Time: 12/10/15  3:24 PM  Result Value Ref Range   Hep B S Ab Reactive     Comment: (NOTE)              Non Reactive: Inconsistent with immunity,                            less than 10 mIU/mL              Reactive:     Consistent with immunity,                            greater than 9.9 mIU/mL Performed At: [facility] [place], [place] [id number] Lindon Romp MD [id number]   Hepatitis B core antibody, total     Status: None   Collection Time: 12/10/15  3:24 PM  Result Value Ref Range   Hep B Core Total Ab Negative Negative    Comment: (NOTE) Performed At: [facility] [address] [place], [place] [id number] Lindon Romp MD [id number]   Culture, blood (routine x 2)     Status: None (Preliminary result)   Collection Time: 12/10/15  3:24 PM  Result Value Ref Range   Specimen Description BLOOD LEFT HAND    Special Requests      BOTTLES DRAWN AEROBIC AND ANAEROBIC  AER 6CC ANA 1CC   Culture NO GROWTH < 24 HOURS    Report Status PENDING   Culture, blood (routine x 2)     Status: None (Preliminary result)   Collection Time: 12/10/15  3:33 PM  Result Value Ref Range   Specimen Description BLOOD RIGHT HAND    Special Requests BOTTLES DRAWN AEROBIC ONLY  2CC    Culture NO GROWTH < 24 HOURS    Report Status PENDING   Hemoglobin     Status: Abnormal   Collection Time: 12/08/2015 12:19 PM  Result Value Ref Range   Hemoglobin 8.8 (L) 13.0 - 18.0 g/dL  Glucose, capillary     Status: Abnormal   Collection Time: 01/01/2016  1:47 PM  Result Value Ref Range   Glucose-Capillary 135 (H) 65 - 99 mg/dL  MRSA PCR Screening     Status: None   Collection Time: 12/14/2015  2:23 PM  Result Value Ref Range   MRSA by PCR NEGATIVE NEGATIVE    Comment:         The GeneXpert MRSA Assay (FDA approved for NASAL specimens only), is one component of a comprehensive MRSA colonization surveillance program. It is not intended to diagnose MRSA infection nor to guide or monitor treatment for MRSA infections.   Type and screen [place]     Status: None (Preliminary result)   Collection Time: 12/24/2015  2:34 PM  Result Value Ref Range   ISSUE DATE / TIME 800349179150    Blood Product Unit Number [id number]    PRODUCT CODE [id number]    Unit Type and Rh 5100    Blood Product Expiration Date 675449201007    Blood Product Unit Number [id number]  Unit Type and Rh 5100    Blood Product Expiration Date 638453646803    Blood Product Unit Number [id number]    Unit Type and Rh 5100    Blood Product Expiration Date 201712202359    ISSUE DATE / TIME 488891694503    Blood Product Unit Number [id number]    PRODUCT CODE [id number]    Unit Type and Rh 5100    Blood Product Expiration Date 480165537482   Hemoglobin     Status: Abnormal   Collection Time: 12/26/2015  2:34 PM  Result Value Ref Range   Hemoglobin 8.2 (L) 13.0 - 18.0 g/dL  Prepare RBC     Status: None   Collection Time: 12/20/2015  3:09 PM  Result Value Ref Range   Order Confirmation ORDER PROCESSED BY BLOOD BANK   Prepare RBC     Status: None   Collection Time: 12/17/2015  4:30 PM  Result Value Ref Range   Order Confirmation ORDER PROCESSED BY BLOOD BANK     No results found.  Review of Systems  Constitutional: Negative for fever.  HENT: Negative.   Eyes: Negative.   Respiratory: Negative.   Cardiovascular: Negative.   Gastrointestinal: Positive for abdominal pain and blood in stool.  Genitourinary: Negative.   Musculoskeletal: Negative.   Skin: Negative.   Neurological: Negative.    Blood pressure (!) 135/50, pulse 67, temperature 98.3 F (36.8 C), temperature source Axillary, resp. rate 13, height 5' 1"  (1.549 m), weight 97.1 kg (214 lb), SpO2  99 %. Physical Exam  Vitals reviewed. Constitutional: He appears well-developed.  HENT:  Head: Normocephalic.  Neck: No JVD present.  Cardiovascular: Normal rate and regular rhythm.   Right AV fistula +thrill/bruit. Ulceration/thinning skin over fistula sites. No bleeding. No drainage. Hand warm.  Respiratory: Effort normal and breath sounds normal.  GI: Soft. He exhibits no distension. There is no tenderness. There is no rebound and no guarding.  Musculoskeletal: Normal range of motion.  Neurological: He is alert.  Skin: Skin is warm.    Assessment/Plan: Right Av fistula Ulceration, no evidence of drainage upon current exam  AV Fistula Duplex.  May require revision  Continue HD AWAY from ulcerated sites. Follow cultures.   Patient now critically ill. Will follow.  Patient subsequently transferred to ICU for Acute bright red blood and hemorrhage per rectum. SBP 60s. Unstable. Asked to place central venous access and A Line emergently.  Patient required emergent access. Unstable. Consent not obtained.  Right femoral Central line and Aline placed under sterile conditions via the seldinger technique. Patient tolerated procedures. No immediate complications. Patient remains critical.  Hazelene Doten A 12/08/2015, 4:12 PM

## 2015-12-11 NOTE — Plan of Care (Signed)
Pt did not void he is anuric

## 2015-12-11 NOTE — Progress Notes (Signed)
Patient ID: Gregory Livingston, male   DOB: 1945-04-10, 70 y.o.   MRN: [id number]  [employer] PROGRESS NOTE  Gregory Livingston HEN:[id number] DOB: March 05, 1945 DOA: 12/26/2015 PCP: [facility]  HPI/Subjective: This morning the patient had a little bright red blood per rectum and they were looking at his wounds. Called just now with a large amount of bright red blood with darker clots coming out of the rectum. Patient feels okay and offers no complaints of abdominal pain or nausea or vomiting. Does have pain at the site of his sacral decubiti.  Objective: Vitals:   12/30/2015 1135 12/18/2015 1201  BP: (!) 72/42 (!) 91/39  Pulse: 60   Resp: 16   Temp: 98 F (36.7 C)     Filed Weights   12/07/2015 2223 12/10/15 0452 12/10/15 1318  Weight: 83.5 kg (184 lb) 85.2 kg (187 lb 12.8 oz) 97.1 kg (214 lb)    ROS: Review of Systems  Constitutional: Negative for chills and fever.  Eyes: Negative for blurred vision.  Respiratory: Negative for cough and shortness of breath.   Cardiovascular: Negative for chest pain.  Gastrointestinal: Positive for blood in stool. Negative for abdominal pain, constipation, diarrhea, nausea and vomiting.  Genitourinary: Negative for dysuria.  Musculoskeletal: Positive for back pain. Negative for joint pain.  Neurological: Negative for dizziness and headaches.   Exam: Physical Exam  HENT:  Nose: No mucosal edema.  Mouth/Throat: No oropharyngeal exudate or posterior oropharyngeal edema.  Eyes: Conjunctivae, EOM and lids are normal. Pupils are equal, round, and reactive to light.  Neck: No JVD present. Carotid bruit is not present. No edema present. No thyroid mass and no thyromegaly present.  Cardiovascular: S1 normal and S2 normal.  Exam reveals no gallop.   No murmur heard. Pulses:      Dorsalis pedis pulses are 2+ on the right side, and 2+ on the left side.  Respiratory: No respiratory distress. He has no wheezes. He has no rhonchi. He has no rales.  GI:  Soft. Bowel sounds are normal. There is no tenderness.  Musculoskeletal:       Right shoulder: He exhibits no swelling.  Lymphadenopathy:    He has no cervical adenopathy.  Neurological: He is alert. No cranial nerve deficit.  Skin: Skin is warm. Nails show no clubbing.  Unstageable necrotic sacral decubiti measuring about 8 cm x 5 cm  Psychiatric: He has a normal mood and affect.      Data Reviewed: Basic Metabolic Panel:  Recent Labs Lab 12/06/2015 2315 12/10/15 0604  NA 131* 132*  K 3.5 3.5  CL 98* 99*  CO2 26 26  GLUCOSE 166* 152*  BUN 28* 31*  CREATININE 3.08* 3.25*  CALCIUM 9.1 9.1   CBC:  Recent Labs Lab 12/28/2015 2315 12/10/15 0604 12/10/2015 1219  WBC 21.7* 23.5*  --   NEUTROABS 19.4*  --   --   HGB 8.7* 8.9* 8.8*  HCT 27.0* 27.5*  --   MCV 79.7* 79.1*  --   PLT 126* 124*  --      Recent Results (from the past 240 hour(s))  Blood culture (routine x 2)     Status: None (Preliminary result)   Collection Time: 12/21/2015 11:15 PM  Result Value Ref Range Status   Specimen Description BLOOD RIGHT WRIST  Final   Special Requests BOTTLES DRAWN AEROBIC AND ANAEROBIC 6CCAERO,5CCANA  Final   Culture NO GROWTH 2 DAYS  Final   Report Status PENDING  Incomplete  Blood culture (  routine x 2)     Status: None (Preliminary result)   Collection Time: 12/31/2015 11:31 PM  Result Value Ref Range Status   Specimen Description BLOOD LEFT FOREARM  Final   Special Requests BOTTLES DRAWN AEROBIC AND ANAEROBIC [place]  Final   Culture NO GROWTH 1 DAY  Final   Report Status PENDING  Incomplete  Aerobic/Anaerobic Culture (surgical/deep wound)     Status: None (Preliminary result)   Collection Time: 12/10/15 10:09 AM  Result Value Ref Range Status   Specimen Description DECUBITIS  Final   Special Requests Immunocompromised  Final   Gram Stain   Final    DEGENERATED CELLULAR MATERIAL PRESENT FEW SQUAMOUS EPITHELIAL CELLS PRESENT ABUNDANT GRAM NEGATIVE RODS MODERATE GRAM  POSITIVE RODS FEW GRAM POSITIVE COCCI IN PAIRS    Culture   Final    CULTURE REINCUBATED FOR BETTER GROWTH Performed at [facility]    Report Status PENDING  Incomplete  Aerobic Culture (superficial specimen)     Status: None (Preliminary result)   Collection Time: 12/10/15  1:34 PM  Result Value Ref Range Status   Specimen Description HEMODIALYSIS FISTULA  Final   Special Requests LEFT AVF  Final   Gram Stain   Final    MODERATE WBC PRESENT,BOTH PMN AND MONONUCLEAR NO ORGANISMS SEEN    Culture   Final    NO GROWTH < 24 HOURS Performed at [facility]    Report Status PENDING  Incomplete  Culture, blood (routine x 2)     Status: None (Preliminary result)   Collection Time: 12/10/15  3:24 PM  Result Value Ref Range Status   Specimen Description BLOOD LEFT HAND  Final   Special Requests   Final    BOTTLES DRAWN AEROBIC AND ANAEROBIC  AER 6CC ANA 1CC   Culture NO GROWTH < 24 HOURS  Final   Report Status PENDING  Incomplete  Culture, blood (routine x 2)     Status: None (Preliminary result)   Collection Time: 12/10/15  3:33 PM  Result Value Ref Range Status   Specimen Description BLOOD RIGHT HAND  Final   Special Requests BOTTLES DRAWN AEROBIC ONLY  2CC  Final   Culture NO GROWTH < 24 HOURS  Final   Report Status PENDING  Incomplete      Scheduled Meds: . atorvastatin  40 mg Oral QHS  . bacitracin  1 application Topical Daily  . brimonidine  1 drop Left Eye TID  . calcium-vitamin D  1 tablet Oral BID  . cinacalcet  120 mg Oral Q breakfast  . epoetin (EPOGEN/PROCRIT) injection  10,000 Units Intravenous Q T,Th,Sa-HD  . feeding supplement (NEPRO CARB STEADY)  237 mL Oral TID  . folic acid  2 mg Oral Daily  . gabapentin  200 mg Oral QHS  . mouth rinse  15 mL Mouth Rinse BID  . midodrine  10 mg Oral TID WC  . multivitamin  1 tablet Oral Daily  . pantoprazole  40 mg Oral BID AC  . piperacillin-tazobactam (ZOSYN)  IV  3.375 g Intravenous Q12H  .  prednisoLONE acetate  2 drop Both Eyes BID  . ranolazine  500 mg Oral BID  . senna  2 tablet Oral Daily  . sevelamer carbonate  2,400 mg Oral TID WC  . sodium chloride  500 mL Intravenous Once  . sodium chloride flush  3 mL Intravenous Q12H  . sodium chloride flush  3 mL Intravenous Q12H  . sodium hypochlorite  Irrigation BID  . vancomycin  1,000 mg Intravenous Q T,Th,Sa-HD    Assessment/Plan:  1. Lower GI bleed and hypotension. Repeat hemoglobin that was just drawn is stable. Recheck another couple hours since he had another episode of lower GI bleed. Watch closely in the CCU stepdown area patient is a full code. We'll order a nuclear medicine bleeding scan. Depending on blood pressure may end up needing pressors. I spoke with the daughter about transfusion of blood and she was okay with transfusion if needed. Benefits and risks explained to the daughter over the phone. Aspirin, Plavix and heparin subcutaneous discontinued. 2. Clinical sepsis with infected sacral decubiti, leukocytosis and hypotension. Surgery will do a debridement of this week sometime. Continue IV vancomycin and Zosyn at this point. 3. Hypotension. Increase midodrine. Hold Coreg. If blood pressure remains low may need pressors. Started off with a fluid bolus. 4. End-stage renal disease on dialysis. Dialysis as per nephrology. Currently holding with pus from the dialysis access. Vascular surgery to evaluate 5. History of congestive heart failure. No signs of heart failure at this time. 6. History of multiple myeloma 7. History of peripheral vascular disease 8. Hyperlipidemia unspecified on atorvastatin  9. Anemia of chronic disease on Procrit 10. Patient is a full code.  Daughter requested transfer to the [place]. I spoke with the care manager. Care manager states that the [place] is on diversion for dialysis patients.  Code Status:     Code Status Orders        Start     Ordered   12/10/15 0337  Full code  Continuous      12/10/15 0337    Code Status History    Date Active Date Inactive Code Status Order ID Comments User Context   11/28/2015  4:30 PM 12/02/2015  9:57 PM Full Code [id number]  Lytle Butte, MD ED     Family Communication: Daughter On the phone Disposition Plan: To be determined  Consultants:  Gen. Surgery  Nephrology  Vascular surgery  Antibiotics:  Vancomycin  Zosyn  Time spent: 35 minutes. Patient will be transferred to the CCU stepdown for closer monitoring. Spoke with nephrology, general surgery, radiology to set up bleeding scan.  Gregory Livingston  [employer]

## 2015-12-11 NOTE — Anesthesia Preprocedure Evaluation (Addendum)
Anesthesia Evaluation  Patient identified by MRN, date of birth, ID band Patient awake    Reviewed: Allergy & Precautions, H&P , NPO status , Patient's Chart, lab work & pertinent test results, reviewed documented beta blocker date and time   Airway Mallampati: III   Neck ROM: full    Dental  (+) Poor Dentition   Pulmonary neg pulmonary ROS, former smoker,    Pulmonary exam normal breath sounds clear to auscultation       Cardiovascular hypertension, + Peripheral Vascular Disease and +CHF  negative cardio ROS Normal cardiovascular exam Rhythm:regular Rate:Normal     Neuro/Psych negative neurological ROS  negative psych ROS   GI/Hepatic negative GI ROS, Neg liver ROS, PUD,   Endo/Other  negative endocrine ROSdiabetes  Renal/GU negative Renal ROS  negative genitourinary   Musculoskeletal   Abdominal   Peds  Hematology negative hematology ROS (+)   Anesthesia Other Findings Past Medical History: No date: AAA (abdominal aortic aneurysm) (H[facility]) No date: CHF (congestive heart failure) (HCC) No date: Diabetes mellitus without complication ([facility]) No date: Dialysis patient ([place]) No date: Hyperparathyroidism ([place]) No date: Hypertension No date: Multiple myeloma ([place]) No date: Peptic ulcer with perforation ([facility]) No date: PVD (peripheral vascular disease) ([place]) Past Surgical History: No date: AV FISTULA PLACEMENT No date: BELOW KNEE LEG AMPUTATION Bilateral No date: bilateral below knee amputation No date: PACEMAKER PLACEMENT BMI    Body Mass Index:  40.43 kg/m     Reproductive/Obstetrics negative OB ROS                             Anesthesia Physical Anesthesia Plan  ASA: IV and emergent  Anesthesia Plan: General   Post-op Pain Management:    Induction:   Airway Management Planned:   Additional Equipment:   Intra-op Plan:   Post-operative Plan:   Informed Consent: I have  reviewed the patients History and Physical, chart, labs and discussed the procedure including the risks, benefits and alternatives for the proposed anesthesia with the patient or authorized representative who has indicated his/her understanding and acceptance.   Dental Advisory Given  Plan Discussed with: CRNA  Anesthesia Plan Comments: (Plan to assist GI with this very sick patient.  Will proceed with spont vent GA in an effort to minimize HD stress. JA)       Anesthesia Quick Evaluation

## 2015-12-11 NOTE — Progress Notes (Signed)
CC: Sacral decubitus ulcer Subjective: 70 year old male with a likely stage IV sacral decubitus ulcer. Patient without any complaints of that area but does complain of pain all over. Patient appears to have had a bloody bowel movement in the bed today.  Objective: Vital signs in last 24 hours: Temp:  [97.6 F (36.4 C)-98.6 F (37 C)] 98.2 F (36.8 C) (12/10 0931) Pulse Rate:  [62-68] 62 (12/10 0931) Resp:  [16-20] 16 (12/10 0931) BP: (94-109)/(39-62) 94/59 (12/10 0931) SpO2:  [93 %-96 %] 96 % (12/10 0931) Weight:  [97.1 kg (214 lb)] 97.1 kg (214 lb) (12/09 1318) Last BM Date: 12/21/2015  Intake/Output from previous day: 12/09 0701 - 12/10 0700 In: 470 [P.O.:170; IV Piggyback:300] Out: 0  Intake/Output this shift: No intake/output data recorded.  Physical exam:  Gen.: No acute distress Chest: Clear to auscultation Heart: Regular rhythm Abdomen: Soft and nontender Skin: Proximally 15 cm sacral decubitus ulcer. Multiple areas of dry eschar as well as inflamed, infected-appearing tissue in the middle of the decubitus.  Lab Results: CBC   Recent Labs  12/17/2015 2315 12/10/15 0604  WBC 21.7* 23.5*  HGB 8.7* 8.9*  HCT 27.0* 27.5*  PLT 126* 124*   BMET  Recent Labs  12/22/2015 2315 12/10/15 0604  NA 131* 132*  K 3.5 3.5  CL 98* 99*  CO2 26 26  GLUCOSE 166* 152*  BUN 28* 31*  CREATININE 3.08* 3.25*  CALCIUM 9.1 9.1   PT/INR No results for input(s): LABPROT, INR in the last 72 hours. ABG No results for input(s): PHART, HCO3 in the last 72 hours.  Invalid input(s): PCO2, PO2  Studies/Results: No results found.  Anti-infectives: Anti-infectives    Start     Dose/Rate Route Frequency Ordered Stop   12/12/15 1200  vancomycin (VANCOCIN) IVPB 1000 mg/200 mL premix  Status:  Discontinued     1,000 mg 200 mL/hr over 60 Minutes Intravenous Every M-W-F (Hemodialysis) 12/10/15 0439 12/10/15 1207   12/10/15 1400  vancomycin (VANCOCIN) IVPB 1000 mg/200 mL premix   Status:  Discontinued     1,000 mg 200 mL/hr over 60 Minutes Intravenous  Once 12/10/15 1209 12/10/15 1217   12/10/15 1230  vancomycin (VANCOCIN) IVPB 1000 mg/200 mL premix     1,000 mg 200 mL/hr over 60 Minutes Intravenous Every T-Th-Sa (Hemodialysis) 12/10/15 1217     12/10/15 1000  piperacillin-tazobactam (ZOSYN) IVPB 3.375 g     3.375 g 12.5 mL/hr over 240 Minutes Intravenous Every 12 hours 12/10/15 0439     12/10/15 0445  vancomycin (VANCOCIN) 500 mg in sodium chloride 0.9 % 100 mL IVPB     500 mg 100 mL/hr over 60 Minutes Intravenous  Once 12/10/15 0439 12/10/15 0627   12/10/15 0445  piperacillin-tazobactam (ZOSYN) IVPB 3.375 g  Status:  Discontinued     3.375 g 100 mL/hr over 30 Minutes Intravenous  Once 12/10/15 0439 12/10/15 0447   12/10/15 0445  vancomycin (VANCOCIN) IVPB 1000 mg/200 mL premix  Status:  Discontinued     1,000 mg 200 mL/hr over 60 Minutes Intravenous  Once 12/10/15 0439 12/10/15 0447   12/10/15 0015  piperacillin-tazobactam (ZOSYN) IVPB 3.375 g     3.375 g 100 mL/hr over 30 Minutes Intravenous  Once 12/10/15 0001 12/10/15 0114   12/10/15 0015  vancomycin (VANCOCIN) IVPB 1000 mg/200 mL premix     1,000 mg 200 mL/hr over 60 Minutes Intravenous  Once 12/10/15 0001 12/10/15 0221      Assessment/Plan:  70 year old male with a  foul-smelling sacral decubitus ulcer. There are some areas of dry eschar and some areas of obvious infection today. Plan to add Dakin solution for twice daily dressing changes. Surgery will continue to follow with you as he will likely require an operative debridement in the near future.  Ivyanna Sibert T. Adonis Huguenin, MD, FACS  12/14/2015

## 2015-12-11 NOTE — Transfer of Care (Signed)
Immediate Anesthesia Transfer of Care Note  Patient: Gregory Livingston  Procedure(s) Performed: Procedure(s): ESOPHAGOGASTRODUODENOSCOPY (EGD) WITH PROPOFOL (N/A)  Patient Location:  ICU Anesthesia Type:General  Level of Consciousness: sedated and unresponsive  Airway & Oxygen Therapy: Patient remains intubated per anesthesia plan  Post-op Assessment: Report given to RN and Post -op Vital signs reviewed and unstable, Anesthesiologist notified  Post vital signs: Reviewed  Last Vitals:  Vitals:   12/28/2015 1746 12/14/2015 1800  BP: (!) 110/59   Pulse: 82   Resp: 15   Temp:  37 C    Last Pain:  Vitals:   12/08/2015 1800  TempSrc: Axillary  PainSc:          Complications: No apparent anesthesia complications

## 2015-12-11 NOTE — Plan of Care (Signed)
Notified Emergency contact that pt had been transferred to icu

## 2015-12-11 NOTE — Procedures (Signed)
Central Venous Catheter Insertion Procedure Note Gregory Livingston [id number] Apr 03, 1945  Procedure: Insertion of Central Venous Catheter Indications: Assessment of intravascular volume, Drug and/or fluid administration and Frequent blood sampling  Procedure Details Consent: Risks of procedure as well as the alternatives and risks of each were explained to the (patient/caregiver).  Consent for procedure obtained. and Unable to obtain consent because of emergent medical necessity. Time Out: Verified patient identification, verified procedure, site/side was marked, verified correct patient position, special equipment/implants available, medications/allergies/relevent history reviewed, required imaging and test results available.  Performed  Maximum sterile technique was used including antiseptics, cap, gloves, gown, hand hygiene, mask and sheet. Skin prep: Chlorhexidine; local anesthetic administered Using ultrasound guidance, the right internal jugular and left femoral veins were accessed with good blood return. However, the guide wire could not be threaded through the needle. Procedure aborted after three unsuccessful attempts to thread guidewire through  Evaluation Blood flow good Complications: possible vessel stenosis Patient did tolerate procedure well.  Magdalene S. 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  Unsuccessful attempt of obtaining a central access.  Vilinda Boehringer, MD [place] [facility] Pager [phone] (please enter 7-digits) On Call Pager - [phone] (please enter 7-digits)

## 2015-12-11 NOTE — Progress Notes (Signed)
Addendum  Code blue called. GI bleed and now hemodynamically unstable. Called in to start CRRT.  Will start CVVHD no UF. Therapy rate 2000 DFR 350 Awaiting labs.   Lavonia Dana

## 2015-12-11 NOTE — Progress Notes (Signed)
Pharmacy Antibiotic Note  Gregory Livingston is a 70 y.o. male admitted on 12/18/2015 with sepsis.  Pharmacy has been consulted for Zosyn and vancomycin dosing.  Plan: 1. Zosyn 3.375 gm IV Q12H EI 2. Vancomycin 1 gm IV x 1 in ED, will give additional 500 mg IV x 1 for total load of 1.5 gm, then vancomycin 1 gm IV Q-dialysis MWF. Will check random vanc level 12/15 at 0600.    12/10-  Pt starting CRRT-  Will adjust Vancomycin to 1 gram IV q24h. Will adjust to EI Zosyn to 3.375gm IV q8h.     Height: 5\' 1"  (154.9 cm) Weight: 214 lb (97.1 kg) IBW/kg (Calculated) : 52.3  Temp (24hrs), Avg:98.1 F (36.7 C), Min:97.3 F (36.3 C), Max:98.6 F (37 C)   Recent Labs Lab 12/20/2015 2315 12/10/15 0044 12/10/15 0604 12/06/2015 1852  WBC 21.7*  --  23.5* 45.1*  CREATININE 3.08*  --  3.25*  --   LATICACIDVEN  --  1.9  --   --     Estimated Creatinine Clearance: 20.4 mL/min (by C-G formula based on SCr of 3.25 mg/dL (H)).    Allergies  Allergen Reactions  . Ivp Dye [Iodinated Diagnostic Agents]     Thank you for allowing pharmacy to be a part of this patient's care.  Ladaisha Portillo A, Pharm.D., BCPS Clinical Pharmacist 12/10/2015 9:35 PM

## 2015-12-11 NOTE — ED Provider Notes (Signed)
-----------------------------------------   7:27 PM on 12/28/2015 -----------------------------------------  Called to ICU for CODE BLUE. At that time, there was telemetry ICU attending. I assisted in the code. They were doing CPR. Patient apparently had recently lost pulses. When we rechecked pulses, patient did have return of spontaneous circulation. This is after apparently one dose of an Afrin. At that time, as the patient was already intubated had pulses in a perfusing pressure, the ICU attending stated they would take it from there.   Schuyler Amor, MD 12/28/2015 [phone]

## 2015-12-11 NOTE — Anesthesia Procedure Notes (Signed)
Procedure Name: Intubation Date/Time: 12/13/2015 6:41 PM Performed by: Johnna Acosta Pre-anesthesia Checklist: Patient identified, Emergency Drugs available, Suction available, Patient being monitored and Timeout performed Patient Re-evaluated:Patient Re-evaluated prior to inductionOxygen Delivery Method: Ambu bag Preoxygenation: Pre-oxygenation with 100% oxygen Intubation Type: IV induction Ventilation: Mask ventilation without difficulty Laryngoscope Size: Mac and 4 Grade View: Grade II Tube type: Oral Tube size: 7.5 mm Number of attempts: 1 Airway Equipment and Method: Stylet Placement Confirmation: ETT inserted through vocal cords under direct vision,  CO2 detector and breath sounds checked- equal and bilateral Secured at: 22 cm Tube secured with: Tape Dental Injury: Teeth and Oropharynx as per pre-operative assessment

## 2015-12-11 NOTE — Plan of Care (Addendum)
Notified Daughter to let her know that the pt was intubated and had to be resuscitated. I redirected any further questions to the Md.   Julianne Handler did not take the change of status well

## 2015-12-11 NOTE — Progress Notes (Addendum)
When performing wound care on pt, he was turned on his side and multiple bright red, large blood clots were noted to be found under pt. While cleaning and dressing the patient's wound, multiple bright red, large clots were passed from pt's rectum. BP 99/55 and HR 66. Dr. Leslye Peer was called to the room as he was on the floor and he visualized the passed clots. The decision to transfer pt to stepdown was made. All bedside belongings packed and pt transferred to [place]. Chrys Racer, RN was given bedside report. Pt remained alert and oriented to self (the same as AM assessment) throughout ordeal. Pt denies any abdominal pain, CP, or SOB. Dr. Leslye Peer attempting to contact daughter over the phone.

## 2015-12-11 NOTE — Care Management Note (Addendum)
Case Management Note  Patient Details  Name: Gregory Livingston MRN: [id number] Date of Birth: 08-Jun-1945  Subjective/Objective:      This Probation officer called the [employer] about a possible transfer of [name] to the [place]. Per Jeneen Rinks, the AOD today, the [facility] is still on diversion for inpatient hemodialysis patients. Dr Leslye Peer was updated.  [name] has multiple chronic medical issues along with Bilateral BKA,. 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 Receives outpatient hemodialysis at the [facility] on T-T-S. [name] was admitted to [facility] from [facility]. Case management will follow for discharge planning to the [place], [place] will follow for discharge planning back to [facility].               Action/Plan:   Expected Discharge Date:                  Expected Discharge Plan:     In-House Referral:     Discharge planning Services     Post Acute Care Choice:    Choice offered to:     DME Arranged:    DME Agency:     HH Arranged:    HH Agency:     Status of Service:     If discussed at [employer] of Stay Meetings, dates discussed:    Additional Comments:  Ambre Kobayashi A, RN 12/14/2015, 1:58 PM

## 2015-12-11 NOTE — Consult Note (Signed)
Referring Provider: Dr. Leslye Peer Primary Care Physician:  [facility] Primary Gastroenterologist: Althia Forts  Reason for Consultation:  GI bleed  HPI: Gregory Livingston is a 70 y.o. male with multiple medical problems as stated below who had a perforated ulcer last month with ex lap done at the [facility] (records not available). He had a bleeding antral gastric ulcer treated with hemoclips X 4 with hemostasis achieved. Examined duodenum was normal. He was admitted for sepsis with dehydration and leucocytosis. He was on Aspirin and Plavix prior to admit. He started having profuse bright red blood per rectum with clots and hypotension into the 57'B systolic. He is unable to give me any history. He is on hemodialysis. Spoke with daughter by phone.   Past Medical History:  Diagnosis Date  . AAA (abdominal aortic aneurysm) ([place])   . CHF (congestive heart failure) ([place])   . Diabetes mellitus without complication ([place])   . Dialysis patient ([place])   . Hyperparathyroidism ([place])   . Hypertension   . Multiple myeloma ([place])   . Peptic ulcer with perforation ([place])   . PVD (peripheral vascular disease) ([place])     Past Surgical History:  Procedure Laterality Date  . AV FISTULA PLACEMENT    . BELOW KNEE LEG AMPUTATION Bilateral   . bilateral below knee amputation    . PACEMAKER PLACEMENT      Prior to Admission medications   Medication Sig Start Date End Date Taking? Authorizing Provider  acetaminophen (TYLENOL) 325 MG tablet Take 975 mg by mouth every 8 (eight) hours as needed.   Yes Historical Provider, MD  albuterol (PROVENTIL HFA;VENTOLIN HFA) 108 (90 Base) MCG/ACT inhaler Inhale 2 puffs into the lungs every 6 (six) hours as needed for shortness of breath.   Yes Historical Provider, MD  atorvastatin (LIPITOR) 40 MG tablet Take 40 mg by mouth at bedtime.   Yes Historical Provider, MD  B Complex-C-Folic Acid (RENAL VITAMIN PO) Take 1 tablet by mouth daily.   Yes Historical Provider, MD   bacitracin 500 UNIT/GM ointment Apply 1 application topically daily. Apply small amount topically to left knee abrasion daily   Yes Historical Provider, MD  brimonidine (ALPHAGAN) 0.2 % ophthalmic solution Place 1 drop into the left eye 3 (three) times daily.   Yes Historical Provider, MD  calcium-vitamin D (OSCAL WITH D) 500-200 MG-UNIT tablet Take 1 tablet by mouth 2 (two) times daily.   Yes Historical Provider, MD  carvedilol (COREG) 25 MG tablet Take 12.5 mg by mouth every 12 (twelve) hours.    Yes Historical Provider, MD  cinacalcet (SENSIPAR) 60 MG tablet Take 120 mg by mouth daily. Take 120 mg every evening with food.   Yes Historical Provider, MD  Difluprednate (DUREZOL) 0.05 % EMUL Place 1 drop into both eyes daily.   Yes Historical Provider, MD  doxycycline (VIBRAMYCIN) 100 MG capsule Take 100 mg by mouth 2 (two) times daily.   Yes Historical Provider, MD  folic acid (FOLVITE) 1 MG tablet Take 2 mg by mouth daily.   Yes Historical Provider, MD  gabapentin (NEURONTIN) 100 MG capsule Take 200 mg by mouth at bedtime.   Yes Historical Provider, MD  hydroxypropyl methylcellulose / hypromellose (ISOPTO TEARS / GONIOVISC) 2.5 % ophthalmic solution Place 1 drop into both eyes 4 (four) times daily as needed for dry eyes.   Yes Historical Provider, MD  isosorbide mononitrate (IMDUR) 120 MG 24 hr tablet Take 1 tablet (120 mg total) by mouth daily. Patient taking differently:  Take 240 mg by mouth daily.  12/02/15  Yes Vipul Manuella Ghazi, MD  nitroGLYCERIN (NITROSTAT) 0.4 MG SL tablet Place 0.4 mg under the tongue every 5 (five) minutes as needed for chest pain.   Yes Historical Provider, MD  Nutritional Supplements (FEEDING SUPPLEMENT, NEPRO CARB STEADY,) LIQD Take 237 mLs by mouth 3 (three) times daily.   Yes Historical Provider, MD  oxyCODONE (OXY IR/ROXICODONE) 5 MG immediate release tablet Take 5 mg by mouth every 4 (four) hours as needed for severe pain.   Yes Historical Provider, MD  pantoprazole  (PROTONIX) 40 MG tablet Take 40 mg by mouth 2 (two) times daily.   Yes Historical Provider, MD  ranolazine (RANEXA) 500 MG 12 hr tablet Take 500 mg by mouth 2 (two) times daily.   Yes Historical Provider, MD  senna (SENOKOT) 8.6 MG TABS tablet Take 2 tablets by mouth daily.   Yes Historical Provider, MD  sevelamer carbonate (RENVELA) 800 MG tablet Take 2,400 mg by mouth 3 (three) times daily with meals.   Yes Historical Provider, MD  aspirin 81 MG chewable tablet Chew 81 mg by mouth daily.    Historical Provider, MD  clopidogrel (PLAVIX) 75 MG tablet Take 75 mg by mouth daily.    Historical Provider, MD  Heparin Sodium, Porcine, PF 5000 UNIT/0.5ML SOLN Inject 0.5 mLs as directed every 8 (eight) hours.    Historical Provider, MD    Scheduled Meds: . atorvastatin  40 mg Oral QHS  . bacitracin  1 application Topical Daily  . brimonidine  1 drop Left Eye TID  . calcium-vitamin D  1 tablet Oral BID  . cinacalcet  120 mg Oral Q breakfast  . epoetin (EPOGEN/PROCRIT) injection  10,000 Units Intravenous Q T,Th,Sa-HD  . feeding supplement (NEPRO CARB STEADY)  237 mL Oral TID  . folic acid  2 mg Oral Daily  . gabapentin  200 mg Oral QHS  . mouth rinse  15 mL Mouth Rinse BID  . midodrine  10 mg Oral TID WC  . multivitamin  1 tablet Oral Daily  . pantoprazole (PROTONIX) IVPB  80 mg Intravenous Once  . [START ON 01/04/2016] pantoprazole  40 mg Intravenous Q12H  . piperacillin-tazobactam (ZOSYN)  IV  3.375 g Intravenous Q12H  . prednisoLONE acetate  2 drop Both Eyes BID  . ranolazine  500 mg Oral BID  . senna  2 tablet Oral Daily  . sevelamer carbonate  2,400 mg Oral TID WC  . sodium chloride  500 mL Intravenous Once  . sodium chloride flush  3 mL Intravenous Q12H  . sodium chloride flush  3 mL Intravenous Q12H  . sodium hypochlorite   Irrigation BID  . vancomycin  1,000 mg Intravenous Q T,Th,Sa-HD   Continuous Infusions: . norepinephrine (LEVOPHED) Adult infusion 12 mcg/min (12/08/2015 1545)  .  pantoprozole (PROTONIX) infusion     PRN Meds:.sodium chloride, acetaminophen **OR** acetaminophen, albuterol, nitroGLYCERIN, ondansetron **OR** ondansetron (ZOFRAN) IV, oxyCODONE, polyvinyl alcohol, sodium chloride flush  Allergies as of 12/20/2015 - Review Complete 12/08/2015  Allergen Reaction Noted  . Ivp dye [iodinated diagnostic agents]  11/27/2015    Family History  Problem Relation Age of Onset  . Hypertension Other     Social History   Social History  . Marital status: Unknown    Spouse name: N/A  . Number of children: N/A  . Years of education: N/A   Occupational History  . retired    Social History Main Topics  . Smoking status: Former  Smoker  . Smokeless tobacco: Never Used  . Alcohol use Yes  . Drug use: No  . Sexual activity: Not on file   Other Topics Concern  . Not on file   Social History Narrative  . No narrative on file    Review of Systems: All negative except as stated above in HPI.  Physical Exam: Vital signs: Vitals:   12/06/2015 1610 12/18/2015 1620  BP: (!) 129/53   Pulse: 68   Resp: 17   Temp:  98.2 F (36.8 C)   Last BM Date: 12/04/2015 General:  Lethargic, demented, elderly, frail, +acute distress HEENT: anicteric sclera Lungs:  Clear throughout to auscultation anteriorly.   No wheezes, crackles, or rhonchi. No acute distress. Heart:  Regular rate and rhythm; no murmurs, clicks, rubs,  or gallops. Abdomen: epigastric tenderness with guarding, soft, nondistended, +BS  Rectal:  Deferred Ext: bilateral BKA  GI:  Lab Results:  Recent Labs  12/29/2015 2315 12/10/15 0604 12/20/2015 1219 12/05/2015 1434  WBC 21.7* 23.5*  --   --   HGB 8.7* 8.9* 8.8* 8.2*  HCT 27.0* 27.5*  --   --   PLT 126* 124*  --   --    BMET  Recent Labs  01/01/2016 2315 12/10/15 0604  NA 131* 132*  K 3.5 3.5  CL 98* 99*  CO2 26 26  GLUCOSE 166* 152*  BUN 28* 31*  CREATININE 3.08* 3.25*  CALCIUM 9.1 9.1   LFT No results for input(s): PROT, ALBUMIN,  AST, ALT, ALKPHOS, BILITOT, BILIDIR, IBILI in the last 72 hours. PT/INR No results for input(s): LABPROT, INR in the last 72 hours.   Studies/Results: No results found.  Impression/Plan: 70 yo with BRBPR and hypotension concerning for a peptic ulcer bleed. S/P surgery for a perforated ulcer last month at the [place]. Protonix drip. Stat CXR to look for free air. If free air, then will need surgery. If negative for free air, then EGD today to further evaluate for a source of the bleeding. Blood transfusions. Pressors. Discussed risks/benefits of the EGD with the patient's daughter by phone who gave consent for the EGD.     LOS: 1 day   Montara C.  12/19/2015, 4:28 PM

## 2015-12-11 NOTE — Significant Event (Signed)
ENDOSCOPY at bedside    Pulse was faint and was heard using a doppler @0632  PM 1 amp epinephrine was administered  98/47 map 59 Levo @40mcg  @0632pm   Bagged the pt @0635pm   Intubated @0636pm   169/69 Levo @35mcg  @0637pm   183/68 map 94     levo @30mcg  @0638pm   Cbc drawn and abg and sent for processing 0643pm  Code blue 0650pm  See code blue note

## 2015-12-11 NOTE — Progress Notes (Signed)
2205: After Ms. Gregory Mane, NP conferred w/ Dr. Stevenson Clinch new CVC line placement procedure started in L femoral. Pt. Heart rate up top 160's not sustained- turned epi gtt off, 2 pressors still running, see MAR for details. 2250: unable to place line. VSS. Will continue to monitor pt. Closely.

## 2015-12-11 NOTE — Consult Note (Addendum)
PULMONARY / CRITICAL CARE MEDICINE   Name: Gregory Livingston MRN: [id number] DOB: 26-Feb-1945    ADMISSION DATE:  12/07/2015   CONSULTATION DATE: 12/20/2015  REFERRING MD: Dr. Posey Pronto   CHIEF COMPLAINT:  Cardiac arrest  HISTORY OF PRESENT ILLNESS:   This is a 70 y/o AA male, SNF resident with a significant medical history who presented to the ED via EMS with hypotension. EMS was called because SNF staff were concerned that patient is septic. He was found to have a significantly elevated WBC hence he was admitted and treatment for sepsis was initiated. Possible source was thought to be his sacral decubitus ulcer. 12/08/2015 patient was noted to have a large blood stools that evolved to just bright red blood with multiple clots from the rectum. He was transfused 3 units of packed red blood cells and a bedside upper GI endoscopy was performed. Postprocedure, patient became hypoxic, hypotensive and subsequently went into cardiac arrest. He was resuscitated for approximately 10 minutes with return of spontaneous circulation. PCCM was consulted for further management.  His EGD showed severe ulcerative colitis with a massive the stock clean-based gastric ulcer. Rectal bleeding has improved. The patient continues to be severely hypotensive requiring multiple pressors and IV fluids. He has end-stage renal disease and is due for dialysis. However, given his low blood pressure, Nephrology has decided to proceed with continuous renal replacement therapy  PAST MEDICAL HISTORY :  He  has a past medical history of AAA (abdominal aortic aneurysm) ([place]); CHF (congestive heart failure) ([place]); Diabetes mellitus without complication ([place]); Dialysis patient [facility]); Hyperparathyroidism ([place]); Hypertension; Multiple myeloma ([place]); Peptic ulcer with perforation ([place]); and PVD (peripheral vascular disease) ([place]).  PAST SURGICAL HISTORY: He  has a past surgical history that includes Below knee leg amputation (Bilateral); pacemaker  placement; AV fistula placement; and bilateral below knee amputation.  Allergies  Allergen Reactions  . Ivp Dye [Iodinated Diagnostic Agents]     No current facility-administered medications on file prior to encounter.    Current Outpatient Prescriptions on File Prior to Encounter  Medication Sig  . acetaminophen (TYLENOL) 325 MG tablet Take 975 mg by mouth every 8 (eight) hours as needed.  Marland Kitchen albuterol (PROVENTIL HFA;VENTOLIN HFA) 108 (90 Base) MCG/ACT inhaler Inhale 2 puffs into the lungs every 6 (six) hours as needed for shortness of breath.  Marland Kitchen atorvastatin (LIPITOR) 40 MG tablet Take 40 mg by mouth at bedtime.  . B Complex-C-Folic Acid (RENAL VITAMIN PO) Take 1 tablet by mouth daily.  . bacitracin 500 UNIT/GM ointment Apply 1 application topically daily. Apply small amount topically to left knee abrasion daily  . brimonidine (ALPHAGAN) 0.2 % ophthalmic solution Place 1 drop into the left eye 3 (three) times daily.  . calcium-vitamin D (OSCAL WITH D) 500-200 MG-UNIT tablet Take 1 tablet by mouth 2 (two) times daily.  . carvedilol (COREG) 25 MG tablet Take 12.5 mg by mouth every 12 (twelve) hours.   . cinacalcet (SENSIPAR) 60 MG tablet Take 120 mg by mouth daily. Take 120 mg every evening with food.  . Difluprednate (DUREZOL) 0.05 % EMUL Place 1 drop into both eyes daily.  . folic acid (FOLVITE) 1 MG tablet Take 2 mg by mouth daily.  Marland Kitchen gabapentin (NEURONTIN) 100 MG capsule Take 200 mg by mouth at bedtime.  . hydroxypropyl methylcellulose / hypromellose (ISOPTO TEARS / GONIOVISC) 2.5 % ophthalmic solution Place 1 drop into both eyes 4 (four) times daily as needed for dry eyes.  . isosorbide mononitrate (IMDUR) 120 MG  24 hr tablet Take 1 tablet (120 mg total) by mouth daily. (Patient taking differently: Take 240 mg by mouth daily. )  . nitroGLYCERIN (NITROSTAT) 0.4 MG SL tablet Place 0.4 mg under the tongue every 5 (five) minutes as needed for chest pain.  . Nutritional Supplements (FEEDING  SUPPLEMENT, NEPRO CARB STEADY,) LIQD Take 237 mLs by mouth 3 (three) times daily.  Marland Kitchen oxyCODONE (OXY IR/ROXICODONE) 5 MG immediate release tablet Take 5 mg by mouth every 4 (four) hours as needed for severe pain.  . pantoprazole (PROTONIX) 40 MG tablet Take 40 mg by mouth 2 (two) times daily.  . ranolazine (RANEXA) 500 MG 12 hr tablet Take 500 mg by mouth 2 (two) times daily.  Marland Kitchen senna (SENOKOT) 8.6 MG TABS tablet Take 2 tablets by mouth daily.  . sevelamer carbonate (RENVELA) 800 MG tablet Take 2,400 mg by mouth 3 (three) times daily with meals.  Marland Kitchen aspirin 81 MG chewable tablet Chew 81 mg by mouth daily.  . clopidogrel (PLAVIX) 75 MG tablet Take 75 mg by mouth daily.  . Heparin Sodium, Porcine, PF 5000 UNIT/0.5ML SOLN Inject 0.5 mLs as directed every 8 (eight) hours.    FAMILY HISTORY:  His indicated that his mother is deceased. He indicated that his father is deceased. He indicated that the status of his other is unknown.    SOCIAL HISTORY: He  reports that he has quit smoking. He has never used smokeless tobacco. He reports that he drinks alcohol. He reports that he does not use drugs.  REVIEW OF SYSTEMS:   Unable to obtain as patient is currently intubated and sedated  SUBJECTIVE:   VITAL SIGNS: BP 119/71   Pulse 71   Temp 97.7 F (36.5 C) (Axillary)   Resp (!) 0   Ht 5' 1"  (1.549 m)   Wt 97.1 kg (214 lb 1.1 oz)   SpO2 100%   BMI 40.45 kg/m   HEMODYNAMICS: CVP:  [1 mmHg-24 mmHg] 9 mmHg  VENTILATOR SETTINGS: Vent Mode: PRVC FiO2 (%):  [50 %-100 %] 50 % Set Rate:  [16 bmp] 16 bmp Vt Set:  [420 mL] 420 mL PEEP:  [5 cmH20] 5 cmH20 Plateau Pressure:  [19 cmH20] 19 cmH20  INTAKE / OUTPUT: I/O last 3 completed shifts: In: 1586.6 [P.O.:170; I.V.:196.3; Blood:870.3; IV Piggyback:350] Out: 0   PHYSICAL EXAMINATION: General: Acutely ill-looking, older for age Neuro: Sedated, moving upper or lower extremities, but not purposefully, pupils are dilated and sluggish, positive  corneal reflex HEENT: Head is normocephalic and atraumatic, ET tube, oral cavity with copious secretions, trachea midline  Cardiovascular: Rate and rhythm regular without runs of ventricular tachycardia, S1, S2, no murmur reculture gallop, palpation of left chest wall reveals embedded pacemaker Lungs: Breath sounds bilaterally in all lung fields, rhonchi in anterior lung fields, no wheezing Abdomen: Nondistended, normal bowel sounds, palpation reveals no organomegaly Musculoskeletal: Bilateral BKA, upper extremities with normal range of motion Extremities: +2 pulses in upper extremities,right upper arm fistula with a positive bruits Skin: Large sacral decubitus ulcer with serous purulent drainage and sloughing, no rash or other lesions noted  LABS:  BMET  Recent Labs Lab 12/03/2015 2315 12/10/15 0604 12/24/2015 2055  NA 131* 132* 135  K 3.5 3.5 5.0  CL 98* 99* 97*  CO2 26 26 26   BUN 28* 31* 41*  CREATININE 3.08* 3.25* 3.85*  GLUCOSE 166* 152* 261*    Electrolytes  Recent Labs Lab 12/08/2015 2315 12/10/15 0604 12/27/2015 2055  CALCIUM 9.1 9.1 8.4*  MG  --   --  1.7  PHOS  --   --  3.6    CBC  Recent Labs Lab 12/26/2015 1852 12/03/2015 2055 12/12/15 0447  WBC 45.1* 44.0* 53.0*  HGB 10.1* 9.8* 9.9*  HCT 31.3* 29.6* 31.5*  PLT 122* 134* 140*    Coag's  Recent Labs Lab 12/13/2015 1613 12/16/2015 2055  APTT 46* 88*  INR 1.15 2.87    Sepsis Markers  Recent Labs Lab 12/10/15 0044 12/21/2015 1532 12/21/2015 2055 12/12/15 0447  LATICACIDVEN 1.9  --  4.7* 6.9*  PROCALCITON  --  1.65  --   --     ABG  Recent Labs Lab 12/08/2015 1845 12/08/2015 2000  PHART 7.34* 7.32*  PCO2ART 26* 32  PO2ART 317* 279*    Liver Enzymes  Recent Labs Lab 12/04/2015 2055  ALBUMIN 1.4*    Cardiac Enzymes No results for input(s): TROPONINI, PROBNP in the last 168 hours.  Glucose  Recent Labs Lab 12/02/2015 1347 12/13/2015 1938 12/05/2015 2324 12/12/15 0328  GLUCAP 135* 95 217*  116*    Imaging Dg Chest Port 1 View  Result Date: 12/24/2015 CLINICAL DATA:  Endotracheal tube placement EXAM: PORTABLE CHEST 1 VIEW COMPARISON:  1656 hours on the same day FINDINGS: Slightly low-lying endotracheal tube tip approximately 2.3 cm above the carina. Pullback approximately 1 cm. Endotracheal cuff appears slightly over distended as well. Heart is borderline enlarged. There is atelectasis at left lung base. External defibrillator device projects over the heart and mediastinum. There is aortic atherosclerosis at the arch. No suspicious osseous abnormality. Cardiac pacing device projects over the left axilla with right atrial and right ventricular leads in place. IMPRESSION: Low-lying endotracheal tube tip position seen 2.3 cm above the carina with slightly over distended endotracheal tube cuff. Pullback approximately 1 cm. No change in cardiomegaly. Aortic atherosclerosis. Electronically Signed   By: Ashley Royalty M.D.   On: 12/30/2015 19:32   Dg Abd Acute W/chest  Result Date: 12/29/2015 CLINICAL DATA:  GI bleed.  Abdominal distension. EXAM: DG ABDOMEN ACUTE W/ 1V CHEST COMPARISON:  Chest radiograph 12/04/2015. CT abdomen and pelvis 11/28/2015. 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 Dual lead pacemaker remains in place. The cardiac silhouette remains mildly enlarged. Aortic atherosclerosis is again seen. The lungs are slightly less well inflated than on the prior study, and there is minimal left basilar atelectasis. No sizable pleural effusion or pneumothorax is identified. A right axillary stent is again seen. There is no evidence of intraperitoneal free air. No dilated loops of bowel are seen to suggest obstruction. A small to moderate amount of stool is present in the proximal colon, with only a small amount of stool more distally. A 5.6 cm abdominal aortic aneurysm is noted, more fully assessed on recent CT. Surgical clips are present in the upper abdomen and pelvis. Lumbar  spondylosis is noted. IMPRESSION: 1. Minimal left basilar atelectasis. 2. Nonobstructed bowel gas pattern. 3. Aortic atherosclerosis.  5.6 cm abdominal aortic aneurysm. Electronically Signed   By: Logan Bores M.D.   On: 12/05/2015 17:33    STUDIES:  2-D echo ordered. 12/10/2015  CULTURES: Blood cultures 2, 12/10/2015  ANTIBIOTICS: Vancomycin started 12/10/2015. Zosyn started 12/10/2015  SIGNIFICANT EVENTS: 12/26/2015: Admitted with sepsis due to sacral decubitus ulcer. 12/29/2015: Severe GI bleed, bedside endoscopy and cardiac arrests post procedure  LINES/TUBES: Right femoral central intravenous catheter placed 12/10/2015 Left femoral A-line placed, 12/10/2015 left femoral dialysis catheter placed 12/23/2015  DISCUSSION: This is a 70 year old African-American  male with end-stage renal disease on hemodialysis, severe peripheral vascular disease, status post bilateral BKA, peptic ulcer with perforation, multiple myeloma, type 2 diabetes, AAA, and congestive heart failure, presenting with acute GI bleed, septic/hypovolemic shock, sepsis from sacral decubitus ulcer, and severe metabolic acidosis with subsequent respiratory failure. Now in possible DIC with multiorgan failure. Overall prognosis is guarded given multiple comorbidities  ASSESSMENT / PLAN:  PULMONARY A: Acute respiratory failure Severe respiratory acidosis Lactic acidosis-lactic acid pending up from 4.7 to 6.9 P:   Continue full vent support with current settings Chest x-ray and ABG daily Nebulized bronchodilators VAP Protocol  CARDIOVASCULAR A:  Cardiac arrest , status posts upper GI endoscopy Shock-initially patient wasn't septic and hypovolemic shock, but there could be a component of cardiogenic shock as well History of congestive heart failure P:  Hemodynamic monitoring per ICU protocol Norepinephrine, epinephrine, and vasopressin infusions; titrate to map goal of greater than or equal 65 CVP monitoring  every 4 hours When necessary fluid boluses for CVP less than 8  RENAL A:   End-stage renal disease on hemodialysis P:   Nephrology following. Continuous renal replacement therapy per nephrology Monitor and correct electrolytes  GASTROINTESTINAL A:   Acute GI bleed, status posts. Upper GI endoscopy-rectal bleeding has subsided P:   GI following. Continue Protonix infusion Monitor hemoglobin and hematocrit and transfuse per protocol Monitor for further rectal bleeding  HEMATOLOGIC A:   Acute blood loss anemia Possible DIC P:  Trend CBC and transfuse when hemoglobin less than 7 with active bleeding Monitor PT INR and APTT  Monitor for further bleeding  INFECTIOUS A:   Severe sepsis from infected decubitus ulcer WBC trending up; now 15.3 Questionable intra-abdominal infection P:   Antibiotics as above. Monitor fever curve Follow-up blood and sputum cultures  ENDOCRINE A:   History of type 2 diabetes  P:   Patient is currently nothing by mouth Monitor blood glucose every 4 hours with sliding scale insulin coverage  NEUROLOGIC A:   Acute metabolic encephalopathy secondary to sepsis Doubt anoxic brain injury given that cardiac arrest was witnessed and CPR was initiated immediately P:   RASS goal: -1 to -2 Fentanyl and Versed for sedation. Lurline Idol continue to treat metabolic causes Continue full vent support. Monitor for changes in mental status   FAMILY  - Updates: No family at bedside. We'll updated when available  - Inter-disciplinary family meet or Palliative Care meeting due by:  day 7 Of care discussed with Dr. Stevenson Clinch covering attending and [facility] physician  Critical care time equals 120 minutes   Magdalene S. [facility] ANP-BC Pulmonary and [place] Pager [phone] or [phone] 12/12/2015, 6:46 AM   STAFF NOTE: I, Dr. Vilinda Boehringer have personally reviewed patient's available data, including medical history, events  of note, physical examination and test results as part of my evaluation. I have discussed with NP Patria Mane and other care providers such as pharmacist, RN and RRT.  In addition,  I personally evaluated patient and elicited key findings of   HPI:  70 y/o AA male, SNF resident with a significant medical history who presented to the ED via EMS with hypotension. EMS was called because SNF staff were concerned that patient is septic. He was found to have a significantly elevated WBC hence he was admitted and treatment for sepsis was initiated. Possible source was thought to be his sacral decubitus ulcer. 12/16/2015 patient was noted to have a large blood stools that evolved to just bright red blood  with multiple clots from the rectum. He was transfused 3 units of packed red blood cells and a bedside upper GI endoscopy was performed. Postprocedure, patient became hypoxic, hypotensive and subsequently went into cardiac arrest. He was resuscitated for approximately 10 minutes with return of spontaneous circulation. PCCM was consulted for further management. Overnight, patient had placement of a Vas-Cath, his pressors and now being weaned currently only on levo fed and vasopressin. His EGD showed severe ulcerative colitis with a massive the stock clean-based gastric ulcer. Rectal bleeding has improved. The patient continues to be severely hypotensive requiring multiple pressors and IV fluids. He has end-stage renal disease and is due for dialysis. However, given his low blood pressure, Nephrology has decided to proceed with continuous renal replacement therapy O:   GEN-on vent sedated, critically ill HEENT-mildly swollen face, no acute lesions CVS-no murmurs, RRR LUNGS-on vent, coarse upper airway sounds, dec basilar sounds ABD-soft, obese, +BS MSK-b/l amputations   Recent Labs CBC Latest Ref Rng & Units 12/12/2015 12/19/2015 12/26/2015  WBC 3.8 - 10.6 K/uL 53.0(HH) 44.0(H) 45.1(H)  Hemoglobin 13.0 - 18.0 g/dL  9.9(L) 9.8(L) 10.1(L)  Hematocrit 40.0 - 52.0 % 31.5(L) 29.6(L) 31.3(L)  Platelets 150 - 440 K/uL 140(L) 134(L) 122(L)      Recent Labs BMP Latest Ref Rng & Units 12/12/2015 12/05/2015 12/10/2015  Glucose 65 - 99 mg/dL 185(H) 261(H) 152(H)  BUN 6 - 20 mg/dL 38(H) 41(H) 31(H)  Creatinine 0.61 - 1.24 mg/dL 3.29(H) 3.85(H) 3.25(H)  Sodium 135 - 145 mmol/L 134(L) 135 132(L)  Potassium 3.5 - 5.1 mmol/L 5.3(H) 5.0 3.5  Chloride 101 - 111 mmol/L 102 97(L) 99(L)  CO2 22 - 32 mmol/L 23 26 26   Calcium 8.9 - 10.3 mg/dL 8.4(L) 8.4(L) 9.1       (The following images and results were reviewed by Dr. Stevenson Clinch on 12/12/2015). Portable Chest Xray  Result Date: 12/12/2015 CLINICAL DATA:  Respiratory failure. EXAM: PORTABLE CHEST 1 VIEW COMPARISON:  Cough 10/2015. FINDINGS: Endotracheal tube terminates 3.6 cm above the carina. Pacemaker lead tips are in the right atrium and right ventricle. Defibrillator pad overlies of the medial left chest. Heart size stable. Thoracic aorta is calcified. There may be minimal left perihilar airspace opacification. Difficult to exclude developing left lower lobe consolidation. No definite pleural fluid. No pleural fluid. IMPRESSION: 1. Left perihilar opacification may be due to atelectasis. Developing pneumonia is not excluded. 2. Difficult to exclude developing left lower lobe consolidation. 3.  Aortic atherosclerosis (ICD10-170.0). Electronically Signed   By: Lorin Picket M.D.   On: 12/12/2015 07:17   Dg Chest Port 1 View  Result Date: 12/08/2015 CLINICAL DATA:  Endotracheal tube placement EXAM: PORTABLE CHEST 1 VIEW COMPARISON:  1656 hours on the same day FINDINGS: Slightly low-lying endotracheal tube tip approximately 2.3 cm above the carina. Pullback approximately 1 cm. Endotracheal cuff appears slightly over distended as well. Heart is borderline enlarged. There is atelectasis at left lung base. External defibrillator device projects over the heart and mediastinum.  There is aortic atherosclerosis at the arch. No suspicious osseous abnormality. Cardiac pacing device projects over the left axilla with right atrial and right ventricular leads in place. IMPRESSION: Low-lying endotracheal tube tip position seen 2.3 cm above the carina with slightly over distended endotracheal tube cuff. Pullback approximately 1 cm. No change in cardiomegaly. Aortic atherosclerosis. Electronically Signed   By: Ashley Royalty M.D.   On: 12/20/2015 19:32   Dg Abd Acute W/chest  Result Date: 12/29/2015 CLINICAL DATA:  GI bleed.  Abdominal distension. EXAM: DG ABDOMEN ACUTE W/ 1V CHEST COMPARISON:  Chest radiograph 12/04/2015. CT abdomen and pelvis 11/28/2015. 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 Dual lead pacemaker remains in place. The cardiac silhouette remains mildly enlarged. Aortic atherosclerosis is again seen. The lungs are slightly less well inflated than on the prior study, and there is minimal left basilar atelectasis. No sizable pleural effusion or pneumothorax is identified. A right axillary stent is again seen. There is no evidence of intraperitoneal free air. No dilated loops of bowel are seen to suggest obstruction. A small to moderate amount of stool is present in the proximal colon, with only a small amount of stool more distally. A 5.6 cm abdominal aortic aneurysm is noted, more fully assessed on recent CT. Surgical clips are present in the upper abdomen and pelvis. Lumbar spondylosis is noted. IMPRESSION: 1. Minimal left basilar atelectasis. 2. Nonobstructed bowel gas pattern. 3. Aortic atherosclerosis.  5.6 cm abdominal aortic aneurysm. Electronically Signed   By: Logan Bores M.D.   On: 12/17/2015 17:9      A:70 year old African-American male with end-stage renal disease on hemodialysis, severe peripheral vascular disease, status post bilateral BKA, peptic ulcer with perforation, multiple myeloma, type 2 diabetes, AAA, and congestive heart failure,  presenting with acute GI bleed, septic/hypovolemic shock, sepsis from sacral decubitus ulcer, and severe metabolic acidosis with subsequent respiratory failure. Now in possible DIC with multiorgan failure. Overall prognosis is guarded given multiple comorbidities  Cardiac arrest GI bleed Hemorrhagic anemia-acute blood loss anemia Acute hypoxic respiratory failure Severe sore acidosis Lactic acidosis End-stage renal disease, acute on chronic Metabolic acidosis DIC Multiorgan system failure Sepsis-possibly from sacral decubitus history of type 2 diabetes Metabolic encephalopathy Sacral ulcer - unstageable  P:   Now status post cardiac arrest with return of spontaneous circulation, currently stable hemodynamics on 2 pressors which are being weaned and ventilatory support Continue with mechanical ventilation wean as tolerated Keep MAP>65 Monitor H/H Check fibrinogen and fibrin split products, is still elevated may need to give FFP or factor replacement Follow-up GI recommendations Continue with antibiotics as stated above ICU hypo/hyperglycemia protocol Surgery following along for Sacral ulcer- possible debridement in 1-2 days.  Possible Transfer to [facility].   Overall with poor prognosis given the significant GI bleed and findings from his EGD, multiple comorbidities also leading to poor prognosis.   Marland Kitchen  Rest per NP/medical resident whose note is outlined above and that I agree with  The patient is critically ill with multiple organ systems failure and requires high complexity decision making for assessment and support, frequent evaluation and titration of therapies, application of advanced monitoring technologies and extensive interpretation of multiple databases.   Critical Care Time devoted to patient care services described in this note is  17 Minutes.   This time reflects time of care of this signee Dr Vilinda Boehringer.  This critical care time does not reflect procedure time, or  teaching time or supervisory time of PA/NP/Med-student/Med Resident etc but could involve care discussion time.  Vilinda Boehringer, MD [place] [facility] and Critical [facility] Pager [phone] (please enter 7-digits) On Call Pager [phone] (please enter 7-digits)  Note: This note was prepared with Dragon dictation along with smaller phrase technology. Any transcriptional errors that result from this process are unintentional.

## 2015-12-11 NOTE — Plan of Care (Signed)
Problem: Education: Goal: Knowledge of [place] [facility] Education information/materials will improve Outcome: Progressing Possible I & D to sacral decub mid week pt sx MD, cont IV abx per md order.   Problem: Safety: Goal: Ability to remain free from injury will improve Outcome: Progressing Pt bed bound, pt on rotation bed.   Problem: Pain Managment: Goal: General experience of comfort will improve Outcome: Progressing Pt denies pain except with turns.   Problem: Physical Regulation: Goal: Ability to maintain clinical measurements within normal limits will improve Outcome: Not Progressing Passive ROM done. Goal: Will remain free from infection Outcome: Not Progressing Cont to monitor WBC, adm abx per MD order. Pt afebrile, cultures sent.   Problem: Skin Integrity: Goal: Risk for impaired skin integrity will decrease Outcome: Not Progressing Non healing decub.   Problem: Activity: Goal: Risk for activity intolerance will decrease Outcome: Not Progressing Bed bound, speciality bed in place.   Problem: Fluid Volume: Goal: Ability to maintain a balanced intake and output will improve Outcome: Not Progressing HD held d/t infection to AVF.   Problem: Nutrition: Goal: Adequate nutrition will be maintained Outcome: Progressing Pt is a feeder, good appetite.   Problem: Fluid Volume: Goal: Hemodynamic stability will improve Outcome: Not Progressing Pt with persistent hypotension.   Problem: Physical Regulation: Goal: Diagnostic test results will improve Outcome: Progressing Awaiting results from blood and wound cultures.

## 2015-12-11 NOTE — Progress Notes (Signed)
Pt had BM with visible bright red blood. Dr. Leslye Peer made aware. New orders placed in EPIC.

## 2015-12-12 ENCOUNTER — Inpatient Hospital Stay (HOSPITAL_COMMUNITY)
Admit: 2015-12-12 | Discharge: 2015-12-12 | Disposition: A | Discharge status: Home / Self Care | Payer: Non-veteran care | Attending: Adult Health | Admitting: Adult Health

## 2015-12-12 ENCOUNTER — Encounter: Payer: Self-pay | Admitting: Gastroenterology

## 2015-12-12 ENCOUNTER — Inpatient Hospital Stay: Payer: Non-veteran care

## 2015-12-12 DIAGNOSIS — I469 Cardiac arrest, cause unspecified: Secondary | ICD-10-CM

## 2015-12-12 LAB — CBC
HEMATOCRIT: 31.5 % — AB (ref 40.0–52.0)
HEMATOCRIT: 23.6 % — AB (ref 40.0–52.0)
HEMOGLOBIN: 7.3 g/dL — AB (ref 13.0–18.0)
Hemoglobin: 9.9 g/dL — ABNORMAL LOW (ref 13.0–18.0)
MCH: 26 pg (ref 26.0–34.0)
MCH: 25.5 pg — ABNORMAL LOW (ref 26.0–34.0)
MCHC: 31.4 g/dL — AB (ref 32.0–36.0)
MCHC: 31.1 g/dL — ABNORMAL LOW (ref 32.0–36.0)
MCV: 82.8 fL (ref 80.0–100.0)
MCV: 81.8 fL (ref 80.0–100.0)
PLATELETS: 140 10*3/uL — AB (ref 150–440)
Platelets: 95 10*3/uL — ABNORMAL LOW (ref 150–440)
RBC: 3.81 MIL/uL — ABNORMAL LOW (ref 4.40–5.90)
RBC: 2.88 MIL/uL — AB (ref 4.40–5.90)
RDW: 18.5 % — ABNORMAL HIGH (ref 11.5–14.5)
RDW: 18.6 % — ABNORMAL HIGH (ref 11.5–14.5)
WBC: 53 10*3/uL — AB (ref 3.8–10.6)
WBC: 39.9 10*3/uL — AB (ref 3.8–10.6)

## 2015-12-12 LAB — PROTIME-INR
INR: 1.46
PROTHROMBIN TIME: 17.9 s — AB (ref 11.4–15.2)

## 2015-12-12 LAB — EXPECTORATED SPUTUM ASSESSMENT W REFEX TO RESP CULTURE

## 2015-12-12 LAB — RENAL FUNCTION PANEL
ALBUMIN: 1.4 g/dL — AB (ref 3.5–5.0)
ANION GAP: 9 (ref 5–15)
ANION GAP: 7 (ref 5–15)
ANION GAP: 10 (ref 5–15)
Albumin: 1.3 g/dL — ABNORMAL LOW (ref 3.5–5.0)
Albumin: 1.1 g/dL — ABNORMAL LOW (ref 3.5–5.0)
Albumin: 1.3 g/dL — ABNORMAL LOW (ref 3.5–5.0)
Anion gap: 11 (ref 5–15)
BUN: 38 mg/dL — AB (ref 6–20)
BUN: 38 mg/dL — ABNORMAL HIGH (ref 6–20)
BUN: 38 mg/dL — AB (ref 6–20)
BUN: 40 mg/dL — ABNORMAL HIGH (ref 6–20)
CALCIUM: 8.4 mg/dL — AB (ref 8.9–10.3)
CHLORIDE: 104 mmol/L (ref 101–111)
CHLORIDE: 101 mmol/L (ref 101–111)
CHLORIDE: 103 mmol/L (ref 101–111)
CO2: 23 mmol/L (ref 22–32)
CO2: 22 mmol/L (ref 22–32)
CO2: 20 mmol/L — AB (ref 22–32)
CO2: 20 mmol/L — AB (ref 22–32)
CREATININE: 3.29 mg/dL — AB (ref 0.61–1.24)
CREATININE: 3.25 mg/dL — AB (ref 0.61–1.24)
CREATININE: 3.33 mg/dL — AB (ref 0.61–1.24)
Calcium: 8.9 mg/dL (ref 8.9–10.3)
Calcium: 8.8 mg/dL — ABNORMAL LOW (ref 8.9–10.3)
Calcium: 9.2 mg/dL (ref 8.9–10.3)
Chloride: 102 mmol/L (ref 101–111)
Creatinine, Ser: 3.47 mg/dL — ABNORMAL HIGH (ref 0.61–1.24)
GFR calc Af Amer: 20 mL/min — ABNORMAL LOW (ref >60)
GFR calc Af Amer: 20 mL/min — ABNORMAL LOW (ref >60)
GFR calc non Af Amer: 17 mL/min — ABNORMAL LOW (ref >60)
GFR calc non Af Amer: 18 mL/min — ABNORMAL LOW (ref >60)
GFR calc non Af Amer: 16 mL/min — ABNORMAL LOW (ref >60)
GFR, EST AFRICAN AMERICAN: 20 mL/min — AB (ref >60)
GFR, EST AFRICAN AMERICAN: 19 mL/min — AB (ref >60)
GFR, EST NON AFRICAN AMERICAN: 17 mL/min — AB (ref >60)
GLUCOSE: 185 mg/dL — AB (ref 65–99)
GLUCOSE: 289 mg/dL — AB (ref 65–99)
GLUCOSE: 216 mg/dL — AB (ref 65–99)
Glucose, Bld: 186 mg/dL — ABNORMAL HIGH (ref 65–99)
PHOSPHORUS: 3.6 mg/dL (ref 2.5–4.6)
POTASSIUM: 4.3 mmol/L (ref 3.5–5.1)
POTASSIUM: 5.1 mmol/L (ref 3.5–5.1)
POTASSIUM: 4.2 mmol/L (ref 3.5–5.1)
Phosphorus: 3.2 mg/dL (ref 2.5–4.6)
Phosphorus: 3.6 mg/dL (ref 2.5–4.6)
Phosphorus: 3.3 mg/dL (ref 2.5–4.6)
Potassium: 5.3 mmol/L — ABNORMAL HIGH (ref 3.5–5.1)
SODIUM: 134 mmol/L — AB (ref 135–145)
Sodium: 133 mmol/L — ABNORMAL LOW (ref 135–145)
Sodium: 132 mmol/L — ABNORMAL LOW (ref 135–145)
Sodium: 133 mmol/L — ABNORMAL LOW (ref 135–145)

## 2015-12-12 LAB — BLOOD GAS, ARTERIAL
ACID-BASE DEFICIT: 3.3 mmol/L — AB (ref 0.0–2.0)
ACID-BASE DEFICIT: 7.9 mmol/L — AB (ref 0.0–2.0)
BICARBONATE: 21.2 mmol/L (ref 20.0–28.0)
BICARBONATE: 17.6 mmol/L — AB (ref 20.0–28.0)
FIO2: 0.4
FIO2: 0.4
LHR: 16 {breaths}/min
MECHVT: 420 mL
MECHVT: 420 mL
O2 SAT: 99 %
O2 SAT: 98.6 %
PCO2 ART: 35 mmHg (ref 32.0–48.0)
PCO2 ART: 35 mmHg (ref 32.0–48.0)
PEEP: 5 cmH2O
PEEP: 5 cmH2O
PH ART: 7.39 (ref 7.350–7.450)
PH ART: 7.31 — AB (ref 7.350–7.450)
PO2 ART: 133 mmHg — AB (ref 83.0–108.0)
Patient temperature: 37
Patient temperature: 37
RATE: 16 resp/min
pO2, Arterial: 129 mmHg — ABNORMAL HIGH (ref 83.0–108.0)

## 2015-12-12 LAB — MAGNESIUM
MAGNESIUM: 1.6 mg/dL — AB (ref 1.7–2.4)
MAGNESIUM: 1.8 mg/dL (ref 1.7–2.4)
MAGNESIUM: 1.8 mg/dL (ref 1.7–2.4)
Magnesium: 1.8 mg/dL (ref 1.7–2.4)

## 2015-12-12 LAB — GLUCOSE, CAPILLARY
GLUCOSE-CAPILLARY: 117 mg/dL — AB (ref 65–99)
GLUCOSE-CAPILLARY: 188 mg/dL — AB (ref 65–99)
GLUCOSE-CAPILLARY: 196 mg/dL — AB (ref 65–99)
GLUCOSE-CAPILLARY: 210 mg/dL — AB (ref 65–99)
GLUCOSE-CAPILLARY: 71 mg/dL (ref 65–99)
Glucose-Capillary: 116 mg/dL — ABNORMAL HIGH (ref 65–99)
Glucose-Capillary: 114 mg/dL — ABNORMAL HIGH (ref 65–99)
Glucose-Capillary: 32 mg/dL — CL (ref 65–99)
Glucose-Capillary: 30 mg/dL — CL (ref 65–99)
Glucose-Capillary: 81 mg/dL (ref 65–99)
Glucose-Capillary: 42 mg/dL — CL (ref 65–99)

## 2015-12-12 LAB — EXPECTORATED SPUTUM ASSESSMENT W GRAM STAIN, RFLX TO RESP C

## 2015-12-12 LAB — BASIC METABOLIC PANEL
ANION GAP: 10 (ref 5–15)
BUN: 38 mg/dL — ABNORMAL HIGH (ref 6–20)
CHLORIDE: 101 mmol/L (ref 101–111)
CO2: 20 mmol/L — AB (ref 22–32)
Calcium: 8.9 mg/dL (ref 8.9–10.3)
Creatinine, Ser: 3.42 mg/dL — ABNORMAL HIGH (ref 0.61–1.24)
GFR calc non Af Amer: 17 mL/min — ABNORMAL LOW (ref >60)
GFR, EST AFRICAN AMERICAN: 19 mL/min — AB (ref >60)
GLUCOSE: 294 mg/dL — AB (ref 65–99)
POTASSIUM: 5 mmol/L (ref 3.5–5.1)
Sodium: 131 mmol/L — ABNORMAL LOW (ref 135–145)

## 2015-12-12 LAB — APTT: APTT: 62 s — AB (ref 24–36)

## 2015-12-12 LAB — PROCALCITONIN: PROCALCITONIN: 5.94 ng/mL

## 2015-12-12 LAB — LACTIC ACID, PLASMA
LACTIC ACID, VENOUS: 6.9 mmol/L — AB (ref 0.5–1.9)
LACTIC ACID, VENOUS: 3.5 mmol/L — AB (ref 0.5–1.9)

## 2015-12-12 LAB — FIBRINOGEN: FIBRINOGEN: 201 mg/dL — AB (ref 210–475)

## 2015-12-12 LAB — SEDIMENTATION RATE: Sed Rate: 7 mm/hr (ref 0–20)

## 2015-12-12 LAB — FIBRIN DEGRADATION PROD.(ARMC ONLY): Fibrin Degradation Prod.: >40 — AB (ref <10)

## 2015-12-12 MED ORDER — AMIODARONE HCL IN DEXTROSE 360-4.14 MG/200ML-% IV SOLN
60.0000 mg/h | INTRAVENOUS | Status: AC
  Administered 2015-12-12: 60 mg/h via INTRAVENOUS

## 2015-12-12 MED ORDER — AMIODARONE HCL IN DEXTROSE 360-4.14 MG/200ML-% IV SOLN
30.0000 mg/h | INTRAVENOUS | Status: DC
  Administered 2015-12-13 – 2015-12-15 (×5): 30 mg/h via INTRAVENOUS
  Filled 2015-12-12 (×6): qty 200

## 2015-12-12 MED ORDER — SODIUM CHLORIDE 0.9 % IV BOLUS (SEPSIS)
1000.0000 mL | Freq: Once | INTRAVENOUS | Status: AC
  Administered 2015-12-12: 1000 mL via INTRAVENOUS

## 2015-12-12 MED ORDER — DEXTROSE 10 % IV SOLN: INTRAVENOUS | Status: DC

## 2015-12-12 MED ORDER — MAGNESIUM SULFATE IN D5W 1-5 GM/100ML-% IV SOLN
1.0000 g | Freq: Once | INTRAVENOUS | Status: AC
  Administered 2015-12-12: 1 g via INTRAVENOUS
  Filled 2015-12-12: qty 100

## 2015-12-12 MED FILL — Medication: Qty: 1 | Status: AC

## 2015-12-12 NOTE — Procedures (Signed)
Patient critically ill. Coded this evening. Now requiring CRRT emergently. Placed Temp HD catheter in left femoral vein under sterile conditions via seldinger technique. Good return and flush in all ports. No immediate complications. Patient remains critical.

## 2015-12-12 NOTE — Plan of Care (Signed)
2pm Resent Renal blood panel hemolyzed again

## 2015-12-12 NOTE — Progress Notes (Signed)
[facility]  ROUNDING NOTE   Subjective:   12/8- Patient presented from nursing home via EMS for hypotension. Found to have large decubitus ulcer 12/10- Blood per rectum, transfused 3 units PRBC, EGD- Grade D reflux esophagitis,severe ulcerative esophagitis,  gastric ulcer, intubated, CRRT started   12/11- CRRT paused due to clot in venous line Will be restarted shortly Bedside EGD, towards End of procedure, became hypotensive  ventilator dependent at present  Objective:  Vital signs in last 24 hours:  Temp:  [97.2 F (36.2 C)-98.6 F (37 C)] 97.5 F (36.4 C) (12/11 0729) Pulse Rate:  [55-243] 66 (12/11 0700) Resp:  [0-34] 10 (12/11 0700) BP: (44-160)/(16-134) 120/66 (12/11 0700) SpO2:  [65 %-100 %] 100 % (12/11 0700) Arterial Line BP: (43-232)/(0-77) 134/45 (12/11 0700) FiO2 (%):  [50 %-100 %] 50 % (12/11 0400) Weight:  [97.1 kg (214 lb 1.1 oz)] 97.1 kg (214 lb 1.1 oz) (12/11 0339)  Weight change: 0.03 kg (1.1 oz) Filed Weights   12/10/15 1318 12/12/15 0000 12/12/15 0339  Weight: 97.1 kg (214 lb) 97.1 kg (214 lb 1.1 oz) 97.1 kg (214 lb 1.1 oz)    Intake/Output: I/O last 3 completed shifts: In: 3919.6 [P.O.:50; I.V.:1599.3; Blood:870.3; IV Piggyback:1400] Out: 0    Intake/Output this shift:  Total I/O In: 200 [I.V.:200] Out: 0   Physical Exam: General: No acute distress  Head: Normocephalic, atraumatic. Moist oral mucosal membranes  Eyes: Anicteric  Neck: Supple, trachea midline  Lungs:  Ventilator dependent  Heart: regular  Abdomen:  Soft, nontender, obese  Extremities: Bilateral BKA  Neurologic: Sedated, intubated  Skin: No lesions  Access: RUE AVF - ulcerations - no drainage     Basic Metabolic Panel:  Recent Labs Lab 12/27/2015 2315 12/10/15 0604 12/04/2015 2055 12/12/15 0622  NA 131* 132* 135 134*  K 3.5 3.5 5.0 5.3*  CL 98* 99* 97* 102  CO2 _0 GLUCOSE 166* 152* 261* 185*  BUN 28* 31* 41* 38*  CREATININE 3.08* 3.25* 3.85*  3.29*  CALCIUM 9.1 9.1 8.4* 8.4*  MG  --   --  1.7 1.6*  PHOS  --   --  3.6 3.6    Liver Function Tests:  Recent Labs Lab 12/29/2015 2055 12/12/15 0622  ALBUMIN 1.4* 1.4*   No results for input(s): LIPASE, AMYLASE in the last 168 hours. No results for input(s): AMMONIA in the last 168 hours.  CBC:  Recent Labs Lab 12/13/2015 2315 12/10/15 0604  12/19/2015 1434 12/05/2015 1848 12/19/2015 1852 12/17/2015 2055 12/12/15 0447  WBC 21.7* 23.5*  --   --   --  45.1* 44.0* 53.0*  NEUTROABS 19.4*  --   --   --   --   --   --   --   HGB 8.7* 8.9*  < > 8.2* DUPICATE TEST.  CBC ORDERED [place] PER RN CAROLINE SOGOMO 1925 12/10/2015 10.1* 9.8* 9.9*  HCT 27.0* 27.5*  --   --   --  31.3* 29.6* 31.5*  MCV 79.7* 79.1*  --   --   --  82.8 80.1 82.8  PLT 126* 124*  --   --   --  122* 134* 140*  < > = values in this interval not displayed.  Cardiac Enzymes: No results for input(s): CKTOTAL, CKMB, CKMBINDEX, TROPONINI in the last 168 hours.  BNP: Invalid input(s): POCBNP  CBG:  Recent Labs Lab 12/24/2015 1347 12/08/2015 1938 12/20/2015 2324 12/12/15 0328 12/12/15 0726  GLUCAP 135* 95 217*  116* 114*    Microbiology: Results for orders placed or performed during the hospital encounter of 12/21/2015  Blood culture (routine x 2)     Status: None (Preliminary result)   Collection Time: 12/23/2015 11:15 PM  Result Value Ref Range Status   Specimen Description BLOOD RIGHT WRIST  Final   Special Requests BOTTLES DRAWN AEROBIC AND ANAEROBIC 6CCAERO,5CCANA  Final   Culture NO GROWTH 3 DAYS  Final   Report Status PENDING  Incomplete  Blood culture (routine x 2)     Status: None (Preliminary result)   Collection Time: 12/22/2015 11:31 PM  Result Value Ref Range Status   Specimen Description BLOOD LEFT FOREARM  Final   Special Requests BOTTLES DRAWN AEROBIC AND ANAEROBIC [place]  Final   Culture NO GROWTH 2 DAYS  Final   Report Status PENDING  Incomplete  Aerobic/Anaerobic Culture (surgical/deep wound)      Status: None (Preliminary result)   Collection Time: 12/10/15 10:09 AM  Result Value Ref Range Status   Specimen Description DECUBITIS  Final   Special Requests Immunocompromised  Final   Gram Stain   Final    DEGENERATED CELLULAR MATERIAL PRESENT FEW SQUAMOUS EPITHELIAL CELLS PRESENT ABUNDANT GRAM NEGATIVE RODS MODERATE GRAM POSITIVE RODS FEW GRAM POSITIVE COCCI IN PAIRS    Culture   Final    CULTURE REINCUBATED FOR BETTER GROWTH Performed at [facility]    Report Status PENDING  Incomplete  Aerobic Culture (superficial specimen)     Status: None (Preliminary result)   Collection Time: 12/10/15  1:34 PM  Result Value Ref Range Status   Specimen Description HEMODIALYSIS FISTULA  Final   Special Requests LEFT AVF  Final   Gram Stain   Final    MODERATE WBC PRESENT,BOTH PMN AND MONONUCLEAR NO ORGANISMS SEEN    Culture   Final    NO GROWTH < 24 HOURS Performed at [facility]    Report Status PENDING  Incomplete  Culture, blood (routine x 2)     Status: None (Preliminary result)   Collection Time: 12/10/15  3:24 PM  Result Value Ref Range Status   Specimen Description BLOOD LEFT HAND  Final   Special Requests   Final    BOTTLES DRAWN AEROBIC AND ANAEROBIC  AER 6CC ANA 1CC   Culture NO GROWTH 2 DAYS  Final   Report Status PENDING  Incomplete  Culture, blood (routine x 2)     Status: None (Preliminary result)   Collection Time: 12/10/15  3:33 PM  Result Value Ref Range Status   Specimen Description BLOOD RIGHT HAND  Final   Special Requests BOTTLES DRAWN AEROBIC ONLY  2CC  Final   Culture NO GROWTH 2 DAYS  Final   Report Status PENDING  Incomplete  MRSA PCR Screening     Status: None   Collection Time: 01/01/2016  2:23 PM  Result Value Ref Range Status   MRSA by PCR NEGATIVE NEGATIVE Final    Comment:        The GeneXpert MRSA Assay (FDA approved for NASAL specimens only), is one component of a comprehensive MRSA colonization surveillance program. It  is not intended to diagnose MRSA infection nor to guide or monitor treatment for MRSA infections.   Culture, expectorated sputum-assessment     Status: None   Collection Time: 12/12/15  6:48 AM  Result Value Ref Range Status   Specimen Description TRACHEAL ASPIRATE  Final   Special Requests NONE  Final   Sputum  evaluation   Final    TEST NOT PERFORMED ON TRACH ASP. CREDITED, ORDERED RESP CULTURE SDR 12/12/15    Report Status 12/12/2015 FINAL  Final    Coagulation Studies:  Recent Labs  12/12/2015 1613 12/06/2015 2055 12/12/15 0832  LABPROT 14.8 30.7* 17.9*  INR 1.15 2.87 1.46    Urinalysis: No results for input(s): COLORURINE, LABSPEC, PHURINE, GLUCOSEU, HGBUR, BILIRUBINUR, KETONESUR, PROTEINUR, UROBILINOGEN, NITRITE, LEUKOCYTESUR in the last 72 hours.  Invalid input(s): APPERANCEUR    Imaging: Portable Chest Xray  Result Date: 12/12/2015 CLINICAL DATA:  Respiratory failure. EXAM: PORTABLE CHEST 1 VIEW COMPARISON:  Cough 10/2015. FINDINGS: Endotracheal tube terminates 3.6 cm above the carina. Pacemaker lead tips are in the right atrium and right ventricle. Defibrillator pad overlies of the medial left chest. Heart size stable. Thoracic aorta is calcified. There may be minimal left perihilar airspace opacification. Difficult to exclude developing left lower lobe consolidation. No definite pleural fluid. No pleural fluid. IMPRESSION: 1. Left perihilar opacification may be due to atelectasis. Developing pneumonia is not excluded. 2. Difficult to exclude developing left lower lobe consolidation. 3.  Aortic atherosclerosis (ICD10-170.0). Electronically Signed   By: Lorin Picket M.D.   On: 12/12/2015 07:17   Dg Chest Port 1 View  Result Date: 12/06/2015 CLINICAL DATA:  Endotracheal tube placement EXAM: PORTABLE CHEST 1 VIEW COMPARISON:  1656 hours on the same day FINDINGS: Slightly low-lying endotracheal tube tip approximately 2.3 cm above the carina. Pullback approximately 1 cm.  Endotracheal cuff appears slightly over distended as well. Heart is borderline enlarged. There is atelectasis at left lung base. External defibrillator device projects over the heart and mediastinum. There is aortic atherosclerosis at the arch. No suspicious osseous abnormality. Cardiac pacing device projects over the left axilla with right atrial and right ventricular leads in place. IMPRESSION: Low-lying endotracheal tube tip position seen 2.3 cm above the carina with slightly over distended endotracheal tube cuff. Pullback approximately 1 cm. No change in cardiomegaly. Aortic atherosclerosis. Electronically Signed   By: Ashley Royalty M.D.   On: 12/07/2015 19:32   Dg Abd Acute W/chest  Result Date: 12/02/2015 CLINICAL DATA:  GI bleed.  Abdominal distension. EXAM: DG ABDOMEN ACUTE W/ 1V CHEST COMPARISON:  Chest radiograph 12/04/2015. CT abdomen and pelvis 11/28/2015. 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 Dual lead pacemaker remains in place. The cardiac silhouette remains mildly enlarged. Aortic atherosclerosis is again seen. The lungs are slightly less well inflated than on the prior study, and there is minimal left basilar atelectasis. No sizable pleural effusion or pneumothorax is identified. A right axillary stent is again seen. There is no evidence of intraperitoneal free air. No dilated loops of bowel are seen to suggest obstruction. A small to moderate amount of stool is present in the proximal colon, with only a small amount of stool more distally. A 5.6 cm abdominal aortic aneurysm is noted, more fully assessed on recent CT. Surgical clips are present in the upper abdomen and pelvis. Lumbar spondylosis is noted. IMPRESSION: 1. Minimal left basilar atelectasis. 2. Nonobstructed bowel gas pattern. 3. Aortic atherosclerosis.  5.6 cm abdominal aortic aneurysm. Electronically Signed   By: Logan Bores M.D.   On: 12/08/2015 17:33     Medications:   . epinephrine Stopped (12/12/15 0547)   . fentaNYL infusion INTRAVENOUS 75 mcg/hr (12/12/15 0730)  . norepinephrine (LEVOPHED) Adult infusion 33 mcg/min (12/12/15 0951)  . pantoprozole (PROTONIX) infusion 8 mg/hr (12/12/15 0730)  . pureflow 2,000 mL/hr at 12/12/15 0733  .  vasopressin (PITRESSIN) infusion - *FOR SHOCK* 0.03 Units/min (12/12/15 0734)   . atorvastatin  40 mg Oral QHS  . bacitracin  1 application Topical Daily  . brimonidine  1 drop Left Eye TID  . calcium-vitamin D  1 tablet Oral BID  . chlorhexidine gluconate (MEDLINE KIT)  15 mL Mouth Rinse BID  . cinacalcet  120 mg Oral Q breakfast  . epoetin (EPOGEN/PROCRIT) injection  10,000 Units Intravenous Q T,Th,Sa-HD  . feeding supplement (NEPRO CARB STEADY)  237 mL Oral TID  . folic acid  2 mg Oral Daily  . gabapentin  200 mg Oral QHS  . insulin aspart  0-15 Units Subcutaneous Q4H  . magnesium sulfate 1 - 4 g bolus IVPB  1 g Intravenous Once  . mouth rinse  15 mL Mouth Rinse 10 times per day  . multivitamin  1 tablet Oral Daily  . [START ON 2015/12/22] pantoprazole  40 mg Intravenous Q12H  . piperacillin-tazobactam (ZOSYN)  IV  3.375 g Intravenous Q8H  . prednisoLONE acetate  2 drop Both Eyes BID  . ranolazine  500 mg Oral BID  . senna  2 tablet Oral Daily  . sevelamer carbonate  2,400 mg Oral TID WC  . sodium chloride flush  10-40 mL Intracatheter Q12H  . sodium hypochlorite   Irrigation BID  . vancomycin  1,000 mg Intravenous Q24H   acetaminophen **OR** acetaminophen, bisacodyl, fentaNYL, heparin, ipratropium-albuterol, midazolam, midazolam, nitroGLYCERIN, ondansetron **OR** ondansetron (ZOFRAN) IV, oxyCODONE, polyvinyl alcohol, sennosides, sodium chloride, sodium chloride flush  Assessment/ Plan:  70 y.o. male with a PMHx of ESRD on HD at the [facility], anemia chronic kidney disease, secondary hyperparathyroidism, aortic aneurysm, bilateral below the knee amputation, peptic ulcer disease  TTS [facility] AVF  1.  ESRD on HD:   - CRRT for now  2. Sepsis:  source seems to be from infected decubitus ulcer or could be the fistula. With hypotension and leukocytosis - empiric pip/tazo and vanco    3.  Anemia chronic kidney disease: hemoglobin 9.9. With thrombocytopenia - epo with HD treatment.   4.  Secondary hyperparathyroidism: PTH 156 on 11/29/15. Calcium and phosphorus at goal.  - Cinacalcet - sevelamer   LOS: 2 Gregory Livingston 12/11/201710:29 AM

## 2015-12-12 NOTE — Plan of Care (Signed)
Renal labs resulted notified [facility] MD since she had requested to be notified.   She stated that she has completed things on her end

## 2015-12-12 NOTE — Plan of Care (Signed)
Nell core sensor was above the right eyebrow, and was changed to left upper eyebrow.

## 2015-12-12 NOTE — Plan of Care (Signed)
10am Renal panel blood draw hemoyzed

## 2015-12-12 NOTE — Progress Notes (Signed)
Decreased to 45% fio2 

## 2015-12-12 NOTE — Care Management (Signed)
Received call from [facility] ext. [phone] stating that they are off diversion for dialysis patient and if patient is stable- they can take him. I spoke with patient's daughter Gaspard Wilder by phone [phone] to make sure it was okay with her and she agrees if patient is stable. Paper for [facility] transfer placed at patient bedside for daughter to compete to start process.

## 2015-12-12 NOTE — ED Provider Notes (Signed)
[place]  Department of Emergency Medicine   Code Blue CONSULT NOTE  Chief Complaint: Cardiac arrest/unresponsive   Level V Caveat: Unresponsive  History of present illness: I was contacted by the hospital for a CODE BLUE cardiac arrest upstairs and presented to the patient's bedside.  Patient was noted to have gone into v tach by nursing staff and pulses were lost. Patient was started on CPR.   Physical Exam  Gen: unresponsive, intubated. Active CPR in progress. Cardiovascular: pulseless  Resp: Intubated, being bagged. Abd: nondistended  Neuro: GCS 3  Procedures   CRITICAL CARE Performed by: Nance Pear Total critical care time: 30 Critical care time was exclusive of separately billable procedures and treating other patients. Critical care was necessary to treat or prevent imminent or life-threatening deterioration. Critical care was time spent personally by me on the following activities: development of treatment plan with patient and/or surrogate as well as nursing, discussions with consultants, evaluation of patient's response to treatment, examination of patient, obtaining history from patient or surrogate, ordering and performing treatments and interventions, ordering and review of laboratory studies, ordering and review of radiographic studies, pulse oximetry and re-evaluation of patient's condition.  Cardiopulmonary Resuscitation (CPR) Procedure Note  Directed/Performed by: Nance Pear I personally directed ancillary staff and/or performed CPR in an effort to regain return of spontaneous circulation and to maintain cardiac, neuro and systemic perfusion.    Medical Decision making  Arrived to patient's tremor active CPR was in progress. Please see nursing note for exact timing of CODE BLUE drugs and CPR. Patient was initially given epinephrine. However on recheck he was noted be in V. tach. Patient was shocked 2 times. He then regained pulses.  His EKG did show a widened QRS complex. Given that he had a good blood pressure medications rather than further shock, were given. He did respond well to amiodarone and went back to a narrow rhythm. I discussed with nursing staff blood work but should be sent off. Nursing staff will contact the ICU doctor.     Nance Pear, MD 12/12/15 2325

## 2015-12-12 NOTE — Plan of Care (Signed)
Notified MD that the Fibrin Degradation product was greater than 40

## 2015-12-12 NOTE — Plan of Care (Signed)
nurse was in the room documenting when the pt went into v-tach,HR in the 170s code blue initiated and compressions started.  [facility] declined to take pt at this time due to pt being unstable  See code blue noted

## 2015-12-12 NOTE — Progress Notes (Signed)
Discussed case with pt and pt's caregiver (daughter).  I explained the patient's clinical course of the last 24 hrs.   A further downward course is predictable and likely, will likely involve further hospital admissions, intubation, with further decline in functional status.  At this time daughter stated that she wanted full medical care and aggressive measures, including mechanical ventilation and continuation of dialysis.  We decided that patient's code status would be FULL CODE, and family request transfer to [facility].     - Vilinda Boehringer, M.D. Time spent in discussion 30 min.  12/12/2015

## 2015-12-12 NOTE — Progress Notes (Signed)
Decreased to 40%, sats 100%

## 2015-12-12 NOTE — Plan of Care (Signed)
Notfied [facility] MD [place] about the code blue we had on him at 0615pm.  MD stated at this time he will not be accepting the pt due to pt being unstable, Stated will be considered at a later time.

## 2015-12-12 NOTE — Progress Notes (Signed)
[place] responded to a Code Blue for Pt in [place]. Pt was receiving CRP on my arrival. Pt was resuscitated. [place] attempted several times unsuccessfully to contact the Daughter. Niece arrived and was able to contact the daughter. The daughter will arrive in about an hour. [place] provided the ministry of support to medical staff and prayer with family members present. [place] asked to be notified of her arrival and will follow up when the daughter arrives.    12/12/15 2100  Clinical Encounter Type  Visited With Patient;Health care provider  Visit Type Initial;Spiritual support;Code;Critical Care  Referral From Nurse  Consult/Referral To Chaplain  Spiritual Encounters  Spiritual Needs Prayer;Emotional;Grief support  Stress Factors  Patient Stress Factors Health changes;Major life changes  Family Stress Factors Health changes;Loss of control;Major life changes

## 2015-12-12 NOTE — Care Management (Addendum)
VA forms have been faxed to [facility] for transfer pending VA review and follow up with ICU team. I confirmed with Tania at [facility] that fax was received and being reviewed at this time.

## 2015-12-12 NOTE — Progress Notes (Signed)
Pt. On 2 pressors w/ protonix and fent. Gtt's running (see MAR for details). Pt.'s lactic/WBC have both increased to critical levels this AM- Ms. Tukov NP aware. 1L NS bolus ordered and given. CRRT ongoing with 2K bath. Pt.'s Hgb is stable and no s/s of bleeding after 2100. Pt. Remains on PRVC, FiO2 was decreased from 100% to 50% and has O2 Sats >100%. Spoke w/ pt.'s daughter and updated her, she will be by after 0800.4

## 2015-12-12 NOTE — Plan of Care (Signed)
Resent another renal panel to lab

## 2015-12-12 NOTE — Consult Note (Signed)
[place] Nurse wound consult note Reason for Consult: Unstageable pressure injury to sacrum/bilateral buttocks, present on admission.  Surgical consult has been conducted, per bedside RN.  Surgery to re-evaluate when more stable.  Wound type:Unstageable pressure injury to sacrum.  Pressure Ulcer POA: Yes Measurement: 13 cm x 22 cm wound bed not visible due to 100% gray slough.   Wound bed:100% gray slough Drainage (amount, consistency, odor) Minimal purulent drainage.  Foul necrotic odor.  Periwound:Intact Dressing procedure/placement/frequency:Cleanse wound to sacrum/buttocks with NS and pat gently dry.  Apply Dakin's moist gauze to to wound bed.  Cover with dry 4x4 gauze.  Secure with ABD pad and tape.  Change twice daily.  Will not follow at this time.  Please re-consult if needed.  Domenic Moras RN BSN [place] Pager [phone]

## 2015-12-12 NOTE — Progress Notes (Signed)
Initial Nutrition Assessment  DOCUMENTATION CODES:   Obesity unspecified  INTERVENTION:  Per discussion in rounds will consider starting TF if patient does not transfer to [facility] hospital.  Patient will require access (NG vs OG) and confirmation prior to starting TF. If patient to remain at [facility] and access placed and confirmed, recommend initiating Vital High Protein @ 20 ml/hr. After 8 hours can advance to goal of Vital High Protein @ 45 ml/hr + Pro-Stat 30 ml daily. Goal regimen provides 1180 kcal, 110 grams protein, 907 ml H2O daily.  NUTRITION DIAGNOSIS:   Inadequate oral intake related to inability to eat as evidenced by NPO status.  GOAL:   Provide needs based on ASPEN/SCCM guidelines  MONITOR:   Vent status, Labs, Weight trends, I & O's, Skin, TF tolerance  REASON FOR ASSESSMENT:   Ventilator, Low Braden    ASSESSMENT:   70 y.o. male with a known history of Abdominal aortic aneurysm, diabetes mellitus type 2, end-stage renal disease on dialysis, hypertension, hyperparathyroidism, multiple myeloma, peptic ulcer disease, peripheral vascular disease is a patient of [facility]. Patient presented from [facility] to ED with hypotension. Patient found to have ARF with severe respiratory acidosis, cardiac arrest after upper GI endoscopy, Acute GI bleed (has resolved now s/p endoscopy), severe sepsis from infected decubitus ulcer.   Per rounds patient is in HD T/R/Sa. He is anuric.  Patient was 184 lbs (83.5 kg) on admission. BMI with this weight is 34.8. Will use admission weight to estimate needs.  Patient is currently intubated on ventilator support. He was intubated after upper GI endoscopy. MV: 6.7 L/min Temp (24hrs), Avg:97.9 F (36.6 C), Min:97.2 F (36.2 C), Max:98.6 F (37 C)  MAP: was 64 at 0015 today, but since has been >/= 65. Patient weaned from 3 pressors to 2 today.  Access: Patient with no access at this time (OG vs NG).  Medications  reviewed and include: Novolog sliding scale Q4hrs, pantoprazole, Zosyn, vancomycin, fentanyl gtt, Levophed gtt, vasopressin gtt, Pureflow Solution 2K-3Ca-67m for CRRT.  Labs reviewed: CBG 32-217 past 24 hrs, Sodium 134, Potassium 5.3, BUN 38, Creatinine 3.29, Magnesium 1.6, Albumin 1.4.   Nutrition-Focused physical exam completed. Findings are no fat depletion, no muscle depletion, and mild-moderate edema. Patient with bilateral BKAs.  Patient with temporary HD catheter in left femoral vein. Patient was placed on CRRT but it was stopped this AM due to venous access clot. Plan to resume today.   Diet Order:  Diet NPO time specified  Skin:  Wound (see comment) (US/Full Thickness to buttocks, Stg II to sacrum)  Last BM:  12/16/2015  Height:   Ht Readings from Last 1 Encounters:  12/27/2015 5' 1"  (1.549 m)    Weight:   Wt Readings from Last 1 Encounters:  12/12/15 214 lb 1.1 oz (97.1 kg)    Ideal Body Weight:  50.9 kg - Did not adjust for b/l BKAs as recorded height appears to be post-BKAs.  BMI:  Body mass index is 40.45 kg/m.  Estimated Nutritional Needs:   Kcal:  [phone] (11-14 kcal/kg)  Protein:  >/= 102 grams (2 grams/kg IBW)  Fluid:  >/= 1.2 L/day or per MD in setting of ESRD on CRRT  EDUCATION NEEDS:   Education needs no appropriate at this time  LWilley Blade MS, RD, LDN Pager: [phone] Hours Pager: [phone]

## 2015-12-12 NOTE — Plan of Care (Signed)
Venous Access clotted off, CRRT stopped and clots pulled back out and the HD cath flushed with ease.  Will restart pt with a new set of CRRT essentials

## 2015-12-12 NOTE — Anesthesia Postprocedure Evaluation (Signed)
Anesthesia Post Note  Patient: Abdirizak Risenhoover  Procedure(s) Performed: Procedure(s) (LRB): ESOPHAGOGASTRODUODENOSCOPY (EGD) WITH PROPOFOL (N/A)  Patient location during evaluation: ICU Anesthesia Type: General Level of consciousness: awake and alert Pain management: pain level controlled Vital Signs Assessment: post-procedure vital signs reviewed and stable Respiratory status: spontaneous breathing, nonlabored ventilation, respiratory function stable and patient connected to nasal cannula oxygen Cardiovascular status: blood pressure returned to baseline and stable Postop Assessment: no signs of nausea or vomiting Anesthetic complications: no    Last Vitals:  Vitals:   12/12/15 0700 12/12/15 0729  BP: 120/66   Pulse: 66   Resp: 10   Temp:  36.4 C    Last Pain:  Vitals:   12/12/15 0729  TempSrc: Axillary  PainSc:                  Alison Stalling

## 2015-12-12 NOTE — Progress Notes (Signed)
CH made a follow up visit. [place] escorted the Daughter and Sister-In-Law to Pt room. CH provided the ministry of presence as the MD gave them an update. CH offered a calming presence for the family and medical staff. CH is available for follow up as needed.

## 2015-12-12 NOTE — Plan of Care (Signed)
MD from [place] called to speak to nurse about the care of the pt, what drips pt was on, what vent settings were, what brought him to the hospital and into the ICU, and what we had done so far in terms of treatment.  [place] [facility] MD

## 2015-12-12 NOTE — Progress Notes (Signed)
Pt care assumed at this time.  Pt found connected to crash cart monitor, with ER MD at bedside.  Code complete at this time.  Pt has pulse and blood pressure.  Levo currently infusing at 63mcg/min, and vaso is at .03 units/min. Pt given amidarone bolus, see flow sheet.  Awaiting further orders, and family to arrive for family meeting.

## 2015-12-12 NOTE — Plan of Care (Signed)
Lactic critical result 3.5 MD notified

## 2015-12-13 ENCOUNTER — Inpatient Hospital Stay: Payer: Non-veteran care

## 2015-12-13 LAB — AEROBIC CULTURE W GRAM STAIN (SUPERFICIAL SPECIMEN)

## 2015-12-13 LAB — CBC WITH DIFFERENTIAL/PLATELET
BASOS ABS: 0 10*3/uL (ref 0–0.1)
BLASTS: 0 %
Band Neutrophils: 1 %
Basophils Relative: 0 %
Eosinophils Absolute: 0 10*3/uL (ref 0–0.7)
Eosinophils Relative: 0 %
HEMATOCRIT: 23.8 % — AB (ref 40.0–52.0)
Hemoglobin: 7.9 g/dL — ABNORMAL LOW (ref 13.0–18.0)
LYMPHS PCT: 20 %
Lymphs Abs: 6.9 10*3/uL — ABNORMAL HIGH (ref 1.0–3.6)
MCH: 26.4 pg (ref 26.0–34.0)
MCHC: 33.1 g/dL (ref 32.0–36.0)
MCV: 79.8 fL — AB (ref 80.0–100.0)
METAMYELOCYTES PCT: 1 %
MONOS PCT: 4 %
Monocytes Absolute: 1.4 10*3/uL — ABNORMAL HIGH (ref 0.2–1.0)
Myelocytes: 0 %
NEUTROS ABS: 26.2 10*3/uL — AB (ref 1.4–6.5)
NRBC: 0 /100{WBCs}
Neutrophils Relative %: 74 %
Other: 0 %
Platelets: 95 10*3/uL — ABNORMAL LOW (ref 150–440)
Promyelocytes Absolute: 0 %
RBC: 2.98 MIL/uL — AB (ref 4.40–5.90)
RDW: 18.5 % — AB (ref 11.5–14.5)
WBC: 34.5 10*3/uL — AB (ref 3.8–10.6)

## 2015-12-13 LAB — RENAL FUNCTION PANEL
ALBUMIN: 1.4 g/dL — AB (ref 3.5–5.0)
ALBUMIN: 1.3 g/dL — AB (ref 3.5–5.0)
ALBUMIN: 1.3 g/dL — AB (ref 3.5–5.0)
ANION GAP: 8 (ref 5–15)
ANION GAP: 9 (ref 5–15)
ANION GAP: 8 (ref 5–15)
ANION GAP: 14 (ref 5–15)
Albumin: 1.3 g/dL — ABNORMAL LOW (ref 3.5–5.0)
Albumin: 1.4 g/dL — ABNORMAL LOW (ref 3.5–5.0)
Albumin: 1.3 g/dL — ABNORMAL LOW (ref 3.5–5.0)
Anion gap: 11 (ref 5–15)
Anion gap: 12 (ref 5–15)
BUN: 42 mg/dL — ABNORMAL HIGH (ref 6–20)
BUN: 43 mg/dL — ABNORMAL HIGH (ref 6–20)
BUN: 45 mg/dL — ABNORMAL HIGH (ref 6–20)
BUN: 44 mg/dL — AB (ref 6–20)
BUN: 39 mg/dL — AB (ref 6–20)
BUN: 43 mg/dL — ABNORMAL HIGH (ref 6–20)
CALCIUM: 9.1 mg/dL (ref 8.9–10.3)
CALCIUM: 9.1 mg/dL (ref 8.9–10.3)
CALCIUM: 9.1 mg/dL (ref 8.9–10.3)
CALCIUM: 9 mg/dL (ref 8.9–10.3)
CALCIUM: 8.9 mg/dL (ref 8.9–10.3)
CHLORIDE: 101 mmol/L (ref 101–111)
CHLORIDE: 100 mmol/L — AB (ref 101–111)
CO2: 22 mmol/L (ref 22–32)
CO2: 21 mmol/L — ABNORMAL LOW (ref 22–32)
CO2: 22 mmol/L (ref 22–32)
CO2: 22 mmol/L (ref 22–32)
CO2: 22 mmol/L (ref 22–32)
CO2: 18 mmol/L — AB (ref 22–32)
CREATININE: 3.27 mg/dL — AB (ref 0.61–1.24)
Calcium: 9 mg/dL (ref 8.9–10.3)
Chloride: 103 mmol/L (ref 101–111)
Chloride: 102 mmol/L (ref 101–111)
Chloride: 103 mmol/L (ref 101–111)
Chloride: 100 mmol/L — ABNORMAL LOW (ref 101–111)
Creatinine, Ser: 3.57 mg/dL — ABNORMAL HIGH (ref 0.61–1.24)
Creatinine, Ser: 3.59 mg/dL — ABNORMAL HIGH (ref 0.61–1.24)
Creatinine, Ser: 3.66 mg/dL — ABNORMAL HIGH (ref 0.61–1.24)
Creatinine, Ser: 3.66 mg/dL — ABNORMAL HIGH (ref 0.61–1.24)
Creatinine, Ser: 3.56 mg/dL — ABNORMAL HIGH (ref 0.61–1.24)
GFR calc Af Amer: 18 mL/min — ABNORMAL LOW (ref >60)
GFR calc Af Amer: 18 mL/min — ABNORMAL LOW (ref >60)
GFR calc Af Amer: 20 mL/min — ABNORMAL LOW (ref >60)
GFR calc Af Amer: 18 mL/min — ABNORMAL LOW (ref >60)
GFR calc non Af Amer: 15 mL/min — ABNORMAL LOW (ref >60)
GFR calc non Af Amer: 17 mL/min — ABNORMAL LOW (ref >60)
GFR calc non Af Amer: 16 mL/min — ABNORMAL LOW (ref >60)
GFR, EST AFRICAN AMERICAN: 18 mL/min — AB (ref >60)
GFR, EST AFRICAN AMERICAN: 18 mL/min — AB (ref >60)
GFR, EST NON AFRICAN AMERICAN: 16 mL/min — AB (ref >60)
GFR, EST NON AFRICAN AMERICAN: 16 mL/min — AB (ref >60)
GFR, EST NON AFRICAN AMERICAN: 15 mL/min — AB (ref >60)
GLUCOSE: 199 mg/dL — AB (ref 65–99)
GLUCOSE: 189 mg/dL — AB (ref 65–99)
GLUCOSE: 136 mg/dL — AB (ref 65–99)
GLUCOSE: 113 mg/dL — AB (ref 65–99)
GLUCOSE: 115 mg/dL — AB (ref 65–99)
Glucose, Bld: 182 mg/dL — ABNORMAL HIGH (ref 65–99)
PHOSPHORUS: 3.2 mg/dL (ref 2.5–4.6)
PHOSPHORUS: 3 mg/dL (ref 2.5–4.6)
PHOSPHORUS: 3.1 mg/dL (ref 2.5–4.6)
PHOSPHORUS: 3.1 mg/dL (ref 2.5–4.6)
PHOSPHORUS: 4 mg/dL (ref 2.5–4.6)
POTASSIUM: 4.2 mmol/L (ref 3.5–5.1)
POTASSIUM: 4.3 mmol/L (ref 3.5–5.1)
POTASSIUM: 4.1 mmol/L (ref 3.5–5.1)
Phosphorus: 3.2 mg/dL (ref 2.5–4.6)
Potassium: 4.3 mmol/L (ref 3.5–5.1)
Potassium: 4.4 mmol/L (ref 3.5–5.1)
Potassium: 4.7 mmol/L (ref 3.5–5.1)
SODIUM: 133 mmol/L — AB (ref 135–145)
SODIUM: 132 mmol/L — AB (ref 135–145)
SODIUM: 133 mmol/L — AB (ref 135–145)
SODIUM: 133 mmol/L — AB (ref 135–145)
SODIUM: 135 mmol/L (ref 135–145)
Sodium: 132 mmol/L — ABNORMAL LOW (ref 135–145)

## 2015-12-13 LAB — ECHOCARDIOGRAM COMPLETE
FS: 31 % (ref 28–44)
Height: 61 in
IV/PV OW: 1.01
LA ID, A-P, ES: 30 mm
LA diam index: 1.43 cm/m2
LA vol: 84.1 mL
LAVOLA4C: 71.4 mL
LAVOLIN: 40.1 mL/m2
LEFT ATRIUM END SYS DIAM: 30 mm
PW: 16.6 mm — AB (ref 0.6–1.1)
Weight: 3425.07 oz

## 2015-12-13 LAB — AEROBIC CULTURE  (SUPERFICIAL SPECIMEN): CULTURE: NO GROWTH

## 2015-12-13 LAB — BASIC METABOLIC PANEL
Anion gap: 12 (ref 5–15)
BUN: 41 mg/dL — ABNORMAL HIGH (ref 6–20)
CHLORIDE: 99 mmol/L — AB (ref 101–111)
CO2: 20 mmol/L — ABNORMAL LOW (ref 22–32)
Calcium: 8.8 mg/dL — ABNORMAL LOW (ref 8.9–10.3)
Creatinine, Ser: 3.31 mg/dL — ABNORMAL HIGH (ref 0.61–1.24)
GFR calc non Af Amer: 17 mL/min — ABNORMAL LOW (ref >60)
GFR, EST AFRICAN AMERICAN: 20 mL/min — AB (ref >60)
Glucose, Bld: 152 mg/dL — ABNORMAL HIGH (ref 65–99)
POTASSIUM: 4.4 mmol/L (ref 3.5–5.1)
SODIUM: 131 mmol/L — AB (ref 135–145)

## 2015-12-13 LAB — GLUCOSE, CAPILLARY
GLUCOSE-CAPILLARY: 65 mg/dL (ref 65–99)
GLUCOSE-CAPILLARY: 213 mg/dL — AB (ref 65–99)
GLUCOSE-CAPILLARY: 163 mg/dL — AB (ref 65–99)
GLUCOSE-CAPILLARY: 93 mg/dL (ref 65–99)
Glucose-Capillary: 173 mg/dL — ABNORMAL HIGH (ref 65–99)
Glucose-Capillary: 11 mg/dL — CL (ref 65–99)
Glucose-Capillary: 277 mg/dL — ABNORMAL HIGH (ref 65–99)

## 2015-12-13 LAB — PROCALCITONIN: Procalcitonin: 49.8 ng/mL

## 2015-12-13 LAB — MAGNESIUM
MAGNESIUM: 1.9 mg/dL (ref 1.7–2.4)
MAGNESIUM: 1.8 mg/dL (ref 1.7–2.4)
MAGNESIUM: 1.8 mg/dL (ref 1.7–2.4)
Magnesium: 1.8 mg/dL (ref 1.7–2.4)
Magnesium: 1.9 mg/dL (ref 1.7–2.4)
Magnesium: 1.7 mg/dL (ref 1.7–2.4)

## 2015-12-13 LAB — APTT: APTT: 57 s — AB (ref 24–36)

## 2015-12-13 LAB — LACTIC ACID, PLASMA: Lactic Acid, Venous: 3.1 mmol/L (ref 0.5–1.9)

## 2015-12-13 LAB — VANCOMYCIN, RANDOM: VANCOMYCIN RM: 29

## 2015-12-13 MED ORDER — ACETAMINOPHEN 325 MG PO TABS
650.0000 mg | ORAL_TABLET | Freq: Four times a day (QID) | ORAL | Status: DC | PRN
  Administered 2015-12-13 – 2015-12-14 (×2): 650 mg via ORAL
  Filled 2015-12-13 (×3): qty 2

## 2015-12-13 MED ORDER — ACETAMINOPHEN 650 MG RE SUPP: 650.0000 mg | Freq: Four times a day (QID) | RECTAL | Status: DC | PRN

## 2015-12-13 MED ORDER — VANCOMYCIN HCL IN DEXTROSE 1-5 GM/200ML-% IV SOLN: 1000.0000 mg | INTRAVENOUS | Status: DC

## 2015-12-13 MED ORDER — DEXTROSE 50 % IV SOLN
25.0000 mL | Freq: Once | INTRAVENOUS | Status: AC
  Administered 2015-12-13: 25 mL via INTRAVENOUS

## 2015-12-13 MED ORDER — VITAL HIGH PROTEIN PO LIQD
1000.0000 mL | ORAL | Status: DC
  Administered 2015-12-13: 1000 mL

## 2015-12-13 MED ORDER — DEXTROSE 50 % IV SOLN
INTRAVENOUS | Status: AC
  Administered 2015-12-13: 25 mL via INTRAVENOUS
  Filled 2015-12-13: qty 50

## 2015-12-13 MED ORDER — VANCOMYCIN HCL IN DEXTROSE 1-5 GM/200ML-% IV SOLN
1000.0000 mg | INTRAVENOUS | Status: DC
  Administered 2015-12-13: 1000 mg via INTRAVENOUS
  Filled 2015-12-13 (×2): qty 200

## 2015-12-13 MED FILL — Medication: Qty: 1 | Status: AC

## 2015-12-13 NOTE — Plan of Care (Signed)
Notified MD of the Blood sugar as 65 and that dextrose was given.  Also that the temp was 103.10f axillary and that tylenol suppository was given and bath was given too

## 2015-12-13 NOTE — Progress Notes (Signed)
HD initiated via L fem cath. Venous port sluggish to pull, arterial with good flow. Able to achieve 200bfr but no higher d/t high AP despite flushing and reversing lines. Continue to monitor patient.

## 2015-12-13 NOTE — Care Management (Signed)
Patient did not transfer to [place] due to cardiac issues 12/12/15 ([facility] declined until more stable). Staff updated patient's daughter. I have faxed updated notes to [facility] and attempted to contact [facility] but was not able to leave message. RNCM will continue to follow.

## 2015-12-13 NOTE — Progress Notes (Signed)
Post HD assessment, no change

## 2015-12-13 NOTE — Progress Notes (Signed)
PULMONARY / CRITICAL CARE MEDICINE   Name: Gregory Livingston MRN: [id number] DOB: [birth year]    ADMISSION DATE:  12/03/2015   CONSULTATION DATE: 12/29/2015  REFERRING MD: Dr. Posey Pronto   CHIEF COMPLAINT:  Cardiac arrest  HISTORY OF PRESENT ILLNESS:   This is a 70 y/o AA male, SNF resident with a significant medical history who presented to the ED via EMS with hypotension. EMS was called because SNF staff were concerned that patient is septic. He was found to have a significantly elevated WBC hence he was admitted and treatment for sepsis was initiated. Possible source was thought to be his sacral decubitus ulcer. 01/01/2016 patient was noted to have a large blood stools that evolved to just bright red blood with multiple clots from the rectum. He was transfused 3 units of packed red blood cells and a bedside upper GI endoscopy was performed. Postprocedure, patient became hypoxic, hypotensive and subsequently went into cardiac arrest. He was resuscitated for approximately 10 minutes with return of spontaneous circulation. PCCM was consulted for further management.  His EGD showed severe ulcerative colitis with a massive the stock clean-based gastric ulcer. Rectal bleeding has improved. The patient continues to be severely hypotensive requiring multiple pressors and IV fluids. He has end-stage renal disease and is due for dialysis. However, given his low blood pressure, Nephrology has decided to proceed with continuous renal replacement therapy  PAST MEDICAL HISTORY :  He  has a past medical history of AAA (abdominal aortic aneurysm) ([place]); CHF (congestive heart failure) ([place]); Diabetes mellitus without complication ([place]); Dialysis patient [facility]); Hyperparathyroidism ([place]); Hypertension; Multiple myeloma ([place]); Peptic ulcer with perforation ([place]); and PVD (peripheral vascular disease) ([place]).  REVIEW OF SYSTEMS:   Unable to obtain as patient is currently intubated and sedated  SUBJECTIVE:  Pt remains intubated  pt cardiac arrested 12/11 he went into pulseless vtach ACLS protocol initiated he was given epinephrine x1 dose and shocked x2 with ROSC EKG showed wide QRS complex.  CRRT stopped due to vascular access problems on 12/11  VITAL SIGNS: BP (!) 84/34   Pulse 78   Temp (!) 100.8 F (38.2 C) (Axillary)   Resp (!) 7   Ht 5' 1"  (1.549 m)   Wt 202 lb 2.6 oz (91.7 kg)   SpO2 100%   BMI 38.20 kg/m   HEMODYNAMICS: CVP:  [5 mmHg-13 mmHg] 11 mmHg  VENTILATOR SETTINGS: Vent Mode: PRVC FiO2 (%):  [40 %-50 %] 40 % Set Rate:  [16 bmp] 16 bmp Vt Set:  [420 mL-432 mL] 420 mL PEEP:  [5 cmH20] 5 cmH20 Plateau Pressure:  [13 cmH20-14 cmH20] 14 cmH20  INTAKE / OUTPUT: I/O last 3 completed shifts: In: 4831.4 [I.V.:2561; Blood:870.3; IV Piggyback:1400] Out: 0   PHYSICAL EXAMINATION: General: Acutely ill AA male, older for age Neuro: Sedated, no purposeful movement gag reflex present, bilateral pupils irregular and non reactive  HEENT: Head is normocephalic and atraumatic, ET tube, oral cavity with copious secretions, trachea midline  Cardiovascular: A-V paced with 1st degree HB, no murmur reculture gallop, palpation of left chest wall reveals embedded pacemaker Lungs: diminished bilaterally in all lung fields, rhonchi in anterior lung fields, no wheezing, even non labored mechanically ventilated Abdomen: Nondistended, hypoactive bowel sounds x4, soft Musculoskeletal: Bilateral BKA, 2+ generalized edema Extremities: +2 pulses in upper extremities,right upper arm fistula with a positive bruits and thrill dressing dry and intact Skin: Large sacral decubitus ulcer with serous purulent drainage and sloughing, no rash or other lesions noted  Imaging Portable Chest  Xray  Result Date: 12/12/2015 CLINICAL DATA:  Respiratory failure. EXAM: PORTABLE CHEST 1 VIEW COMPARISON:  Cough 10/2015. FINDINGS: Endotracheal tube terminates 3.6 cm above the carina. Pacemaker lead tips are in the right atrium and right  ventricle. Defibrillator pad overlies of the medial left chest. Heart size stable. Thoracic aorta is calcified. There may be minimal left perihilar airspace opacification. Difficult to exclude developing left lower lobe consolidation. No definite pleural fluid. No pleural fluid. IMPRESSION: 1. Left perihilar opacification may be due to atelectasis. Developing pneumonia is not excluded. 2. Difficult to exclude developing left lower lobe consolidation. 3.  Aortic atherosclerosis (ICD10-170.0). Electronically Signed   By: Lorin Picket M.D.   On: 12/12/2015 07:17    STUDIES:  2-D Echo12/11>> CT Head 12/12>>  CULTURES: Blood 12/9 x2>>negative>> Aerobic culture left av fistula 12/9>>negative Aerobic/Anaerobic culture decubitis ulcer 12/9>>abundant proteus mirabilis>> Sputum 12/11>>  ANTIBIOTICS: Vancomycin 12/09>> Zosyn 12/9>>  SIGNIFICANT EVENTS: 12/30/2015: Admitted with sepsis due to sacral decubitus ulcer. 12/07/2015: Severe GI bleed, bedside endoscopy and cardiac arrests post procedure 12/12/2015: Cardiac arrest vtach rhythm ACLS protocol initiated with ROSC  LINES/TUBES: Right femoral central intravenous catheter 12/09>> Left femoral A-line 12/09>> Left femoral dialysis catheter 12/10>>  DISCUSSION: This is a 70 year old African-American male with end-stage renal disease on hemodialysis, severe peripheral vascular disease, status post bilateral BKA, peptic ulcer with perforation, multiple myeloma, type 2 diabetes, AAA, and congestive heart failure, presenting with acute GI bleed, septic/hypovolemic shock, sepsis from sacral decubitus ulcer, and severe metabolic acidosis with subsequent respiratory failure. Now in possible DIC with multiorgan failure. Overall prognosis is guarded given multiple comorbidities  ASSESSMENT / PLAN:  PULMONARY A: Acute respiratory failure Severe respiratory acidosis Lactic acidosis-lactic acid peaked 6.9 then trended down to 3.5 P:   Continue full  vent support with current settings Chest x-ray and ABG daily Nebulized bronchodilators VAP Protocol  CARDIOVASCULAR A:  Cardiac arrest , status posts upper GI endoscopy 12/10 Vtach cardiac arrest 12/11 Hypotension secondary to shock-initially patient wasn't septic and hypovolemic shock, but there could be a component of cardiogenic shock as well Hx:  Congestive heart failure, Hypertension, AAA, and Peripheral vascular disease P:  Hemodynamic monitoring per ICU protocol Norepinephrine, epinephrine, and vasopressin infusions prn; titrate to map goal of greater than or equal 65 CVP monitoring every 4 hours Cardiology consulted appreciate input  RENAL A:   End-stage renal disease on hemodialysis P:   Nephrology consulted appreciate input Unable to continue CRRT on 12/11 due to vascular access problems per Nephrology will reassess today 12/12 to determine if pt can tolerate hemodialysis  Monitor and correct electrolytes  GASTROINTESTINAL A:   Acute GI bleed, status posts. Upper GI endoscopy-rectal bleeding has subsided P:   GI consulted appreciate input Continue Protonix infusion Monitor hemoglobin and hematocrit and transfuse per protocol Monitor for further rectal bleeding  HEMATOLOGIC A:   Acute blood loss anemia Possible DIC P:  Trend CBC and transfuse for hemoglobin less than 7 with active bleeding Hold chemical VTE prophylaxis, SCD's for VTE prophylaxis Monitor PT INR and APTT  Monitor for further bleeding  INFECTIOUS A:   Severe sepsis from infected decubitus ulcer  Questionable intra-abdominal infection P:   Continue abx as listed above If WBC continues to trend up will consult Infectious Disease  Monitor fever curve Follow cultures   ENDOCRINE A:   Hypoglycemia-resolved  Hx: Type II diabetes  P:   CBG's q4hrs   NEUROLOGIC A:   Acute metabolic encephalopathy secondary to sepsis P:  RASS goal: 0 to -1 Fentanyl and Versed for sedation. CT head  today 12/12 WUA daily Frequent reorientation Promote family presence at bedside  FAMILY  Update: Spoke with pts daughter Gregory Livingston extensively 12/11 she was concerned pt had not been transferred to [facility] hospital informed her pt cardiac arrested preventing transfer.  I personally spoke with bed control personnel at the [facility] hospital 12/11 at 21:25 she informed me the pt did have a bed in the MICU 5 minutes prior to his cardiac arrest on 12/11, however the accepting physician stated due to risk of transfer the [facility] would not accept the pt until he was stable. I relayed this to [facility] and informed her we would be in contact with the [facility] to facilitate pt transfer, however the patient can only transfer once it is deemed safe and the pt is stable for transfer.  She stated she understood and had no further questions.  Marda Stalker, [place] Pager [phone] (please enter 7 digits) PCCM Consult Pager [phone] (please enter 7 digits)   STAFF NOTE: I, Dr. Vilinda Boehringer have personally reviewed patient's available data, including medical history, events of note, physical examination and test results as part of my evaluation. I have discussed with NP Blak[place]y   and other care providers such as pharmacist, RN and RRT.    S: vtach arrest x 53mns yesterday, ACLS/shock performed, this AM with no significant neuro improvement, mild gag,  A: Vtach arrest GI Bleed - stable Acute Respiratory failure - hypoxic Shock (cardiogenic, septic) Hyperthermia ESRD  Ulcerative Esophagitis  P:  Continue full vent support with current settings Nebulized bronchodilators VAP Protocol Hemodynamic monitoring per ICU protocol Norepinephrine, epinephrine, and vasopressin infusions prn; titrate to map goal of greater than or equal 60 Cont with pressor to maintain MAP>60 (B\L BKA).  CVP monitoring every 4 hours Cardiology consulted appreciate input Will need new access for CRRT/HD, currently with  difficult access, nephro coordinating with vascular [place]  EGD 12/10>Severe ulcerative esophagitis in the mid to distal esophagus with a flat red spot seen in a ulcer at the GEJ. No active bleeding seen otherwise. Clean based ulcer   .  Rest per NP/medical resident whose note is outlined above and that I agree with  The patient is critically ill with multiple organ systems failure and requires high complexity decision making for assessment and support, frequent evaluation and titration of therapies, application of advanced monitoring technologies and extensive interpretation of multiple databases.   Critical Care Time devoted to patient care services described in this note is  45 Minutes.   This time reflects time of care of this signee Dr VVilinda Boehringer  This critical care time does not reflect procedure time, or teaching time or supervisory time of PA/NP/Med-student/Med Resident etc but could involve care discussion time.  VVilinda Boehringer MD [facility] Pager [phone](please enter 7-digits) On Call Pager [phone](please enter 7-digits)  Note: This note was prepared with Dragon dictation along with smaller phrase technology. Any transcriptional errors that result from this process are unintentional.

## 2015-12-13 NOTE — Progress Notes (Signed)
Update: Discussed NGT placement with Dr. Allen Norris (GI), he stated that it was okay to place an NGT/OGT (findings on the EGD were benign).    Vilinda Boehringer, MD [place] [facility] Pager [phone] (please enter 7-digits) On Call Pager - [phone] (please enter 7-digits)

## 2015-12-13 NOTE — Plan of Care (Signed)
MD notified of the critical lactic result of 3.1

## 2015-12-13 NOTE — Consult Note (Signed)
Gregory Livingston  CARDIOLOGY CONSULT NOTE  Patient ID: Gregory Livingston MRN: [id number] DOB/AGE: Sep 15, 1945 70 y.o.  Admit date: 12/26/2015 Referring Physician Dr. Stevenson Livingston Primary Physician   Primary Cardiologist   Reason for Consultation s/p vt arrest  HPI: Pt with pvd and esrd on hd admitted with sepsis. Suffered a vt arrest. Currently intubated and requiring pressors for hemodynamic stability. Echo was read as showing preserved lv function.  Review of Systems  Unable to perform ROS: Patient unresponsive    Past Medical History:  Diagnosis Date  . AAA (abdominal aortic aneurysm) ([place])   . CHF (congestive heart failure) ([place])   . Diabetes mellitus without complication ([place])   . Dialysis patient ([place])   . Hyperparathyroidism ([place])   . Hypertension   . Multiple myeloma ([place])   . Peptic ulcer with perforation ([place])   . PVD (peripheral vascular disease) ([facility])     Family History  Problem Relation Age of Onset  . Hypertension Other     Social History   Social History  . Marital status: Unknown    Spouse name: N/A  . Number of children: N/A  . Years of education: N/A   Occupational History  . retired    Social History Main Topics  . Smoking status: Former Research scientist (life sciences)  . Smokeless tobacco: Never Used  . Alcohol use Yes  . Drug use: No  . Sexual activity: Not on file   Other Topics Concern  . Not on file   Social History Narrative  . No narrative on file    Past Surgical History:  Procedure Laterality Date  . AV FISTULA PLACEMENT    . BELOW KNEE LEG AMPUTATION Bilateral   . bilateral below knee amputation    . ESOPHAGOGASTRODUODENOSCOPY (EGD) WITH PROPOFOL N/A 12/27/2015   Procedure: ESOPHAGOGASTRODUODENOSCOPY (EGD) WITH PROPOFOL;  Surgeon: Wilford Corner, MD;  Location: [facility] ENDOSCOPY;  Service: Endoscopy;  Laterality: N/A;  . PACEMAKER PLACEMENT       Prescriptions Prior to Admission  Medication Sig Dispense Refill Last Dose  .  acetaminophen (TYLENOL) 325 MG tablet Take 975 mg by mouth every 8 (eight) hours as needed.   12/04/2015 at [place]  . albuterol (PROVENTIL HFA;VENTOLIN HFA) 108 (90 Base) MCG/ACT inhaler Inhale 2 puffs into the lungs every 6 (six) hours as needed for shortness of breath.   12/08/2015 at Unknown time  . atorvastatin (LIPITOR) 40 MG tablet Take 40 mg by mouth at bedtime.   12/30/2015 at Unknown time  . B Complex-C-Folic Acid (RENAL VITAMIN PO) Take 1 tablet by mouth daily.   12/22/2015 at Unknown time  . bacitracin 500 UNIT/GM ointment Apply 1 application topically daily. Apply small amount topically to left knee abrasion daily   12/06/2015 at Unknown time  . brimonidine (ALPHAGAN) 0.2 % ophthalmic solution Place 1 drop into the left eye 3 (three) times daily.   12/03/2015 at Unknown time  . calcium-vitamin D (OSCAL WITH D) 500-200 MG-UNIT tablet Take 1 tablet by mouth 2 (two) times daily.   12/30/2015 at Unknown time  . carvedilol (COREG) 25 MG tablet Take 12.5 mg by mouth every 12 (twelve) hours.    12/08/2015 at Unknown time  . cinacalcet (SENSIPAR) 60 MG tablet Take 120 mg by mouth daily. Take 120 mg every evening with food.   12/26/2015 at Unknown time  . Difluprednate (DUREZOL) 0.05 % EMUL Place 1 drop into both eyes daily.   12/04/2015 at Unknown time  . doxycycline (VIBRAMYCIN) 100  MG capsule Take 100 mg by mouth 2 (two) times daily.   12/13/2015 at Unknown time  . folic acid (FOLVITE) 1 MG tablet Take 2 mg by mouth daily.   12/28/2015 at Unknown time  . gabapentin (NEURONTIN) 100 MG capsule Take 200 mg by mouth at bedtime.   12/21/2015 at Unknown time  . hydroxypropyl methylcellulose / hypromellose (ISOPTO TEARS / GONIOVISC) 2.5 % ophthalmic solution Place 1 drop into both eyes 4 (four) times daily as needed for dry eyes.   12/17/2015 at Unknown time  . isosorbide mononitrate (IMDUR) 120 MG 24 hr tablet Take 1 tablet (120 mg total) by mouth daily. (Patient taking differently: Take 240 mg by mouth daily. ) 30  tablet 0 12/06/2015 at Unknown time  . nitroGLYCERIN (NITROSTAT) 0.4 MG SL tablet Place 0.4 mg under the tongue every 5 (five) minutes as needed for chest pain.   prn at prn  . Nutritional Supplements (FEEDING SUPPLEMENT, NEPRO CARB STEADY,) LIQD Take 237 mLs by mouth 3 (three) times daily.   12/25/2015 at Unknown time  . oxyCODONE (OXY IR/ROXICODONE) 5 MG immediate release tablet Take 5 mg by mouth every 4 (four) hours as needed for severe pain.   12/19/2015 at Unknown time  . pantoprazole (PROTONIX) 40 MG tablet Take 40 mg by mouth 2 (two) times daily.   12/20/2015 at Unknown time  . ranolazine (RANEXA) 500 MG 12 hr tablet Take 500 mg by mouth 2 (two) times daily.   12/06/2015 at Unknown time  . senna (SENOKOT) 8.6 MG TABS tablet Take 2 tablets by mouth daily.   12/21/2015 at Unknown time  . sevelamer carbonate (RENVELA) 800 MG tablet Take 2,400 mg by mouth 3 (three) times daily with meals.   12/10/2015 at Unknown time  . aspirin 81 MG chewable tablet Chew 81 mg by mouth daily.   Not Taking at Unknown time  . clopidogrel (PLAVIX) 75 MG tablet Take 75 mg by mouth daily.   Not Taking at Unknown time  . Heparin Sodium, Porcine, PF 5000 UNIT/0.5ML SOLN Inject 0.5 mLs as directed every 8 (eight) hours.   Not Taking at Unknown time    Physical Exam: Blood pressure (!) 34/13, pulse 67, temperature 99.9 F (37.7 C), temperature source Oral, resp. rate 20, height 5' 1"  (1.549 m), weight 202 lb 2.6 oz (91.7 kg), SpO2 98 %.   Wt Readings from Last 1 Encounters:  12/13/15 202 lb 2.6 oz (91.7 kg)     General appearance: intubated and unresponsive Resp: diminished breath sounds bibasilar Cardio: regular rate and rhythm Extremities: s/p bilateral aka Neurologic: Grossly normal  Labs:   Lab Results  Component Value Date   WBC 34.5 (H) 12/13/2015   HGB 7.9 (L) 12/13/2015   HCT 23.8 (L) 12/13/2015   MCV 79.8 (L) 12/13/2015   PLT 95 (L) 12/13/2015    Recent Labs Lab 12/13/15 1025  NA 133*  K 4.1   CL 103  CO2 22  BUN 45*  CREATININE 3.66*  CALCIUM 9.1  GLUCOSE 189*   Lab Results  Component Value Date   TROPONINI 0.09 ([place]) 11/28/2015      Radiology: stable cardiomegaly EKG: sr with no ischemia  ASSESSMENT AND PLAN:  70 yo male with miltiple medical problems including pvd s/p bilateral aka, history of endstage renal disease on chronic hd who was admitted with sepsis felt to be secondary to a sacral decubitus uler. He was noted ot have laarge bloody stools and required transfusion of 3 units  prbcs. Pt was noted to have a gastric uler with bedside upper gi endoscopy. Post procedure he became unresponsive and suffered a vt arrest. After 10 minutes of resuscitation he recovered nsr. He remains hypotensive requiring pressors. Etiology of vt is uncoear. Electrolytes were unremarkable. Echo was read as showing preserved lv function. Continue amiodarone and follow hemodynamics. Keep mg greater than 2 and k greater than 4 Signed: Teodoro Spray MD, [facility] 12/13/2015, 1:23 PM

## 2015-12-13 NOTE — Progress Notes (Addendum)
Pharmacy Antibiotic Note  Gregory Livingston is a 70 y.o. male admitted on 12/14/2015 with sepsis.  Pharmacy has been consulted for Zosyn and vancomycin dosing.  Plan: 1. Zosyn 3.375 gm IV Q12H EI 2. Vancomycin 1 gm IV x 1 in ED, will give additional 500 mg IV x 1 for total load of 1.5 gm, then vancomycin 1 gm IV Q-dialysis MWF. Will check random vanc level 12/15 at 0600.    12/10-  Pt starting CRRT-  Will adjust Vancomycin to 1 gram IV q24h. Will adjust to EI Zosyn to 3.375gm IV q8h.  12/12 -  Pt was unable to continue with CRRT due to problem with dialysis catheter.  Vanc level @ 14:30 = 29 mcg/mL ; elevated level reflects lack of CRRT.   Pt did begin intermittent HD today and will be continued on HD every T-Th-Sat.   Will order Vanc 1 gm IV Q T-Th-Sat HD with next dose to be given on 12/12 @ 18:00 (pt has already completed HD 12/12).   Will draw next Vanc trough 30 minutes before 3rd HD session scheduled for 12/16.      Height: 5\' 1"  (154.9 cm) Weight: 196 lb 3.4 oz (89 kg) IBW/kg (Calculated) : 52.3  Temp (24hrs), Avg:101.1 F (38.4 C), Min:99.8 F (37.7 C), Max:103.5 F (39.7 C)   Recent Labs Lab 12/10/15 0044  12/25/2015 1852 12/10/2015 2055 12/12/15 0447  12/12/15 1301  12/12/15 1826 12/12/15 2227 12/13/15 0415 12/13/15 0641 12/13/15 1025 12/13/15 1423  WBC  --   < > 45.1* 44.0* 53.0*  --   --   --  39.9*  --  34.5*  --   --   --   CREATININE  --   < >  --  3.85*  --   < >  --   < > 3.33*  3.42* 3.47* 3.57* 3.59* 3.66* 3.66*  LATICACIDVEN 1.9  --   --  4.7* 6.9*  --  3.5*  --   --   --   --   --  3.1*  --   VANCORANDOM  --   --   --   --   --   --   --   --   --   --   --   --   --  29  < > = values in this interval not displayed.  Estimated Creatinine Clearance: 17.3 mL/min (by C-G formula based on SCr of 3.66 mg/dL (H)).    Allergies  Allergen Reactions  . Ivp Dye [Iodinated Diagnostic Agents]     Thank you for allowing pharmacy to be a part of this patient's  care.  Yaacov Koziol D, Pharm.D Clinical Pharmacist 12/13/2015 5:28 PM

## 2015-12-13 NOTE — Progress Notes (Signed)
HD completed. Patient tolerated gentle treatment. 0 UF per order. Report given to Cherlynn Perches RN

## 2015-12-13 NOTE — Plan of Care (Addendum)
[facility] called to [facility], and nurse relayed to them the code blue they just had on the pt, and they declined to take the pt, due to being unstable.

## 2015-12-13 NOTE — Progress Notes (Signed)
NT reported unreadable low BG on pt fingerstick, after 2 attempts, RN drew blood off line, used on glucometer, reading 277. NP made aware, verbal order to draw and send down to lab, drew and sent down to lab, result of BG 152 from lab. NP verbal order to cover the 152 result because it would be the most accurate reading. Covered with 3 units using sliding scale order. Will try to get 12 am BG with glucometer, if issue persist, NP order to draw and send to lab each check.  Also family at beside having several questions, and concerned of new site of missing skin on pt finger. NP made aware, came and spoke with family, verbal order to dress wound on finger, and skin tear from previous blister.

## 2015-12-13 NOTE — Progress Notes (Signed)
Pre dialysis assessment 

## 2015-12-13 NOTE — Progress Notes (Signed)
Nutrition Follow-up  DOCUMENTATION CODES:   Obesity unspecified  INTERVENTION:  -MD Mungal discussed NG/OG placement with MD Allen Norris, per MD Allen Norris ok to place tube for meds/feedings. Recommend starting Vital High Protein at 20 ml/hr today, goal is 50 ml/hr providing 1200 kcals, 106 g of protein and 1008 mL of free water. Continue to assess  NUTRITION DIAGNOSIS:   Inadequate oral intake related to inability to eat as evidenced by NPO status.  Being addressed via TF  GOAL:   Provide needs based on ASPEN/SCCM guidelines  MONITOR:   Vent status, Labs, Weight trends, I & O's, Skin, TF tolerance  REASON FOR ASSESSMENT:   Ventilator, Low Braden    ASSESSMENT:   70 y.o. male with a known history of Abdominal aortic aneurysm, diabetes mellitus type 2, end-stage renal disease on dialysis, hypertension, hyperparathyroidism, multiple myeloma, peptic ulcer disease, peripheral vascular disease is a patient of [facility]. Patient presented from [facility] to ED with hypotension. Patient found to have ARF with severe respiratory acidosis, cardiac arrest after upper GI endoscopy, Acute GI bleed (has resolved now s/p endoscopy), severe sepsis from infected decubitus ulcer.  Pt remains on vent support, pt s/p code blue again yesterday Continues without OG or NG Levophed down to 28 mcg/min, vasopressin Labs: sodium 133, Creatinine 3.66 Meds: reviewed  Diet Order:  Diet NPO time specified  Skin:  Wound (see comment) (US/Full Thickness to buttocks, Stg II to sacrum)  Last BM:  12/04/2015  Height:   Ht Readings from Last 1 Encounters:  12/08/2015 5' 1"  (1.549 m)    Weight:   Wt Readings from Last 1 Encounters:  12/13/15 202 lb 2.6 oz (91.7 kg)    Ideal Body Weight:  50.9 kg  BMI:  Body mass index is 38.2 kg/m.  Estimated Nutritional Needs:   Kcal:  [phone] (11-14 kcal/kg)  Protein:  >/= 102 grams (2 grams/kg IBW)  Fluid:  >/= 1.2 L/day or per MD in setting of  ESRD on CRRT  EDUCATION NEEDS:   Education needs no appropriate at this time  [place], Rebecca, [place] [phone] Pager  [phone] Weekend/On-Call Pager

## 2015-12-13 NOTE — Progress Notes (Signed)
Pt stable with no acute distress noted.  Cardiac rhythum continues to be irregular with intermittent pacing noted.  Pt not responding, and has no purposeful movement.

## 2015-12-13 NOTE — Progress Notes (Signed)
Pre dialysis  

## 2015-12-13 NOTE — Progress Notes (Signed)
[facility]  ROUNDING NOTE   Subjective:   12/8- Patient presented from nursing home via EMS for hypotension. Found to have large decubitus ulcer 12/10- Blood per rectum, transfused 3 units PRBC, EGD- Grade D reflux esophagitis,severe ulcerative esophagitis,  gastric ulcer, intubated, CRRT started   12/11- CRRT paused due to clot in venous line. Could not be restarted  coded last night.CPR done Remains on Nor EPI and Vasopressin  Objective:  Vital signs in last 24 hours:  Temp:  [97 F (36.1 C)-103.5 F (39.7 C)] 99.9 F (37.7 C) (12/12 0850) Pulse Rate:  [67-88] 76 (12/12 1000) Resp:  [0-21] 14 (12/12 1000) BP: (53-130)/(12-84) 101/12 (12/12 1000) SpO2:  [86 %-100 %] 99 % (12/12 1000) Arterial Line BP: (116-189)/(36-51) 162/49 (12/12 1000) FiO2 (%):  [40 %-45 %] 40 % (12/12 1000) Weight:  [91.7 kg (202 lb 2.6 oz)] 91.7 kg (202 lb 2.6 oz) (12/12 0414)  Weight change: -5.4 kg (-11 lb 14.5 oz) Filed Weights   12/12/15 0000 12/12/15 0339 12/13/15 0414  Weight: 97.1 kg (214 lb 1.1 oz) 97.1 kg (214 lb 1.1 oz) 91.7 kg (202 lb 2.6 oz)    Intake/Output: I/O last 3 completed shifts: In: 4885.6 [I.V.:3535.6; IV Piggyback:1350] Out: 0    Intake/Output this shift:  Total I/O In: 40 [I.V.:40] Out: -   Physical Exam: General: No acute distress  Head: Normocephalic, atraumatic. Moist oral mucosal membranes  Eyes: Anicteric  Neck: Supple, trachea midline  Lungs:  Ventilator dependent  Heart: regular  Abdomen:  Soft, nontender, obese  Extremities: Bilateral BKA  Neurologic: Sedated, intubated  Skin: No lesions  Access: RUE AVF - ulcerations - covered with bandage    Basic Metabolic Panel:  Recent Labs Lab 12/12/15 1826 12/12/15 2227 12/13/15 0415 12/13/15 0641 12/13/15 1025  NA 132*  131* 133* 133* 132* 133*  K 5.1  5.0 4.2 4.2 4.3 4.1  CL 101  101 103 103 102 103  CO2 20*  20* 20* 22 21* 22  GLUCOSE 289*  294* 216* 199* 182* 189*  BUN 38*  38*  40* 42* 43* 45*  CREATININE 3.33*  3.42* 3.47* 3.57* 3.59* 3.66*  CALCIUM 8.8*  8.9 9.2 9.1 9.1 9.1  MG 1.8 1.8 1.9 1.8 1.8  PHOS 3.6 3.3 3.2 3.0 3.1    Liver Function Tests:  Recent Labs Lab 12/12/15 1826 12/12/15 2227 12/13/15 0415 12/13/15 0641 12/13/15 1025  ALBUMIN 1.1* 1.3* 1.4* 1.3* 1.3*   No results for input(s): LIPASE, AMYLASE in the last 168 hours. No results for input(s): AMMONIA in the last 168 hours.  CBC:  Recent Labs Lab 12/07/2015 2315  12/21/2015 1852 12/24/2015 2055 12/12/15 0447 12/12/15 1826 12/13/15 0415  WBC 21.7*  < > 45.1* 44.0* 53.0* 39.9* 34.5*  NEUTROABS 19.4*  --   --   --   --   --  26.2*  HGB 8.7*  < > 10.1* 9.8* 9.9* 7.3* 7.9*  HCT 27.0*  < > 31.3* 29.6* 31.5* 23.6* 23.8*  MCV 79.7*  < > 82.8 80.1 82.8 81.8 79.8*  PLT 126*  < > 122* 134* 140* 95* 95*  < > = values in this interval not displayed.  Cardiac Enzymes: No results for input(s): CKTOTAL, CKMB, CKMBINDEX, TROPONINI in the last 168 hours.  BNP: Invalid input(s): POCBNP  CBG:  Recent Labs Lab 12/13/15 0002 12/13/15 0400 12/13/15 0741 12/13/15 0846 12/13/15 1146  GLUCAP 196* 173* 65 213* 163*    Microbiology: Results for orders placed  or performed during the hospital encounter of 12/13/2015  Blood culture (routine x 2)     Status: None (Preliminary result)   Collection Time: 12/08/2015 11:15 PM  Result Value Ref Range Status   Specimen Description BLOOD RIGHT WRIST  Final   Special Requests BOTTLES DRAWN AEROBIC AND ANAEROBIC 6CCAERO,5CCANA  Final   Culture NO GROWTH 4 DAYS  Final   Report Status PENDING  Incomplete  Blood culture (routine x 2)     Status: None (Preliminary result)   Collection Time: 12/28/2015 11:31 PM  Result Value Ref Range Status   Specimen Description BLOOD LEFT FOREARM  Final   Special Requests BOTTLES DRAWN AEROBIC AND ANAEROBIC [place]  Final   Culture NO GROWTH 3 DAYS  Final   Report Status PENDING  Incomplete  Aerobic/Anaerobic  Culture (surgical/deep wound)     Status: None (Preliminary result)   Collection Time: 12/10/15 10:09 AM  Result Value Ref Range Status   Specimen Description DECUBITIS  Final   Special Requests Immunocompromised  Final   Gram Stain   Final    DEGENERATED CELLULAR MATERIAL PRESENT FEW SQUAMOUS EPITHELIAL CELLS PRESENT ABUNDANT GRAM NEGATIVE RODS MODERATE GRAM POSITIVE RODS FEW GRAM POSITIVE COCCI IN PAIRS Performed at [facility]    Culture   Final    ABUNDANT PROTEUS MIRABILIS MODERATE ESCHERICHIA COLI NO ANAEROBES ISOLATED; CULTURE IN PROGRESS FOR 5 DAYS    Report Status PENDING  Incomplete   Organism ID, Bacteria PROTEUS MIRABILIS  Final   Organism ID, Bacteria ESCHERICHIA COLI  Final      Susceptibility   Escherichia coli - MIC*    AMPICILLIN 8 SENSITIVE Sensitive     CEFAZOLIN <=4 SENSITIVE Sensitive     CEFEPIME <=1 SENSITIVE Sensitive     CEFTAZIDIME <=1 SENSITIVE Sensitive     CEFTRIAXONE <=1 SENSITIVE Sensitive     CIPROFLOXACIN <=0.25 SENSITIVE Sensitive     GENTAMICIN <=1 SENSITIVE Sensitive     IMIPENEM <=0.25 SENSITIVE Sensitive     TRIMETH/SULFA <=20 SENSITIVE Sensitive     AMPICILLIN/SULBACTAM 4 SENSITIVE Sensitive     PIP/TAZO <=4 SENSITIVE Sensitive     Extended ESBL NEGATIVE Sensitive     * MODERATE ESCHERICHIA COLI   Proteus mirabilis - MIC*    AMPICILLIN <=2 SENSITIVE Sensitive     CEFAZOLIN <=4 SENSITIVE Sensitive     CEFEPIME <=1 SENSITIVE Sensitive     CEFTAZIDIME <=1 SENSITIVE Sensitive     CEFTRIAXONE <=1 SENSITIVE Sensitive     CIPROFLOXACIN >=4 RESISTANT Resistant     GENTAMICIN <=1 SENSITIVE Sensitive     IMIPENEM 0.5 SENSITIVE Sensitive     TRIMETH/SULFA >=320 RESISTANT Resistant     AMPICILLIN/SULBACTAM <=2 SENSITIVE Sensitive     PIP/TAZO <=4 SENSITIVE Sensitive     * ABUNDANT PROTEUS MIRABILIS  Aerobic Culture (superficial specimen)     Status: None   Collection Time: 12/10/15  1:34 PM  Result Value Ref Range Status    Specimen Description HEMODIALYSIS FISTULA  Final   Special Requests LEFT AVF  Final   Gram Stain   Final    MODERATE WBC PRESENT,BOTH PMN AND MONONUCLEAR NO ORGANISMS SEEN    Culture   Final    NO GROWTH 2 DAYS Performed at [facility]    Report Status 12/13/2015 FINAL  Final  Culture, blood (routine x 2)     Status: None (Preliminary result)   Collection Time: 12/10/15  3:24 PM  Result Value Ref Range Status  Specimen Description BLOOD LEFT HAND  Final   Special Requests   Final    BOTTLES DRAWN AEROBIC AND ANAEROBIC  AER 6CC ANA [place]   Culture NO GROWTH 3 DAYS  Final   Report Status PENDING  Incomplete  Culture, blood (routine x 2)     Status: None (Preliminary result)   Collection Time: 12/10/15  3:33 PM  Result Value Ref Range Status   Specimen Description BLOOD RIGHT HAND  Final   Special Requests BOTTLES DRAWN AEROBIC ONLY  2CC  Final   Culture NO GROWTH 3 DAYS  Final   Report Status PENDING  Incomplete  MRSA PCR Screening     Status: None   Collection Time: 12/20/2015  2:23 PM  Result Value Ref Range Status   MRSA by PCR NEGATIVE NEGATIVE Final    Comment:        The GeneXpert MRSA Assay (FDA approved for NASAL specimens only), is one component of a comprehensive MRSA colonization surveillance program. It is not intended to diagnose MRSA infection nor to guide or monitor treatment for MRSA infections.   Culture, expectorated sputum-assessment     Status: None   Collection Time: 12/12/15  6:48 AM  Result Value Ref Range Status   Specimen Description TRACHEAL ASPIRATE  Final   Special Requests NONE  Final   Sputum evaluation   Final    TEST NOT PERFORMED ON TRACH ASP. CREDITED, ORDERED RESP CULTURE SDR 12/12/15    Report Status 12/12/2015 FINAL  Final  Culture, respiratory (NON-Expectorated)     Status: None (Preliminary result)   Collection Time: 12/12/15  6:48 AM  Result Value Ref Range Status   Specimen Description TRACHEAL ASPIRATE  Final   Special  Requests NONE  Final   Gram Stain   Final    NO WBC SEEN RARE HYPHAL ELEMENTS SEEN Performed at [facility]  Final   Report Status PENDING  Incomplete    Coagulation Studies:  Recent Labs  12/08/2015 1613 12/14/2015 2055 12/12/15 0832  LABPROT 14.8 30.7* 17.9*  INR 1.15 2.87 1.46    Urinalysis: No results for input(s): COLORURINE, LABSPEC, PHURINE, GLUCOSEU, HGBUR, BILIRUBINUR, KETONESUR, PROTEINUR, UROBILINOGEN, NITRITE, LEUKOCYTESUR in the last 72 hours.  Invalid input(s): APPERANCEUR    Imaging: Ct Head Wo Contrast  Result Date: 12/13/2015 CLINICAL DATA:  Hypotension, possible sepsis, elevated white blood cell count, blood per rectum EXAM: CT HEAD WITHOUT CONTRAST TECHNIQUE: Contiguous axial images were obtained from the base of the skull through the vertex without intravenous contrast. COMPARISON:  CT brain scan of 11/28/2015 FINDINGS: Brain: The flattening of the spinal cord at the C1-2 level is again noted. The ventricular system remains prominent, as are the cortical sulci diffusely, consistent with atrophy. 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 No hemorrhage, mass lesion, or acute infarction is seen. Vascular: No vascular abnormality is evident on this unenhanced study. Skull: On bone window images, no calvarial abnormality is noted. Sinuses/Orbits: There is fluid and mucosal thickening within the left partition of the sphenoid sinus. The remainder of the paranasal sinuses are clear. Other: None IMPRESSION: 1. No acute intracranial abnormality. 2. Stable atrophy and moderate small vessel ischemic change with old left frontal infarct. 3. Air-fluid level within the left maxillary sinus. 4. No change in flattening of the cord at the C1-2 level Electronically Signed   By: Eddie Dibbles  Alvester Chou M.D.   On: 12/13/2015 09:44   Dg Chest Port 1  View  Result Date: 12/13/2015 CLINICAL DATA:  Hypoxia EXAM: PORTABLE CHEST 1 VIEW COMPARISON:  December 12, 2015 FINDINGS: Endotracheal tube tip is 3.4 cm above the carina. Pacemaker leads are attached the right atrium and right ventricle. No pneumothorax. There is mild mid lung atelectasis bilaterally. Lungs elsewhere are clear. Heart is mildly enlarged with pulmonary vascular within normal limits. There is atherosclerotic calcification in the aorta. No adenopathy. IMPRESSION: Endotracheal tube as described without pneumothorax. Stable cardiomegaly. Mildly midlung atelectatic change bilaterally. No frank edema or consolidation. Aortic atherosclerosis. Electronically Signed   By: Lowella Grip III M.D.   On: 12/13/2015 08:38   Portable Chest Xray  Result Date: 12/12/2015 CLINICAL DATA:  Respiratory failure. EXAM: PORTABLE CHEST 1 VIEW COMPARISON:  Cough 10/2015. FINDINGS: Endotracheal tube terminates 3.6 cm above the carina. Pacemaker lead tips are in the right atrium and right ventricle. Defibrillator pad overlies of the medial left chest. Heart size stable. Thoracic aorta is calcified. There may be minimal left perihilar airspace opacification. Difficult to exclude developing left lower lobe consolidation. No definite pleural fluid. No pleural fluid. IMPRESSION: 1. Left perihilar opacification may be due to atelectasis. Developing pneumonia is not excluded. 2. Difficult to exclude developing left lower lobe consolidation. 3.  Aortic atherosclerosis (ICD10-170.0). Electronically Signed   By: Lorin Picket M.D.   On: 12/12/2015 07:17   Dg Chest Port 1 View  Result Date: 12/12/2015 CLINICAL DATA:  Endotracheal tube placement EXAM: PORTABLE CHEST 1 VIEW COMPARISON:  1656 hours on the same day FINDINGS: Slightly low-lying endotracheal tube tip approximately 2.3 cm above the carina. Pullback approximately 1 cm. Endotracheal cuff appears slightly over distended as well. Heart is borderline enlarged.  There is atelectasis at left lung base. External defibrillator device projects over the heart and mediastinum. There is aortic atherosclerosis at the arch. No suspicious osseous abnormality. Cardiac pacing device projects over the left axilla with right atrial and right ventricular leads in place. IMPRESSION: Low-lying endotracheal tube tip position seen 2.3 cm above the carina with slightly over distended endotracheal tube cuff. Pullback approximately 1 cm. No change in cardiomegaly. Aortic atherosclerosis. Electronically Signed   By: Ashley Royalty M.D.   On: 12/26/2015 19:32   Dg Abd Acute W/chest  Result Date: 12/03/2015 CLINICAL DATA:  GI bleed.  Abdominal distension. EXAM: DG ABDOMEN ACUTE W/ 1V CHEST COMPARISON:  Chest radiograph 12/04/2015. CT abdomen and pelvis 11/28/2015. 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 Dual lead pacemaker remains in place. The cardiac silhouette remains mildly enlarged. Aortic atherosclerosis is again seen. The lungs are slightly less well inflated than on the prior study, and there is minimal left basilar atelectasis. No sizable pleural effusion or pneumothorax is identified. A right axillary stent is again seen. There is no evidence of intraperitoneal free air. No dilated loops of bowel are seen to suggest obstruction. A small to moderate amount of stool is present in the proximal colon, with only a small amount of stool more distally. A 5.6 cm abdominal aortic aneurysm is noted, more fully assessed on recent CT. Surgical clips are present in the upper abdomen and pelvis. Lumbar spondylosis is noted. IMPRESSION: 1. Minimal left basilar atelectasis. 2. Nonobstructed bowel gas pattern. 3. Aortic atherosclerosis.  5.6 cm abdominal aortic aneurysm. Electronically Signed   By: Logan Bores M.D.   On: 12/08/2015 17:33     Medications:   . amiodarone 30 mg/hr (12/13/15 1159)  .  epinephrine Stopped (12/12/15 0547)  . fentaNYL infusion INTRAVENOUS Stopped  (12/13/15 0730)  . norepinephrine (LEVOPHED) Adult infusion 29.973 mcg/min (12/13/15 0600)  . pantoprozole (PROTONIX) infusion 8 mg/hr (12/13/15 1152)  . pureflow Stopped (12/12/15 0915)  . vasopressin (PITRESSIN) infusion - *FOR SHOCK* 0.03 Units/min (12/13/15 0600)   . brimonidine  1 drop Left Eye TID  . chlorhexidine gluconate (MEDLINE KIT)  15 mL Mouth Rinse BID  . epoetin (EPOGEN/PROCRIT) injection  10,000 Units Intravenous Q T,Th,Sa-HD  . insulin aspart  0-15 Units Subcutaneous Q4H  . mouth rinse  15 mL Mouth Rinse 10 times per day  . [START ON 01-03-16] pantoprazole  40 mg Intravenous Q12H  . piperacillin-tazobactam (ZOSYN)  IV  3.375 g Intravenous Q8H  . prednisoLONE acetate  2 drop Both Eyes BID  . sodium chloride flush  10-40 mL Intracatheter Q12H  . sodium hypochlorite   Irrigation BID  . vancomycin  1,000 mg Intravenous Q24H   [DISCONTINUED] acetaminophen **OR** acetaminophen, bisacodyl, fentaNYL, heparin, ipratropium-albuterol, midazolam, midazolam, nitroGLYCERIN, [DISCONTINUED] ondansetron **OR** ondansetron (ZOFRAN) IV, polyvinyl alcohol, sodium chloride, sodium chloride flush  Assessment/ Plan:  71 y.o. male with a PMHx of ESRD on HD at the [facility], anemia chronic kidney disease, secondary hyperparathyroidism, aortic aneurysm, bilateral below the knee amputation, peptic ulcer disease  TTS [facility] AVF  1.  ESRD on HD:   - CRRT would Not run due to problem with dialysis cathter - Trial of IHD today  2. Sepsis: source seems to be from infected decubitus ulcer. With hypotension and leukocytosis - abx as per ICU team  3.  Anemia chronic kidney disease: hemoglobin 7.9. With thrombocytopenia - epo with HD treatment.   4.  Secondary hyperparathyroidism: PTH 156 on 11/29/15. Calcium and phosphorus at goal.  - Cinacalcet - sevelamer Phos 3.1   LOS: 3 Kinga Cassar 12/12/201712:10 PM

## 2015-12-13 NOTE — Progress Notes (Signed)
70 year old male with multiple medical issues to include CHF, diabetes mellitus, end-stage renal disease on dialysis, hypertension, multiple myeloma, peptic ulcer perforation approximately 6 weeks ago, peripheral vascular disease requiring bilateral BKA's, AAA who came in from a nursing home with altered mental status fever and was admitted for sepsis. The patient also had a GI bleed at the time with large bloody stools. He did have an endoscopy which revealed multiple ulcers none of which were bleeding but 1 massive ulcer. Of note he has had to code events in the past 48 hours and that she has been able to regain cardiac function however is not following commands or responding to pain at this time. Surgery has been asked to see him for a necrotic sacral decubitus ulcer and as he was recovering from a cardiac arrest did not attempt debridement yesterday.  Vitals:   12/13/15 1702 12/13/15 1706  BP: (!) 185/42 (!) 177/40  Pulse: 65 64  Resp: (!) 25 (!) 31  Temp:     PE:  Gen: NAD Sacrum:  Large area of necrosis covering approximately 20 x 15cm area on sacrum and upper back, Area debrided sharply with a scalpel and hemostat patient did not grimace or appear to show any pain in this area was done at bedside, it was debrided all the way down to the coccyx bone and to the gluteal muscles.   CBC Latest Ref Rng & Units 12/13/2015 12/12/2015 12/12/2015  WBC 3.8 - 10.6 K/uL 34.5(H) 39.9(H) 53.0(HH)  Hemoglobin 13.0 - 18.0 g/dL 7.9(L) 7.3(L) 9.9(L)  Hematocrit 40.0 - 52.0 % 23.8(L) 23.6(L) 31.5(L)  Platelets 150 - 440 K/uL 95(L) 95(L) 140(L)   CMP Latest Ref Rng & Units 12/13/2015 12/13/2015 12/13/2015  Glucose 65 - 99 mg/dL 136(H) 189(H) 182(H)  BUN 6 - 20 mg/dL 44(H) 45(H) 43(H)  Creatinine 0.61 - 1.24 mg/dL 3.66(H) 3.66(H) 3.59(H)  Sodium 135 - 145 mmol/L 133(L) 133(L) 132(L)  Potassium 3.5 - 5.1 mmol/L 4.3 4.1 4.3  Chloride 101 - 111 mmol/L 100(L) 103 102  CO2 22 - 32 mmol/L 22 22 21(L)  Calcium  8.9 - 10.3 mg/dL 9.0 9.1 9.1  Total Protein 6.5 - 8.1 g/dL - - -  Total Bilirubin 0.3 - 1.2 mg/dL - - -  Alkaline Phos 38 - 126 U/L - - -  AST 15 - 41 U/L - - -  ALT 17 - 63 U/L - - -   A/P:  70 year old male with multiple medical issues to include CHF, diabetes mellitus, end-stage renal disease on dialysis, hypertension, multiple myeloma, peptic ulcer perforation approximately 6 weeks ago, peripheral vascular disease requiring bilateral BKA's, AAA who came in from a nursing home with altered mental status fever and was admitted for sepsis.   Patient was doing better this afternoon and able to turn and hold that position for a long time. As he is not responding to pain was able to sharply debride the area at bedside getting a large amount of necrotic tissue off. Continue Dakin's dressings with Kerlix as well as dry gauze and ABD pads over this area twice daily. I will continue to assess and if no more necrotic material over the next few days may be amenable to a wound VAC placement.

## 2015-12-14 DIAGNOSIS — R6521 Severe sepsis with septic shock: Secondary | ICD-10-CM

## 2015-12-14 DIAGNOSIS — G9341 Metabolic encephalopathy: Secondary | ICD-10-CM

## 2015-12-14 LAB — BASIC METABOLIC PANEL WITH GFR
Anion gap: 13 (ref 5–15)
BUN: 44 mg/dL — ABNORMAL HIGH (ref 6–20)
CO2: 20 mmol/L — ABNORMAL LOW (ref 22–32)
Calcium: 9 mg/dL (ref 8.9–10.3)
Chloride: 99 mmol/L — ABNORMAL LOW (ref 101–111)
Creatinine, Ser: 3.58 mg/dL — ABNORMAL HIGH (ref 0.61–1.24)
GFR calc Af Amer: 18 mL/min — ABNORMAL LOW
GFR calc non Af Amer: 16 mL/min — ABNORMAL LOW
Glucose, Bld: 104 mg/dL — ABNORMAL HIGH (ref 65–99)
Potassium: 4.4 mmol/L (ref 3.5–5.1)
Sodium: 132 mmol/L — ABNORMAL LOW (ref 135–145)

## 2015-12-14 LAB — RENAL FUNCTION PANEL
ALBUMIN: 1.2 g/dL — AB (ref 3.5–5.0)
ANION GAP: 13 (ref 5–15)
Albumin: 1.2 g/dL — ABNORMAL LOW (ref 3.5–5.0)
Albumin: 1.2 g/dL — ABNORMAL LOW (ref 3.5–5.0)
Anion gap: 15 (ref 5–15)
Anion gap: 18 — ABNORMAL HIGH (ref 5–15)
BUN: 42 mg/dL — ABNORMAL HIGH (ref 6–20)
BUN: 45 mg/dL — AB (ref 6–20)
BUN: 47 mg/dL — ABNORMAL HIGH (ref 6–20)
CHLORIDE: 102 mmol/L (ref 101–111)
CHLORIDE: 99 mmol/L — AB (ref 101–111)
CO2: 18 mmol/L — AB (ref 22–32)
CO2: 19 mmol/L — ABNORMAL LOW (ref 22–32)
CO2: 14 mmol/L — ABNORMAL LOW (ref 22–32)
CREATININE: 3.32 mg/dL — AB (ref 0.61–1.24)
Calcium: 8.7 mg/dL — ABNORMAL LOW (ref 8.9–10.3)
Calcium: 9 mg/dL (ref 8.9–10.3)
Calcium: 9 mg/dL (ref 8.9–10.3)
Chloride: 98 mmol/L — ABNORMAL LOW (ref 101–111)
Creatinine, Ser: 3.54 mg/dL — ABNORMAL HIGH (ref 0.61–1.24)
Creatinine, Ser: 3.79 mg/dL — ABNORMAL HIGH (ref 0.61–1.24)
GFR calc Af Amer: 17 mL/min — ABNORMAL LOW
GFR calc non Af Amer: 15 mL/min — ABNORMAL LOW
GFR, EST AFRICAN AMERICAN: 20 mL/min — AB (ref >60)
GFR, EST AFRICAN AMERICAN: 18 mL/min — AB (ref >60)
GFR, EST NON AFRICAN AMERICAN: 17 mL/min — AB (ref >60)
GFR, EST NON AFRICAN AMERICAN: 16 mL/min — AB (ref >60)
Glucose, Bld: 96 mg/dL (ref 65–99)
Glucose, Bld: 112 mg/dL — ABNORMAL HIGH (ref 65–99)
Glucose, Bld: 200 mg/dL — ABNORMAL HIGH (ref 65–99)
PHOSPHORUS: 3.8 mg/dL (ref 2.5–4.6)
POTASSIUM: 4.4 mmol/L (ref 3.5–5.1)
POTASSIUM: 4.7 mmol/L (ref 3.5–5.1)
Phosphorus: 3.8 mg/dL (ref 2.5–4.6)
Phosphorus: 4.7 mg/dL — ABNORMAL HIGH (ref 2.5–4.6)
Potassium: 5 mmol/L (ref 3.5–5.1)
Sodium: 133 mmol/L — ABNORMAL LOW (ref 135–145)
Sodium: 133 mmol/L — ABNORMAL LOW (ref 135–145)
Sodium: 130 mmol/L — ABNORMAL LOW (ref 135–145)

## 2015-12-14 LAB — CULTURE, BLOOD (ROUTINE X 2): Culture: NO GROWTH

## 2015-12-14 LAB — CBC WITH DIFFERENTIAL/PLATELET
Band Neutrophils: 7 %
Basophils Absolute: 0 K/uL (ref 0–0.1)
Basophils Relative: 0 %
Blasts: 0 %
Eosinophils Absolute: 0 K/uL (ref 0–0.7)
Eosinophils Relative: 0 %
HCT: 19.8 % — ABNORMAL LOW (ref 40.0–52.0)
Hemoglobin: 6.4 g/dL — ABNORMAL LOW (ref 13.0–18.0)
Lymphocytes Relative: 19 %
Lymphs Abs: 7.1 K/uL — ABNORMAL HIGH (ref 1.0–3.6)
MCH: 26.2 pg (ref 26.0–34.0)
MCHC: 32.5 g/dL (ref 32.0–36.0)
MCV: 80.7 fL (ref 80.0–100.0)
Metamyelocytes Relative: 0 %
Monocytes Absolute: 1.5 K/uL — ABNORMAL HIGH (ref 0.2–1.0)
Monocytes Relative: 4 %
Myelocytes: 0 %
Neutro Abs: 28.7 K/uL — ABNORMAL HIGH (ref 1.4–6.5)
Neutrophils Relative %: 70 %
Other: 0 %
Platelets: 78 K/uL — ABNORMAL LOW (ref 150–440)
Promyelocytes Absolute: 0 %
RBC: 2.45 MIL/uL — ABNORMAL LOW (ref 4.40–5.90)
RDW: 18.9 % — ABNORMAL HIGH (ref 11.5–14.5)
WBC: 37.3 K/uL — ABNORMAL HIGH (ref 3.8–10.6)
nRBC: 0 /100{WBCs}

## 2015-12-14 LAB — BLOOD GAS, ARTERIAL
Acid-base deficit: 10.1 mmol/L — ABNORMAL HIGH (ref 0.0–2.0)
BICARBONATE: 14.8 mmol/L — AB (ref 20.0–28.0)
FIO2: 1
MECHVT: 420 mL
O2 Saturation: 99.9 %
PATIENT TEMPERATURE: 38.9
PEEP: 5 cmH2O
PH ART: 7.3 — AB (ref 7.350–7.450)
RATE: 16 resp/min
pCO2 arterial: 28 mmHg — ABNORMAL LOW (ref 32.0–48.0)
pO2, Arterial: 322 mmHg — ABNORMAL HIGH (ref 83.0–108.0)

## 2015-12-14 LAB — GLUCOSE, CAPILLARY
GLUCOSE-CAPILLARY: 57 mg/dL — AB (ref 65–99)
GLUCOSE-CAPILLARY: 202 mg/dL — AB (ref 65–99)
Glucose-Capillary: <10 mg/dL — CL (ref 65–99)
Glucose-Capillary: 64 mg/dL — ABNORMAL LOW (ref 65–99)
Glucose-Capillary: 137 mg/dL — ABNORMAL HIGH (ref 65–99)
Glucose-Capillary: 73 mg/dL (ref 65–99)
Glucose-Capillary: 59 mg/dL — ABNORMAL LOW (ref 65–99)

## 2015-12-14 LAB — GLUCOSE, RANDOM
GLUCOSE: 227 mg/dL — AB (ref 65–99)
Glucose, Bld: 234 mg/dL — ABNORMAL HIGH (ref 65–99)
Glucose, Bld: 178 mg/dL — ABNORMAL HIGH (ref 65–99)

## 2015-12-14 LAB — MAGNESIUM
MAGNESIUM: 1.7 mg/dL (ref 1.7–2.4)
MAGNESIUM: 1.9 mg/dL (ref 1.7–2.4)
Magnesium: 1.9 mg/dL (ref 1.7–2.4)

## 2015-12-14 LAB — CULTURE, RESPIRATORY: GRAM STAIN: NONE SEEN

## 2015-12-14 LAB — HEMOGLOBIN AND HEMATOCRIT, BLOOD
HCT: 23.6 % — ABNORMAL LOW (ref 40.0–52.0)
HEMOGLOBIN: 7.6 g/dL — AB (ref 13.0–18.0)

## 2015-12-14 LAB — CULTURE, RESPIRATORY W GRAM STAIN

## 2015-12-14 LAB — PREPARE RBC (CROSSMATCH)

## 2015-12-14 LAB — APTT: aPTT: 58 seconds — ABNORMAL HIGH (ref 24–36)

## 2015-12-14 MED ORDER — DEXTROSE 50 % IV SOLN
INTRAVENOUS | Status: AC
  Filled 2015-12-14: qty 50

## 2015-12-14 MED ORDER — VITAL HIGH PROTEIN PO LIQD
1000.0000 mL | ORAL | Status: DC
  Administered 2015-12-14 (×2): 1000 mL
  Administered 2015-12-15 (×3)
  Administered 2015-12-15: 1000 mL

## 2015-12-14 MED ORDER — DEXTROSE 50 % IV SOLN
1.0000 | Freq: Once | INTRAVENOUS | Status: AC
  Administered 2015-12-14: 50 mL via INTRAVENOUS

## 2015-12-14 MED ORDER — SODIUM CHLORIDE 0.9 % IV SOLN: Freq: Once | INTRAVENOUS | Status: DC

## 2015-12-14 MED ORDER — PIPERACILLIN-TAZOBACTAM 3.375 G IVPB
3.3750 g | Freq: Two times a day (BID) | INTRAVENOUS | Status: DC
  Administered 2015-12-14 – 2015-12-15 (×2): 3.375 g via INTRAVENOUS
  Filled 2015-12-14 (×2): qty 50

## 2015-12-14 MED ORDER — ALBUMIN HUMAN 25 % IV SOLN
12.5000 g | Freq: Once | INTRAVENOUS | Status: AC
  Administered 2015-12-14: 12.5 g via INTRAVENOUS
  Filled 2015-12-14: qty 50

## 2015-12-14 MED ORDER — DAKINS (1/4 STRENGTH) 0.125 % EX SOLN
Freq: Two times a day (BID) | CUTANEOUS | Status: DC
  Filled 2015-12-14: qty 473

## 2015-12-14 MED ORDER — DEXTROSE 50 % IV SOLN
1.0000 | Freq: Once | INTRAVENOUS | Status: AC
  Administered 2015-12-14: 50 mL via INTRAVENOUS
  Filled 2015-12-14: qty 50

## 2015-12-14 MED ORDER — DAKINS (1/4 STRENGTH) 0.125 % EX SOLN
Freq: Two times a day (BID) | CUTANEOUS | Status: DC
  Administered 2015-12-14: 17:00:00
  Administered 2015-12-15: 1
  Filled 2015-12-14: qty 473

## 2015-12-14 MED ORDER — DAKINS (1/4 STRENGTH) 0.125 % EX SOLN: Freq: Two times a day (BID) | CUTANEOUS | Status: DC

## 2015-12-14 MED ORDER — DEXTROSE 5 % IV SOLN
0.0000 ug/min | INTRAVENOUS | Status: DC
  Filled 2015-12-14: qty 4

## 2015-12-14 MED ORDER — PANTOPRAZOLE SODIUM 40 MG IV SOLR
40.0000 mg | Freq: Two times a day (BID) | INTRAVENOUS | Status: DC
  Administered 2015-12-14 – 2015-12-15 (×3): 40 mg via INTRAVENOUS
  Filled 2015-12-14 (×3): qty 40

## 2015-12-14 NOTE — Progress Notes (Signed)
PULMONARY / CRITICAL CARE MEDICINE   Name: Gregory Livingston MRN: [id number] DOB: June 27, 1945    ADMISSION DATE:  12/17/2015 CONSULTATION DATE:  12/27/2015  REFERRING MD:  Dr. Posey Pronto   CHIEF COMPLAINT:  Cardiac arrest  HISTORY OF PRESENT ILLNESS:   This is a 70 y/o AA male, SNF resident with a significant medical history who presented to the ED via EMS with hypotension. EMS was called because SNF staff were concerned that patient is septic. He was found to have a significantly elevated WBC hence he was admitted and treatment for sepsis was initiated. Possible source was thought to be his sacral decubitus ulcer. 12/18/2015 patient was noted to have a large blood stools that evolved to just bright red blood with multiple clots from the rectum. He was transfused 3 units of packed red blood cells and a bedside upper GI endoscopy was performed. Postprocedure, patient became hypoxic, hypotensive and subsequently went into cardiac arrest. He was resuscitated for approximately 10 minutes with return of spontaneous circulation. PCCM was consulted for further management.  His EGD showed severe ulcerative colitis with a massive the stock clean-based gastric ulcer. Rectal bleeding has improved. The patient continues to be severely hypotensive requiring multiple pressors and IV fluids. He has end-stage renal disease and is due for dialysis. However, given his low blood pressure, Nephrology has decided to proceed with continuous renal replacement therapy   PAST MEDICAL HISTORY :  He  has a past medical history of AAA (abdominal aortic aneurysm) ([place]); CHF (congestive heart failure) ([place]); Diabetes mellitus without complication ([place]); Dialysis patient [facility]); Hyperparathyroidism ([place]); Hypertension; Multiple myeloma ([place]); Peptic ulcer with perforation ([place]); and PVD (peripheral vascular disease) ([place]).  PAST SURGICAL HISTORY: He  has a past surgical history that includes Below knee leg amputation (Bilateral); pacemaker  placement; AV fistula placement; bilateral below knee amputation; and Esophagogastroduodenoscopy (egd) with propofol (N/A, 12/16/2015).  Allergies  Allergen Reactions  . Ivp Dye [Iodinated Diagnostic Agents]     No current facility-administered medications on file prior to encounter.    Current Outpatient Prescriptions on File Prior to Encounter  Medication Sig  . acetaminophen (TYLENOL) 325 MG tablet Take 975 mg by mouth every 8 (eight) hours as needed.  Marland Kitchen albuterol (PROVENTIL HFA;VENTOLIN HFA) 108 (90 Base) MCG/ACT inhaler Inhale 2 puffs into the lungs every 6 (six) hours as needed for shortness of breath.  Marland Kitchen atorvastatin (LIPITOR) 40 MG tablet Take 40 mg by mouth at bedtime.  . B Complex-C-Folic Acid (RENAL VITAMIN PO) Take 1 tablet by mouth daily.  . bacitracin 500 UNIT/GM ointment Apply 1 application topically daily. Apply small amount topically to left knee abrasion daily  . brimonidine (ALPHAGAN) 0.2 % ophthalmic solution Place 1 drop into the left eye 3 (three) times daily.  . calcium-vitamin D (OSCAL WITH D) 500-200 MG-UNIT tablet Take 1 tablet by mouth 2 (two) times daily.  . carvedilol (COREG) 25 MG tablet Take 12.5 mg by mouth every 12 (twelve) hours.   . cinacalcet (SENSIPAR) 60 MG tablet Take 120 mg by mouth daily. Take 120 mg every evening with food.  . Difluprednate (DUREZOL) 0.05 % EMUL Place 1 drop into both eyes daily.  . folic acid (FOLVITE) 1 MG tablet Take 2 mg by mouth daily.  Marland Kitchen gabapentin (NEURONTIN) 100 MG capsule Take 200 mg by mouth at bedtime.  . hydroxypropyl methylcellulose / hypromellose (ISOPTO TEARS / GONIOVISC) 2.5 % ophthalmic solution Place 1 drop into both eyes 4 (four) times daily as needed for dry eyes.  Marland Kitchen  isosorbide mononitrate (IMDUR) 120 MG 24 hr tablet Take 1 tablet (120 mg total) by mouth daily. (Patient taking differently: Take 240 mg by mouth daily. )  . nitroGLYCERIN (NITROSTAT) 0.4 MG SL tablet Place 0.4 mg under the tongue every 5 (five)  minutes as needed for chest pain.  . Nutritional Supplements (FEEDING SUPPLEMENT, NEPRO CARB STEADY,) LIQD Take 237 mLs by mouth 3 (three) times daily.  Marland Kitchen oxyCODONE (OXY IR/ROXICODONE) 5 MG immediate release tablet Take 5 mg by mouth every 4 (four) hours as needed for severe pain.  . pantoprazole (PROTONIX) 40 MG tablet Take 40 mg by mouth 2 (two) times daily.  . ranolazine (RANEXA) 500 MG 12 hr tablet Take 500 mg by mouth 2 (two) times daily.  Marland Kitchen senna (SENOKOT) 8.6 MG TABS tablet Take 2 tablets by mouth daily.  . sevelamer carbonate (RENVELA) 800 MG tablet Take 2,400 mg by mouth 3 (three) times daily with meals.  Marland Kitchen aspirin 81 MG chewable tablet Chew 81 mg by mouth daily.  . clopidogrel (PLAVIX) 75 MG tablet Take 75 mg by mouth daily.  . Heparin Sodium, Porcine, PF 5000 UNIT/0.5ML SOLN Inject 0.5 mLs as directed every 8 (eight) hours.    FAMILY HISTORY:  His indicated that his mother is deceased. He indicated that his father is deceased. He indicated that the status of his other is unknown.    SOCIAL HISTORY: He  reports that he has quit smoking. He has never used smokeless tobacco. He reports that he drinks alcohol. He reports that he does not use drugs.  REVIEW OF SYSTEMS:   Unable to obtain as the patient is intubated and on mechanical ventilation  SUBJECTIVE:  Patient remains intubated and on mechanical ventillation, on pressors.  Was afebrile during night.  Opens eyes spontaneously but does not follow any commands.  Occasionally responds to his name by looking. No major issues overnight.  VITAL SIGNS: BP (!) 117/11   Pulse 60   Temp 98.9 F (37.2 C) (Oral)   Resp (!) 23   Ht _0  (1.549 m)   Wt 92.3 kg (203 lb 7.8 oz)   SpO2 100%   BMI 38.45 kg/m   HEMODYNAMICS: CVP:  [8 mmHg-45 mmHg] 11 mmHg  VENTILATOR SETTINGS: Vent Mode: PRVC FiO2 (%):  [40 %] 40 % Set Rate:  [16 bmp] 16 bmp Vt Set:  [420 mL] 420 mL PEEP:  [5 cmH20] 5 cmH20  INTAKE / OUTPUT: I/O last 3  completed shifts: In: 3056.8 [I.V.:2746.8; NG/GT:60; IV Piggyback:250] Out: 0   PHYSICAL EXAMINATION: General: very sickly appearing AA male, intubated and on mechanical ventilation Neuro: sedated , no purposeful movement, does not follow any commands HEENT:  Atraumatic, normocephalic, PERRLA,Copious oral secretion Cardiovascular:  A-V sequentially placed, no MRG noted Lungs:  Diminished bibasilar,no wheezes,crackles,rhonchi noted Abdomen:  Nondistended, hypoactive bowel sounds x4, soft Musculoskeletal:  Bilateral BKA, 2+ generalized edema.Extremities: +2 pulses in upper extremities,right upper arm fistula with a positive bruits and thrill dressing dry and intact Skin: Large sacral decub LABS:  BMET  Recent Labs Lab 12/13/15 2009 12/13/15 2310 12/14/15 0200  NA 131* 132* 133*  K 4.4 4.7 4.4  CL 99* 100* 102  CO2 20* 18* 18*  BUN 41* 43* 42*  CREATININE 3.31* 3.56* 3.32*  GLUCOSE 152* 115* 96    Electrolytes  Recent Labs Lab 12/13/15 1645 12/13/15 2009 12/13/15 2310 12/14/15 0200  CALCIUM 8.9 8.8* 9.0 8.7*  MG 1.7  --  1.8 1.7  PHOS 3.2  --  4.0 3.8    CBC  Recent Labs Lab 12/12/15 0447 12/12/15 1826 12/13/15 0415  WBC 53.0* 39.9* 34.5*  HGB 9.9* 7.3* 7.9*  HCT 31.5* 23.6* 23.8*  PLT 140* 95* 95*    Coag's  Recent Labs Lab 12/22/2015 1613 12/24/2015 2055 12/12/15 0622 12/12/15 0832 12/13/15 0415  APTT 46* 88* 62*  --  57*  INR 1.15 2.87  --  1.46  --     Sepsis Markers  Recent Labs Lab 12/18/2015 1532  12/12/15 0447 12/12/15 0622 12/12/15 1301 12/13/15 0415 12/13/15 1025  LATICACIDVEN  --   < > 6.9*  --  3.5*  --  3.1*  PROCALCITON 1.65  --   --  5.94  --  49.80  --   < > = values in this interval not displayed.  ABG  Recent Labs Lab 12/20/2015 2000 12/12/15 1503 12/12/15 1830  PHART 7.32* 7.39 7.31*  PCO2ART 32 35 35  PO2ART 279* 133* 129*    Liver Enzymes  Recent Labs Lab 12/13/15 1645 12/13/15 2310 12/14/15 0200  ALBUMIN  1.4* 1.3* 1.2*    Cardiac Enzymes No results for input(s): TROPONINI, PROBNP in the last 168 hours.  Glucose  Recent Labs Lab 12/13/15 1146 12/13/15 1529 12/13/15 1956 12/13/15 2000 12/13/15 2006 12/13/15 2355  GLUCAP 163* 93 <10* 277* 11* <10*    Imaging Dg Abd 1 View  Result Date: 12/13/2015 CLINICAL DATA:  70 year old male enteric tube placement. Initial encounter. EXAM: ABDOMEN - 1 VIEW COMPARISON:  12/14/2015. FINDINGS: Portable AP views at 1215 hours. Enteric tube placed, side hole to level of the proximal gastric body. New pacing or resuscitation pads over the lower chest and right upper abdomen. Partially visible left chest cardiac pacemaker. Stable visible mediastinal contours. Prior CABG. Extensive Aortoiliac calcified atherosclerosis noted. Large infrarenal abdominal aortic aneurysm, diameter up to 56 mm as seen on the recent CT Abdomen and Pelvis 11/28/2015. Bilateral femoral approach vascular catheters in place, that on the right is stable since 12/26/2015. New probably dual lumen catheter on the left tip projecting at the level of the proximal left iliac veins. Stable pelvic surgical clips. Osteopenia. Three small clustered metallic foreign bodies again project over the greater curve of the stomach an or present on the prior CT. Non obstructed bowel gas pattern. IMPRESSION: 1. Enteric tube placed, side hole to level of the proximal stomach. 2. Bilateral pelvic vascular catheters, a new dual-lumen appearing catheter on the left since 12/22/2015 with tip at the level of the proximal iliac veins. 3. Extensive Aortoiliac calcified atherosclerosis with infrarenal abdominal aortic aneurysm, diameter 56 mm on recent CT. 4. Non obstructed bowel gas pattern. Chronic small retained metallic foreign bodies in the distal stomach. Electronically Signed   By: Genevie Ann M.D.   On: 12/13/2015 12:33   Ct Head Wo Contrast  Result Date: 12/13/2015 CLINICAL DATA:  Hypotension, possible sepsis,  elevated white blood cell count, blood per rectum EXAM: CT HEAD WITHOUT CONTRAST TECHNIQUE: Contiguous axial images were obtained from the base of the skull through the vertex without intravenous contrast. COMPARISON:  CT brain scan of 11/28/2015 FINDINGS: Brain: The flattening of the spinal cord at the C1-2 level is again noted. The ventricular system remains prominent, as are the cortical sulci diffusely, consistent with atrophy. 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 No hemorrhage, mass lesion, or acute infarction is seen. Vascular: No vascular abnormality is  evident on this unenhanced study. Skull: On bone window images, no calvarial abnormality is noted. Sinuses/Orbits: There is fluid and mucosal thickening within the left partition of the sphenoid sinus. The remainder of the paranasal sinuses are clear. Other: None IMPRESSION: 1. No acute intracranial abnormality. 2. Stable atrophy and moderate small vessel ischemic change with old left frontal infarct. 3. Air-fluid level within the left maxillary sinus. 4. No change in flattening of the cord at the C1-2 level Electronically Signed   By: Ivar Drape M.D.   On: 12/13/2015 09:44   Dg Chest Port 1 View  Result Date: 12/13/2015 CLINICAL DATA:  Hypoxia EXAM: PORTABLE CHEST 1 VIEW COMPARISON:  December 12, 2015 FINDINGS: Endotracheal tube tip is 3.4 cm above the carina. Pacemaker leads are attached the right atrium and right ventricle. No pneumothorax. There is mild mid lung atelectasis bilaterally. Lungs elsewhere are clear. Heart is mildly enlarged with pulmonary vascular within normal limits. There is atherosclerotic calcification in the aorta. No adenopathy. IMPRESSION: Endotracheal tube as described without pneumothorax. Stable cardiomegaly. Mildly midlung atelectatic change bilaterally. No frank edema or consolidation. Aortic  atherosclerosis. Electronically Signed   By: Lowella Grip III M.D.   On: 12/13/2015 08:38     STUDIES:  2-D Echo12/11>>The cavity size was normal.  The estimated ejection fraction was in the range of 55% to 65% CT Head 12/12>> No acute intracranial abnormality   CULTURES: Blood 12/9 x2>>negative>> Aerobic culture left av fistula 12/9>>negative Aerobic/Anaerobic culture decubitis ulcer 12/9>>abundant proteus mirabilis>> Sputum 12/11>>  ANTIBIOTICS: 12/9 zosyn>> 12/9Vancomycin>>  SIGNIFICANT EVENTS: 12/02/2015: Admitted with sepsis due to sacral decubitus ulcer. 12/23/2015: Severe GI bleed, bedside endoscopy and cardiac arrests post procedure 12/12/2015: Cardiac arrest vtach rhythm ACLS protocol initiated with ROSC 12/11- CRRT paused due to clot in venous line. Could not be restarted 12/12 tolerated a session of HD 12/13 becoming more awake, but not purposeful    LINES/TUBES: Right femoral central intravenous catheter 12/09>> Left femoral A-line 12/09>> Left femoral dialysis catheter 12/10>>  DISCUSSION: This is a 70 year old African-American male with end-stage renal disease on hemodialysis, severe peripheral vascular disease, status post bilateral BKA, peptic ulcer with perforation, multiple myeloma, type 2 diabetes, AAA, and congestive heart failure, presenting with acute GI bleed, septic/hypovolemic shock, sepsis from sacral decubitus ulcer, and severe metabolic acidosis with subsequent respiratory failure. Now in possible DIC with multiorgan failure. Overall prognosis is guarded given multiple comorbidities   ASSESSMENT / PLAN:  PULMONARY A: Acute respiratory failure  P:   Continue full vent support with current settings Chest x-ray and ABG [place] Nebulized bronchodilators VAP Protocol  CARDIOVASCULAR A:  Cardiac arrest , status posts upper GI endoscopy 12/10 Vtach cardiac arrest 12/11 Hypotension secondary to shock-initially patient wasn't septic  and hypovolemic shock, but there could be a component of cardiogenic shock as well Hx:  Congestive heart failure, Hypertension, AAA, and Peripheral vascular disease P:  Hemodynamic monitoring per ICU protocol Continue Norepinephrine/vasopressin Keep MAP goals >65 CVP monitoring every 4 hours Cardiology consulted appreciate input>etiology of vtach is unclear, keep K>4, Mg>2  RENAL A:   End-stage renal disease on hemodialysis P:   Nephrology consulted appreciate input Unable to continue CRRT on 12/11 due to vascular access problems per Nephrology will reassess today 12/12 to determine if pt can tolerate hemodialysis  Monitor and correct electrolytes  GASTROINTESTINAL A:   Acute GI bleed, status posts. Upper GI endoscopy-rectal bleeding has subsided Ulcerative Esophagitis P:   GI consulted appreciate input Wean protonix gtt, now protonix  BID Monitor hemoglobin and hematocrit and transfuse per protocol Monitor for further rectal bleeding Discussed case with GI, okay to place NGT  HEMATOLOGIC A:   Acute blood loss anemia Possible DIC P:  Trend CBC and transfuse for hemoglobin less than 7 with active bleeding Hold chemical VTE prophylaxis, SCD's for VTE prophylaxis Monitor PT INR and APTT  Monitor for further bleeding  INFECTIOUS A:   Leukocytosis remains elevated but trending down Severe sepsis from infected decubitus ulcer  Questionable intra-abdominal infection P:   Continue abx as listed above Monitor fever curve Follow cultures Wound dressing as per wound nurse Surgery debrided the sacral ulcer 12/12>large amount of foul necrotic tissue removed, continue with Dakin's dresssing  ENDOCRINE A:   Hypoglycemia-resolved  Hx: Type II diabetes  P:   CBG's q4hrs   NEUROLOGIC A:   Acute metabolic encephalopathy secondary to sepsis P:   RASS goal: 0 to -1 Fentanyl and Versed for sedation. CT head Negative 12/12 WUA daily Frequent reorientation Promote  family presence at bedside  Bincy Varughese,AG-ACNP Pulmonary and [place]   12/14/2015, 4:15 AM  STAFF NOTE: I, Dr. Vilinda Boehringer have personally reviewed patient's available data, including medical history, events of note, physical examination and test results as part of my evaluation. I have discussed with NP Varughese  and other care providers such as pharmacist, RN and RRT.   S: had an episode of fever overnight, tolerated a session of HD yesterday.   A:  Cardiac arrest , status posts upper GI endoscopy 12/10 Vtach cardiac arrest 12/11 Hypotension secondary to shock-initially patient wasn't septic and hypovolemic shock, but there could be a component of cardiogenic shock as well Hx:  Congestive heart failure, Hypertension, AAA, and Peripheral vascular disease Acute GI bleed, status posts. Upper GI endoscopy-rectal bleeding has subsided Ulcerative Esophagitis Acute blood loss anemia Possible DIC Leukocytosis remains elevated but trending down Severe sepsis from infected decubitus ulcer  Questionable intra-abdominal infection Hypoglycemia-resolved  Hx: Type II diabetes  P:  Overall with stable clinical status over the past 36 hours, but still critically ill. Neurologically with a mild gag reflex today, and starting to open eyes and trying to track. Septic source is most likely the sacral decubitus (unstageable), however with surgical debridement along with antibiotics, should be improving clinical status over the next few days.  Appreciate assistance from Surgery, Nephrology .  Daughter, again, is requesting transfer to [facility]. Patient is more stable now, and appropriate to reinitiate transfer process.    Marland Kitchen  Rest per NP/medical resident whose note is outlined above and that I agree with  The patient is critically ill with multiple organ systems failure and requires high complexity decision making for assessment and support, frequent  evaluation and titration of therapies, application of advanced monitoring technologies and extensive interpretation of multiple databases.   Critical Care Time devoted to patient care services described in this note is  45 Minutes.   This time reflects time of care of this signee Dr Vilinda Boehringer.  This critical care time does not reflect procedure time, or teaching time or supervisory time of PA/NP/Med-student/Med Resident etc but could involve care discussion time.  Vilinda Boehringer, MD [facility] Pager [phone] (please enter 7-digits) On Call Pager [phone] (please enter 7-digits)  Note: This note was prepared with Dragon dictation along with smaller phrase technology. Any transcriptional errors that result from this process are unintentional.

## 2015-12-14 NOTE — Progress Notes (Signed)
Post dialysis 

## 2015-12-14 NOTE — Progress Notes (Signed)
[facility] Progress Note Patient Name: Gregory Livingston DOB: August 30, 1945 MRN: [id number]   Date of Service  12/14/2015  HPI/Events of Note  Hgb 6.4  eICU Interventions  One unit PRBCs     Intervention Category Major Interventions: Hemorrhage - evaluation and management  Wilhelmina Mcardle 12/14/2015, 6:23 AM

## 2015-12-14 NOTE — Care Management (Signed)
I have faxed new consent for transfer to  [facility]. Message left for [facility] of fax.

## 2015-12-14 NOTE — Progress Notes (Signed)
Notified by Velna Hatchet, RN pt continues to spike temps tmax 103 F despite administration of Tylenol instructed RN to place ice packs on the groin and under the axilla.  Will continue to monitor and assess pt.  Marda Stalker, [place] Pager [phone] (please enter 7 digits) PCCM Consult Pager [phone] (please enter 7 digits)

## 2015-12-14 NOTE — Progress Notes (Signed)
[facility]  ROUNDING NOTE   Subjective:   12/8- Patient presented from nursing home via EMS for hypotension. Found to have large decubitus ulcer 12/10- Blood per rectum, transfused 3 units PRBC, EGD- Grade D reflux esophagitis,severe ulcerative esophagitis,  gastric ulcer, intubated, CRRT started   12/11- CRRT paused due to clot in venous line. Could not be restarted  112/11- coded at night.CPR done 12-19-22- intermittent HD - tolerated OK. BFR 200 via catheter Remains on Nor EPI and Vasopressin  Objective:  Vital signs in last 24 hours:  Temp:  [98.6 F (37 C)-103 F (39.4 C)] 101 F (38.3 C) (12/13 1452) Pulse Rate:  [57-98] 61 (12/13 1452) Resp:  [13-31] 27 (12/13 1452) BP: (49-194)/(11-84) 103/37 (12/13 1400) SpO2:  [90 %-100 %] 98 % (12/13 1452) Arterial Line BP: (148-198)/(38-47) 169/41 (12/13 1452) FiO2 (%):  [40 %] 40 % (12/13 1136) Weight:  [89 kg (196 lb 3.4 oz)-92.3 kg (203 lb 7.8 oz)] 92.3 kg (203 lb 7.8 oz) (12/13 0358)  Weight change: -2.8 kg (-6 lb 2.8 oz) Filed Weights   2015-12-19 1423 Dec 19, 2015 1659 12/14/15 0358  Weight: 88.9 kg (195 lb 15.8 oz) 89 kg (196 lb 3.4 oz) 92.3 kg (203 lb 7.8 oz)    Intake/Output: I/O last 3 completed shifts: In: 3919.5 [I.V.:3122.8; NG/GT:346.7; IV Piggyback:450] Out: 100 [Emesis/NG output:100]   Intake/Output this shift:  Total I/O In: 1126.8 [I.V.:601.3; Blood:358; NG/GT:167.5] Out: -   Physical Exam: General: No acute distress  Head: Normocephalic, atraumatic. ETT  Eyes: Anicteric  Neck: No masses  Lungs:  Ventilator dependent  Heart: paced  Abdomen:  Soft, nontender, obese  Extremities: Bilateral BKA, generalized edema  Neurologic: Sedated, intubated  Skin: No lesions  Access: RUE AVF - ulcerations - covered with bandage    Basic Metabolic Panel:  Recent Labs Lab 12/19/15 1423 19-Dec-2015 1645 19-Dec-2015 2009 12/19/15 2310 12/14/15 0200 12/14/15 0400 12/14/15 0630 12/14/15 1233  NA 133* 135  131* 132* 133* 132* 133*  --   K 4.3 4.4 4.4 4.7 4.4 4.4 4.7  --   CL 100* 101 99* 100* 102 99* 99*  --   CO2 22 22 20* 18* 18* 20* 19*  --   GLUCOSE 136* 113* 152* 115* 96 104* 112* 234*  BUN 44* 39* 41* 43* 42* 44* 45*  --   CREATININE 3.66* 3.27* 3.31* 3.56* 3.32* 3.58* 3.54*  --   CALCIUM 9.0 8.9 8.8* 9.0 8.7* 9.0 9.0  --   MG 1.9 1.7  --  1.8 1.7  --  1.9  --   PHOS 3.1 3.2  --  4.0 3.8  --  3.8  --     Liver Function Tests:  Recent Labs Lab 12/19/15 1423 Dec 19, 2015 1645 12/19/15 2310 12/14/15 0200 12/14/15 0630  ALBUMIN 1.3* 1.4* 1.3* 1.2* 1.2*   No results for input(s): LIPASE, AMYLASE in the last 168 hours. No results for input(s): AMMONIA in the last 168 hours.  CBC:  Recent Labs Lab 12/08/2015 2315  12/24/2015 2055 12/12/15 0447 12/12/15 1826 12/19/2015 0415 12/14/15 0400 12/14/15 1259  WBC 21.7*  < > 44.0* 53.0* 39.9* 34.5* 37.3*  --   NEUTROABS 19.4*  --   --   --   --  26.2* 28.7*  --   HGB 8.7*  < > 9.8* 9.9* 7.3* 7.9* 6.4* 7.6*  HCT 27.0*  < > 29.6* 31.5* 23.6* 23.8* 19.8* 23.6*  MCV 79.7*  < > 80.1 82.8 81.8 79.8* 80.7  --  PLT 126*  < > 134* 140* 95* 95* 78*  --   < > = values in this interval not displayed.  Cardiac Enzymes: No results for input(s): CKTOTAL, CKMB, CKMBINDEX, TROPONINI in the last 168 hours.  BNP: Invalid input(s): POCBNP  CBG:  Recent Labs Lab 12/14/15 0831 12/14/15 0935 12/14/15 1006 12/14/15 1140 12/14/15 1232  GLUCAP 64* 73 137* 63* 2*    Microbiology: Results for orders placed or performed during the hospital encounter of 01/01/2016  Blood culture (routine x 2)     Status: None   Collection Time: 12/26/2015 11:15 PM  Result Value Ref Range Status   Specimen Description BLOOD RIGHT WRIST  Final   Special Requests BOTTLES DRAWN AEROBIC AND ANAEROBIC [place]  Final   Culture NO GROWTH 5 DAYS  Final   Report Status 12/14/2015 FINAL  Final  Blood culture (routine x 2)     Status: None (Preliminary result)    Collection Time: 12/30/2015 11:31 PM  Result Value Ref Range Status   Specimen Description BLOOD LEFT FOREARM  Final   Special Requests BOTTLES DRAWN AEROBIC AND ANAEROBIC [place]  Final   Culture NO GROWTH 4 DAYS  Final   Report Status PENDING  Incomplete  Aerobic/Anaerobic Culture (surgical/deep wound)     Status: None (Preliminary result)   Collection Time: 12/10/15 10:09 AM  Result Value Ref Range Status   Specimen Description DECUBITIS  Final   Special Requests Immunocompromised  Final   Gram Stain   Final    DEGENERATED CELLULAR MATERIAL PRESENT FEW SQUAMOUS EPITHELIAL CELLS PRESENT ABUNDANT GRAM NEGATIVE RODS MODERATE GRAM POSITIVE RODS FEW GRAM POSITIVE COCCI IN PAIRS Performed at [facility]    Culture   Final    ABUNDANT PROTEUS MIRABILIS MODERATE ESCHERICHIA COLI NO ANAEROBES ISOLATED; CULTURE IN PROGRESS FOR 5 DAYS    Report Status PENDING  Incomplete   Organism ID, Bacteria PROTEUS MIRABILIS  Final   Organism ID, Bacteria ESCHERICHIA COLI  Final      Susceptibility   Escherichia coli - MIC*    AMPICILLIN 8 SENSITIVE Sensitive     CEFAZOLIN <=4 SENSITIVE Sensitive     CEFEPIME <=1 SENSITIVE Sensitive     CEFTAZIDIME <=1 SENSITIVE Sensitive     CEFTRIAXONE <=1 SENSITIVE Sensitive     CIPROFLOXACIN <=0.25 SENSITIVE Sensitive     GENTAMICIN <=1 SENSITIVE Sensitive     IMIPENEM <=0.25 SENSITIVE Sensitive     TRIMETH/SULFA <=20 SENSITIVE Sensitive     AMPICILLIN/SULBACTAM 4 SENSITIVE Sensitive     PIP/TAZO <=4 SENSITIVE Sensitive     Extended ESBL NEGATIVE Sensitive     * MODERATE ESCHERICHIA COLI   Proteus mirabilis - MIC*    AMPICILLIN <=2 SENSITIVE Sensitive     CEFAZOLIN <=4 SENSITIVE Sensitive     CEFEPIME <=1 SENSITIVE Sensitive     CEFTAZIDIME <=1 SENSITIVE Sensitive     CEFTRIAXONE <=1 SENSITIVE Sensitive     CIPROFLOXACIN >=4 RESISTANT Resistant     GENTAMICIN <=1 SENSITIVE Sensitive     IMIPENEM 0.5 SENSITIVE Sensitive     TRIMETH/SULFA  >=320 RESISTANT Resistant     AMPICILLIN/SULBACTAM <=2 SENSITIVE Sensitive     PIP/TAZO <=4 SENSITIVE Sensitive     * ABUNDANT PROTEUS MIRABILIS  Aerobic Culture (superficial specimen)     Status: None   Collection Time: 12/10/15  1:34 PM  Result Value Ref Range Status   Specimen Description HEMODIALYSIS FISTULA  Final   Special Requests LEFT AVF  Final  Gram Stain   Final    MODERATE WBC PRESENT,BOTH PMN AND MONONUCLEAR NO ORGANISMS SEEN    Culture   Final    NO GROWTH 2 DAYS Performed at [facility]    Report Status 12/13/2015 FINAL  Final  Culture, blood (routine x 2)     Status: None (Preliminary result)   Collection Time: 12/10/15  3:24 PM  Result Value Ref Range Status   Specimen Description BLOOD LEFT HAND  Final   Special Requests   Final    BOTTLES DRAWN AEROBIC AND ANAEROBIC  AER 6CC ANA 1CC   Culture NO GROWTH 4 DAYS  Final   Report Status PENDING  Incomplete  Culture, blood (routine x 2)     Status: None (Preliminary result)   Collection Time: 12/10/15  3:33 PM  Result Value Ref Range Status   Specimen Description BLOOD RIGHT HAND  Final   Special Requests BOTTLES DRAWN AEROBIC ONLY  2CC  Final   Culture NO GROWTH 4 DAYS  Final   Report Status PENDING  Incomplete  MRSA PCR Screening     Status: None   Collection Time: 12/31/2015  2:23 PM  Result Value Ref Range Status   MRSA by PCR NEGATIVE NEGATIVE Final    Comment:        The GeneXpert MRSA Assay (FDA approved for NASAL specimens only), is one component of a comprehensive MRSA colonization surveillance program. It is not intended to diagnose MRSA infection nor to guide or monitor treatment for MRSA infections.   Culture, expectorated sputum-assessment     Status: None   Collection Time: 12/12/15  6:48 AM  Result Value Ref Range Status   Specimen Description TRACHEAL ASPIRATE  Final   Special Requests NONE  Final   Sputum evaluation   Final    TEST NOT PERFORMED ON TRACH ASP. CREDITED,  ORDERED RESP CULTURE SDR 12/12/15    Report Status 12/12/2015 FINAL  Final  Culture, respiratory (NON-Expectorated)     Status: None   Collection Time: 12/12/15  6:48 AM  Result Value Ref Range Status   Specimen Description TRACHEAL ASPIRATE  Final   Special Requests NONE  Final   Gram Stain   Final    NO WBC SEEN RARE HYPHAL ELEMENTS SEEN Performed at [place]  Final   Report Status 12/14/2015 FINAL  Final  CULTURE, BLOOD (ROUTINE X 2) w Reflex to ID Panel     Status: None (Preliminary result)   Collection Time: 12/13/15  4:52 PM  Result Value Ref Range Status   Specimen Description BLOOD FEMORAL LEFT CATHETER  Final   Special Requests BOTTLES DRAWN AEROBIC AND ANAEROBIC [place]  Final   Culture NO GROWTH < 12 HOURS  Final   Report Status PENDING  Incomplete  CULTURE, BLOOD (ROUTINE X 2) w Reflex to ID Panel     Status: None (Preliminary result)   Collection Time: 12/13/15  4:53 PM  Result Value Ref Range Status   Specimen Description BLOOD T LUMEN FEMORAL  Final   Special Requests BOTTLES DRAWN AEROBIC AND ANAEROBIC 6CC  Final   Culture NO GROWTH < 12 HOURS  Final   Report Status PENDING  Incomplete    Coagulation Studies:  Recent Labs  12/07/2015 1613 12/10/2015 2055 12/12/15 0832  LABPROT 14.8 30.7* 17.9*  INR 1.15 2.87 1.46    Urinalysis: No results for input(s): COLORURINE, LABSPEC, PHURINE, GLUCOSEU, HGBUR, BILIRUBINUR, KETONESUR, PROTEINUR, UROBILINOGEN, NITRITE,  LEUKOCYTESUR in the last 72 hours.  Invalid input(s): APPERANCEUR    Imaging: Dg Abd 1 View  Result Date: 12/13/2015 CLINICAL DATA:  69 year old male enteric tube placement. Initial encounter. EXAM: ABDOMEN - 1 VIEW COMPARISON:  12/13/2015. FINDINGS: Portable AP views at 1215 hours. Enteric tube placed, side hole to level of the proximal gastric body. New pacing or resuscitation pads over the lower chest and right upper abdomen. Partially visible left chest  cardiac pacemaker. Stable visible mediastinal contours. Prior CABG. Extensive Aortoiliac calcified atherosclerosis noted. Large infrarenal abdominal aortic aneurysm, diameter up to 56 mm as seen on the recent CT Abdomen and Pelvis 11/28/2015. Bilateral femoral approach vascular catheters in place, that on the right is stable since 12/13/2015. New probably dual lumen catheter on the left tip projecting at the level of the proximal left iliac veins. Stable pelvic surgical clips. Osteopenia. Three small clustered metallic foreign bodies again project over the greater curve of the stomach an or present on the prior CT. Non obstructed bowel gas pattern. IMPRESSION: 1. Enteric tube placed, side hole to level of the proximal stomach. 2. Bilateral pelvic vascular catheters, a new dual-lumen appearing catheter on the left since 12/31/2015 with tip at the level of the proximal iliac veins. 3. Extensive Aortoiliac calcified atherosclerosis with infrarenal abdominal aortic aneurysm, diameter 56 mm on recent CT. 4. Non obstructed bowel gas pattern. Chronic small retained metallic foreign bodies in the distal stomach. Electronically Signed   By: Genevie Ann M.D.   On: 12/13/2015 12:33   Ct Head Wo Contrast  Result Date: 12/13/2015 CLINICAL DATA:  Hypotension, possible sepsis, elevated white blood cell count, blood per rectum EXAM: CT HEAD WITHOUT CONTRAST TECHNIQUE: Contiguous axial images were obtained from the base of the skull through the vertex without intravenous contrast. COMPARISON:  CT brain scan of 11/28/2015 FINDINGS: Brain: The flattening of the spinal cord at the C1-2 level is again noted. The ventricular system remains prominent, as are the cortical sulci diffusely, consistent with atrophy. 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 No hemorrhage, mass lesion, or acute infarction is seen.  Vascular: No vascular abnormality is evident on this unenhanced study. Skull: On bone window images, no calvarial abnormality is noted. Sinuses/Orbits: There is fluid and mucosal thickening within the left partition of the sphenoid sinus. The remainder of the paranasal sinuses are clear. Other: None IMPRESSION: 1. No acute intracranial abnormality. 2. Stable atrophy and moderate small vessel ischemic change with old left frontal infarct. 3. Air-fluid level within the left maxillary sinus. 4. No change in flattening of the cord at the C1-2 level Electronically Signed   By: Ivar Drape M.D.   On: 12/13/2015 09:44   Dg Chest Port 1 View  Result Date: 12/13/2015 CLINICAL DATA:  Hypoxia EXAM: PORTABLE CHEST 1 VIEW COMPARISON:  December 12, 2015 FINDINGS: Endotracheal tube tip is 3.4 cm above the carina. Pacemaker leads are attached the right atrium and right ventricle. No pneumothorax. There is mild mid lung atelectasis bilaterally. Lungs elsewhere are clear. Heart is mildly enlarged with pulmonary vascular within normal limits. There is atherosclerotic calcification in the aorta. No adenopathy. IMPRESSION: Endotracheal tube as described without pneumothorax. Stable cardiomegaly. Mildly midlung atelectatic change bilaterally. No frank edema or consolidation. Aortic atherosclerosis. Electronically Signed   By: Lowella Grip III M.D.   On: 12/13/2015 08:38     Medications:   . amiodarone 30 mg/hr (12/14/15  1500)  . epinephrine Stopped (12/12/15 0547)  . fentaNYL infusion INTRAVENOUS Stopped (12/13/15 0730)  . norepinephrine (LEVOPHED) Adult infusion 26.027 mcg/min (12/14/15 1500)  . pureflow Stopped (12/12/15 0915)  . vasopressin (PITRESSIN) infusion - *FOR SHOCK* 0.03 Units/min (12/14/15 1500)   . sodium chloride   Intravenous Once  . albumin human  12.5 g Intravenous Once in dialysis  . brimonidine  1 drop Left Eye TID  . chlorhexidine gluconate (MEDLINE KIT)  15 mL Mouth Rinse BID  . epoetin  (EPOGEN/PROCRIT) injection  10,000 Units Intravenous Q T,Th,Sa-HD  . feeding supplement (VITAL HIGH PROTEIN)  1,000 mL Per Tube Q24H  . insulin aspart  0-15 Units Subcutaneous Q4H  . mouth rinse  15 mL Mouth Rinse 10 times per day  . pantoprazole  40 mg Intravenous Q12H  . piperacillin-tazobactam (ZOSYN)  IV  3.375 g Intravenous Q12H  . prednisoLONE acetate  2 drop Both Eyes BID  . sodium chloride flush  10-40 mL Intracatheter Q12H  . sodium hypochlorite   Irrigation BID  . vancomycin  1,000 mg Intravenous Q T,Th,Sa-HD   acetaminophen **OR** acetaminophen, bisacodyl, fentaNYL, heparin, ipratropium-albuterol, midazolam, midazolam, nitroGLYCERIN, [DISCONTINUED] ondansetron **OR** ondansetron (ZOFRAN) IV, polyvinyl alcohol, sodium chloride, sodium chloride flush  Assessment/ Plan:  70 y.o. male with a PMHx of ESRD on HD at the [place], anemia chronic kidney disease, secondary hyperparathyroidism, aortic aneurysm, bilateral below the knee amputation, peptic ulcer disease  TTS [facility] AVF  1.  ESRD on HD:   - CRRT would Not run due to problem with dialysis cathter - Tolerated HD yesterday - Trial of short HD today. UF goal 1.5-2 L - iv albumin with HD  2. Sepsis: source seems to be from infected decubitus ulcer. With hypotension and leukocytosis - abx as per ICU team  3.  Anemia chronic kidney disease: hemoglobin 7.6. With thrombocytopenia - epo with HD treatment.   4.  Secondary hyperparathyroidism: PTH 156 on 11/29/15. Calcium and phosphorus at goal.  - Cinacalcet - sevelamer Phos 3.8  5. Acute resp failure Requiring mechanical ventilation   LOS: 4 , 12/13/20173:25 PM

## 2015-12-14 NOTE — Progress Notes (Signed)
Pre Dialysis 

## 2015-12-14 NOTE — Progress Notes (Signed)
Pt started receiving dialysis around 1900/1930, pt BP started to decrease slowly increased Levophed to help maintain adequate BP, dialysis slowed down, still slowly BP would decrease, NP notified. Order placed to start Neo-synephrine, waiting for medication, pt BP rapidly decreased, dialysis emergently stopped by dialysis RN. NP made aware. Dialysis RN notified and informed Nephrologist. When therapy stopped pt BP returned to acceptable range. Will continue to monitor. 267 mL removed off pt.

## 2015-12-14 NOTE — Care Management (Signed)
[facility] updated that patient is stable for transfer to them today. They called back later stating that they will need new consents from daughter and MD. I have left copy of patient consent at patient's beside for daughter (family notified).

## 2015-12-14 NOTE — Progress Notes (Signed)
Pharmacy Antibiotic Note  Cimarron Pufahl is a 70 y.o. male admitted on 12/04/2015 with sepsis.  Pharmacy has been consulted for Zosyn and vancomycin dosing.  12/12 PM CRRT now stopped   Plan: 1. Change Zosyn back to  3.375 gm IV Q12H EI 2. Vanc 1 gm IV Q T-Th-Sat HD with next dose to be given on 12/12 @ 18:00 (pt has already completed HD 12/12).  Will draw next Vanc trough 30 minutes before 3rd HD session scheduled for 12/16.     Height: 5\' 1"  (154.9 cm) Weight: 203 lb 7.8 oz (92.3 kg) IBW/kg (Calculated) : 52.3  Temp (24hrs), Avg:101 F (38.3 C), Min:98.6 F (37 C), Max:102.9 F (39.4 C)   Recent Labs Lab 12/10/15 0044  12/30/2015 2055 12/12/15 0447  12/12/15 1301  12/12/15 1826  12/13/15 0415  12/13/15 1025 12/13/15 1423  12/13/15 2009 12/13/15 2310 12/14/15 0200 12/14/15 0400 12/14/15 0630  WBC  --   < > 44.0* 53.0*  --   --   --  39.9*  --  34.5*  --   --   --   --   --   --   --  37.3*  --   CREATININE  --   < > 3.85*  --   < >  --   < > 3.33*  3.42*  < > 3.57*  < > 3.66* 3.66*  < > 3.31* 3.56* 3.32* 3.58* 3.54*  LATICACIDVEN 1.9  --  4.7* 6.9*  --  3.5*  --   --   --   --   --  3.1*  --   --   --   --   --   --   --   VANCORANDOM  --   --   --   --   --   --   --   --   --   --   --   --  29  --   --   --   --   --   --   < > = values in this interval not displayed.  Estimated Creatinine Clearance: 18.2 mL/min (by C-G formula based on SCr of 3.54 mg/dL (H)).    Allergies  Allergen Reactions  . Ivp Dye [Iodinated Diagnostic Agents]     Thank you for allowing pharmacy to be a part of this patient's care.  Jullien Granquist M Panhia Karl, Pharm.D Clinical Pharmacist 12/14/2015 1:10 PM

## 2015-12-14 NOTE — Progress Notes (Signed)
Inpatient Diabetes Program Recommendations  AACE/ADA: New Consensus Statement on Inpatient Glycemic Control (2015)  Target Ranges:  Prepandial:   less than 140 mg/dL      Peak postprandial:   less than 180 mg/dL (1-2 hours)      Critically ill patients:  140 - 180 mg/dL   Lab Results  Component Value Date   GLUCAP 59 (L) 12/14/2015    Review of Glycemic Control  Results for [name], [name] (MRN [id number]) as of 12/14/2015 13:08  Ref. Range 12/13/2015 23:10 12/14/2015 02:00 12/14/2015 04:00 12/14/2015 06:30 12/14/2015 07:00  Glucose Latest Ref Range: 65 - 99 mg/dL 115 (H) 96 104 (H) 112 (H)     Diabetes history: Type 2 Outpatient Diabetes medications: none Current orders for Inpatient glycemic control: Novolog moderate correction scale 0-15 units q4h  Inpatient Diabetes Program Recommendations:  Please consider placing this patient on the ICU Glycemic control order set - sensitive correction coverage 1-3 units q4h  Gentry Fitz, RN, [place], [place], CDE Diabetes Coordinator Inpatient Diabetes Program  [phone] (Team Pager) [phone] ([employer]) 12/14/2015 1:13 PM

## 2015-12-14 NOTE — Progress Notes (Signed)
[name], NP hold insulin at this time d/t inconsistencies in blood glucose readings today. Insulin held. Patient's temp elevated again to 101, ice packs replaced under axilla. Gregory Livingston

## 2015-12-14 NOTE — Progress Notes (Signed)
70 year old male with multiple medical issues to include CHF, diabetes mellitus, end-stage renal disease on dialysis, hypertension, multiple myeloma, peptic ulcer perforation approximately 6 weeks ago, peripheral vascular disease requiring bilateral BKA's, AAA who came in from a nursing home with altered mental status fever and was admitted for sepsis. The patient also had a GI bleed at the time with large bloody stools. He did have an endoscopy which revealed multiple ulcers none of which were bleeding but 1 massive ulcer. Of note he has had to code events but has not had any for about 48 hours.   I debrided necrotic material from his sacral ulcer at bedside yesterday.  He has done well, still on some levo and with a fever but overall some improvement.   Vitals:   12/14/15 1800 12/14/15 1846  BP: (!) 79/24   Pulse: (!) 27   Resp: (!) 32   Temp:  99.2 F (37.3 C)   PE:  Gen: NAD Sacrum:  Large area of debridement covering about 25 x 15cm from upper back, sacrum and buttock, minimal necrotic gray film on bed of wound and debrides with gauze over the coccyx, much improved from yesterday  CBC Latest Ref Rng & Units 12/14/2015 12/14/2015 12/13/2015  WBC 3.8 - 10.6 K/uL - 37.3(H) 34.5(H)  Hemoglobin 13.0 - 18.0 g/dL 7.6(L) 6.4(L) 7.9(L)  Hematocrit 40.0 - 52.0 % 23.6(L) 19.8(L) 23.8(L)  Platelets 150 - 440 K/uL - 78(L) 95(L)   CMP Latest Ref Rng & Units 12/14/2015 12/14/2015 12/14/2015  Glucose 65 - 99 mg/dL 200(H) 234(H) 112(H)  BUN 6 - 20 mg/dL 47(H) - 45(H)  Creatinine 0.61 - 1.24 mg/dL 3.79(H) - 3.54(H)  Sodium 135 - 145 mmol/L 130(L) - 133(L)  Potassium 3.5 - 5.1 mmol/L 5.0 - 4.7  Chloride 101 - 111 mmol/L 98(L) - 99(L)  CO2 22 - 32 mmol/L 14(L) - 19(L)  Calcium 8.9 - 10.3 mg/dL 9.0 - 9.0  Total Protein 6.5 - 8.1 g/dL - - -  Total Bilirubin 0.3 - 1.2 mg/dL - - -  Alkaline Phos 38 - 126 U/L - - -  AST 15 - 41 U/L - - -  ALT 17 - 63 U/L - - -   A/P:  71 year old male with multiple  medical issues admitted for sepsis with two cardiac arrestes this admission.  Sacral area with minimal necrotic tissue today, Continue Dakin's dressings with Kerlix as well as dry gauze and ABD pads over this area twice daily. I will continue to assess and if no more necrotic material over the next few days may be amenable to a wound VAC placement.

## 2015-12-14 NOTE — Progress Notes (Signed)
On morning assessment patient's temp elevated before starting blood transfusion. Tylenol given at beginning of transfusion, but continued to be elevated until ice packs place under patient's arms. Currently temp 101. CBG also reading low when drawn from finger stick, CVC and ART line drawn with all different readings. Blood glucose drawn and sent to lab for testing which revealed glucose of 234.  Blood transfusion completed at 1154 and H&H orders placed. Results for H&H were 7.6 and 23.6. Hinton Dyer, NP and Dr. Stevenson Clinch both aware of elevated temp, NP notified of H&H results. Dr. Stevenson Clinch notified of different CBG readings. Family visited today and updated on plan of care. Wilnette Kales

## 2015-12-14 NOTE — Care Management (Signed)
I have faxed updated notes to [facility] for their records. RNCM will continue to follow.

## 2015-12-14 NOTE — Progress Notes (Signed)
Dialysis started 

## 2015-12-14 NOTE — Progress Notes (Signed)
Nutrition Follow-up  DOCUMENTATION CODES:   Obesity unspecified  INTERVENTION:  -Discussed nutritional poc during ICU rounds and received verbal order from MD Mungal to begin titrating TF to goal. Recommend increasing TF to rate of 30 ml/hr, goal rate is 50 ml/hr. Continue to assess  NUTRITION DIAGNOSIS:   Inadequate oral intake related to inability to eat as evidenced by NPO status.  Being addressed via TF  GOAL:   Provide needs based on ASPEN/SCCM guidelines  MONITOR:   Vent status, Labs, Weight trends, I & O's, Skin, TF tolerance  REASON FOR ASSESSMENT:   Ventilator, Low Braden    ASSESSMENT:   70 y.o. male with a known history of Abdominal aortic aneurysm, diabetes mellitus type 2, end-stage renal disease on dialysis, hypertension, hyperparathyroidism, multiple myeloma, peptic ulcer disease, peripheral vascular disease is a patient of [facility]. Patient presented from [facility] to ED with hypotension. Patient found to have ARF with severe respiratory acidosis, cardiac arrest after upper GI endoscopy, Acute GI bleed (has resolved now s/p endoscopy), severe sepsis from infected decubitus ulcer.  Pt remains on vent support, febrile, received HD yesterday with no  Pt s/p surgical debridement of large area of necrosis covering 20 x 15 cm of sacrum and upper back Receiving 1 unit of PRBC for Hgb 6.4 Tolerating Vital High Protein at rate of 20 ml/hr Labs: sodium 133 Meds: reviewed  Diet Order:  Diet NPO time specified  Skin:  Wound (see comment) (US/Full Thickness to buttocks, Stg II to sacrum)  Last BM:  12/13/2015  Height:   Ht Readings from Last 1 Encounters:  12/07/2015 5' 1"  (1.549 m)    Weight:   Wt Readings from Last 1 Encounters:  12/14/15 203 lb 7.8 oz (92.3 kg)    Ideal Body Weight:  50.9 kg  BMI:  Body mass index is 38.45 kg/m.  Estimated Nutritional Needs:   Kcal:  [phone] (11-14 kcal/kg)  Protein:  >/= 102 grams (2  grams/kg IBW)  Fluid:  >/= 1.2 L/day or per MD in setting of ESRD on CRRT  EDUCATION NEEDS:   Education needs no appropriate at this time  [place], [place], [place] [phone] Pager  [phone] Weekend/On-Call Pager

## 2015-12-15 ENCOUNTER — Inpatient Hospital Stay: Payer: Non-veteran care

## 2015-12-15 DIAGNOSIS — G9340 Encephalopathy, unspecified: Secondary | ICD-10-CM

## 2015-12-15 DIAGNOSIS — R571 Hypovolemic shock: Secondary | ICD-10-CM

## 2015-12-15 DIAGNOSIS — L89154 Pressure ulcer of sacral region, stage 4: Secondary | ICD-10-CM

## 2015-12-15 DIAGNOSIS — J9600 Acute respiratory failure, unspecified whether with hypoxia or hypercapnia: Secondary | ICD-10-CM

## 2015-12-15 DIAGNOSIS — D65 Disseminated intravascular coagulation [defibrination syndrome]: Secondary | ICD-10-CM

## 2015-12-15 DIAGNOSIS — J9601 Acute respiratory failure with hypoxia: Secondary | ICD-10-CM

## 2015-12-15 DIAGNOSIS — I469 Cardiac arrest, cause unspecified: Secondary | ICD-10-CM

## 2015-12-15 LAB — RENAL FUNCTION PANEL
Albumin: 1.4 g/dL — ABNORMAL LOW (ref 3.5–5.0)
Albumin: 1 g/dL — ABNORMAL LOW (ref 3.5–5.0)
Anion gap: 18 — ABNORMAL HIGH (ref 5–15)
Anion gap: 24 — ABNORMAL HIGH (ref 5–15)
BUN: 43 mg/dL — AB (ref 6–20)
BUN: 41 mg/dL — ABNORMAL HIGH (ref 6–20)
CALCIUM: 9 mg/dL (ref 8.9–10.3)
CHLORIDE: 99 mmol/L — AB (ref 101–111)
CHLORIDE: 91 mmol/L — AB (ref 101–111)
CO2: 16 mmol/L — AB (ref 22–32)
CO2: 9 mmol/L — ABNORMAL LOW (ref 22–32)
CREATININE: 3.26 mg/dL — AB (ref 0.61–1.24)
CREATININE: 3.39 mg/dL — AB (ref 0.61–1.24)
Calcium: 9.4 mg/dL (ref 8.9–10.3)
GFR calc Af Amer: 20 mL/min — ABNORMAL LOW (ref >60)
GFR calc non Af Amer: 17 mL/min — ABNORMAL LOW (ref >60)
GFR, EST AFRICAN AMERICAN: 19 mL/min — AB (ref >60)
GFR, EST NON AFRICAN AMERICAN: 18 mL/min — AB (ref >60)
Glucose, Bld: 204 mg/dL — ABNORMAL HIGH (ref 65–99)
Glucose, Bld: 299 mg/dL — ABNORMAL HIGH (ref 65–99)
POTASSIUM: 6.4 mmol/L — AB (ref 3.5–5.1)
Phosphorus: 5.3 mg/dL — ABNORMAL HIGH (ref 2.5–4.6)
Phosphorus: 8.6 mg/dL — ABNORMAL HIGH (ref 2.5–4.6)
Potassium: 4.6 mmol/L (ref 3.5–5.1)
SODIUM: 133 mmol/L — AB (ref 135–145)
Sodium: 124 mmol/L — ABNORMAL LOW (ref 135–145)

## 2015-12-15 LAB — LACTIC ACID, PLASMA: LACTIC ACID, VENOUS: 14.3 mmol/L — AB (ref 0.5–1.9)

## 2015-12-15 LAB — TYPE AND SCREEN
ABO/RH(D): O POS
ANTIBODY SCREEN: NEGATIVE
Unit division: 0
Unit division: 0
Unit division: 0
Unit division: 0
Unit division: 0

## 2015-12-15 LAB — CULTURE, BLOOD (ROUTINE X 2)
CULTURE: NO GROWTH
Culture: NO GROWTH
Culture: NO GROWTH

## 2015-12-15 LAB — BLOOD GAS, ARTERIAL
Acid-base deficit: 13.3 mmol/L — ABNORMAL HIGH (ref 0.0–2.0)
BICARBONATE: 12 mmol/L — AB (ref 20.0–28.0)
FIO2: 0.4
FIO2: 40
MECHVT: 420 mL
O2 CONTENT: 40 L/min
O2 Saturation: 93.5 %
PATIENT TEMPERATURE: 37
PCO2 ART: 34 mmHg (ref 32.0–48.0)
PEEP/CPAP: 5 cmH2O
PEEP: 5 cmH2O
Patient temperature: 37
RATE: 16 resp/min
RATE: 16 resp/min
VT: 420 mL
pCO2 arterial: 25 mmHg — ABNORMAL LOW (ref 32.0–48.0)
pH, Arterial: 7.29 — ABNORMAL LOW (ref 7.350–7.450)
pO2, Arterial: 77 mmHg — ABNORMAL LOW (ref 83.0–108.0)
pO2, Arterial: 64 mmHg — ABNORMAL LOW (ref 83.0–108.0)

## 2015-12-15 LAB — CBC WITH DIFFERENTIAL/PLATELET
BASOS ABS: 0 10*3/uL (ref 0–0.1)
Band Neutrophils: 3 %
Basophils Relative: 0 %
EOS PCT: 0 %
Eosinophils Absolute: 0 10*3/uL (ref 0–0.7)
HEMATOCRIT: 22.4 % — AB (ref 40.0–52.0)
HEMOGLOBIN: 7.1 g/dL — AB (ref 13.0–18.0)
LYMPHS PCT: 11 %
Lymphs Abs: 4.3 10*3/uL — ABNORMAL HIGH (ref 1.0–3.6)
MCH: 26.9 pg (ref 26.0–34.0)
MCHC: 31.9 g/dL — ABNORMAL LOW (ref 32.0–36.0)
MCV: 84.3 fL (ref 80.0–100.0)
MONOS PCT: 1 %
Monocytes Absolute: 0.4 10*3/uL (ref 0.2–1.0)
NEUTROS ABS: 34.8 10*3/uL — AB (ref 1.4–6.5)
NEUTROS PCT: 85 %
Platelets: 72 10*3/uL — ABNORMAL LOW (ref 150–440)
RBC: 2.65 MIL/uL — AB (ref 4.40–5.90)
RDW: 19.2 % — ABNORMAL HIGH (ref 11.5–14.5)
WBC: 39.5 10*3/uL — AB (ref 3.8–10.6)

## 2015-12-15 LAB — MAGNESIUM
MAGNESIUM: 2.1 mg/dL (ref 1.7–2.4)
MAGNESIUM: 2.5 mg/dL — AB (ref 1.7–2.4)

## 2015-12-15 LAB — FIBRINOGEN: FIBRINOGEN: 236 mg/dL (ref 210–475)

## 2015-12-15 LAB — COMPREHENSIVE METABOLIC PANEL
ALT: 163 U/L — ABNORMAL HIGH (ref 17–63)
AST: 354 U/L — ABNORMAL HIGH (ref 15–41)
Albumin: 1.3 g/dL — ABNORMAL LOW (ref 3.5–5.0)
Alkaline Phosphatase: 109 U/L (ref 38–126)
Anion gap: 19 — ABNORMAL HIGH (ref 5–15)
BILIRUBIN TOTAL: 0.9 mg/dL (ref 0.3–1.2)
BUN: 43 mg/dL — ABNORMAL HIGH (ref 6–20)
CHLORIDE: 99 mmol/L — AB (ref 101–111)
CO2: 14 mmol/L — ABNORMAL LOW (ref 22–32)
Calcium: 9.7 mg/dL (ref 8.9–10.3)
Creatinine, Ser: 3.42 mg/dL — ABNORMAL HIGH (ref 0.61–1.24)
GFR, EST AFRICAN AMERICAN: 19 mL/min — AB (ref >60)
GFR, EST NON AFRICAN AMERICAN: 17 mL/min — AB (ref >60)
Glucose, Bld: 113 mg/dL — ABNORMAL HIGH (ref 65–99)
POTASSIUM: 5 mmol/L (ref 3.5–5.1)
Sodium: 132 mmol/L — ABNORMAL LOW (ref 135–145)
TOTAL PROTEIN: 3.8 g/dL — AB (ref 6.5–8.1)

## 2015-12-15 LAB — FIBRIN DEGRADATION PROD.(ARMC ONLY)

## 2015-12-15 LAB — GLUCOSE, RANDOM
GLUCOSE: 113 mg/dL — AB (ref 65–99)
GLUCOSE: 522 mg/dL — AB (ref 65–99)
Glucose, Bld: 200 mg/dL — ABNORMAL HIGH (ref 65–99)
Glucose, Bld: 165 mg/dL — ABNORMAL HIGH (ref 65–99)
Glucose, Bld: 231 mg/dL — ABNORMAL HIGH (ref 65–99)

## 2015-12-15 LAB — AEROBIC/ANAEROBIC CULTURE W GRAM STAIN (SURGICAL/DEEP WOUND)

## 2015-12-15 LAB — AEROBIC/ANAEROBIC CULTURE (SURGICAL/DEEP WOUND)

## 2015-12-15 LAB — PROTIME-INR
INR: 1.39
PROTHROMBIN TIME: 17.2 s — AB (ref 11.4–15.2)

## 2015-12-15 LAB — PROCALCITONIN: Procalcitonin: 42.29 ng/mL

## 2015-12-15 LAB — APTT
APTT: 70 s — AB (ref 24–36)
aPTT: 69 seconds — ABNORMAL HIGH (ref 24–36)

## 2015-12-15 LAB — FIBRIN DERIVATIVES D-DIMER (ARMC ONLY): FIBRIN DERIVATIVES D-DIMER (ARMC): 3755 — AB (ref 0–499)

## 2015-12-15 LAB — LACTATE DEHYDROGENASE: LDH: 561 U/L — AB (ref 98–192)

## 2015-12-15 LAB — POTASSIUM: Potassium: 6.4 mmol/L (ref 3.5–5.1)

## 2015-12-15 LAB — PLATELET COUNT: PLATELETS: 75 10*3/uL — AB (ref 150–440)

## 2015-12-15 LAB — VANCOMYCIN, RANDOM: VANCOMYCIN RM: 25

## 2015-12-15 MED ORDER — SODIUM BICARBONATE 8.4 % IV SOLN: 50.0000 meq | Freq: Once | INTRAVENOUS | Status: DC

## 2015-12-15 MED ORDER — HYDROCORTISONE NA SUCCINATE PF 100 MG IJ SOLR
50.0000 mg | Freq: Four times a day (QID) | INTRAMUSCULAR | Status: DC
  Administered 2015-12-15: 50 mg via INTRAVENOUS
  Filled 2015-12-15: qty 2

## 2015-12-15 MED ORDER — EPOETIN ALFA 10000 UNIT/ML IJ SOLN
20000.0000 [IU] | INTRAMUSCULAR | Status: DC
  Administered 2015-12-15: 20000 [IU] via INTRAVENOUS
  Filled 2015-12-15: qty 2

## 2015-12-15 MED ORDER — ALBUTEROL SULFATE (2.5 MG/3ML) 0.083% IN NEBU
INHALATION_SOLUTION | RESPIRATORY_TRACT | Status: AC
  Filled 2015-12-15: qty 9

## 2015-12-15 MED ORDER — SODIUM BICARBONATE 8.4 % IV SOLN: 100.0000 meq | Freq: Once | INTRAVENOUS | Status: DC

## 2015-12-15 MED ORDER — LINEZOLID 600 MG/300ML IV SOLN
600.0000 mg | Freq: Two times a day (BID) | INTRAVENOUS | Status: DC
  Administered 2015-12-15: 600 mg via INTRAVENOUS
  Filled 2015-12-15 (×2): qty 300

## 2015-12-15 MED ORDER — INSULIN ASPART 100 UNIT/ML IV SOLN
10.0000 [IU] | Freq: Once | INTRAVENOUS | Status: AC
Start: 2015-12-15 — End: 2015-12-15
  Administered 2015-12-15: 10 [IU] via INTRAVENOUS
  Filled 2015-12-15: qty 0.1

## 2015-12-15 MED ORDER — STERILE WATER FOR INJECTION IV SOLN
INTRAVENOUS | Status: DC
  Administered 2015-12-15: 19:00:00 via INTRAVENOUS
  Filled 2015-12-15: qty 850

## 2015-12-15 MED ORDER — DAKINS (1/4 STRENGTH) 0.125 % EX SOLN
Freq: Two times a day (BID) | CUTANEOUS | Status: DC
  Filled 2015-12-15: qty 473

## 2015-12-15 MED ORDER — SODIUM POLYSTYRENE SULFONATE 15 GM/60ML PO SUSP: 30.0000 g | Freq: Once | ORAL | Status: DC

## 2015-12-15 MED ORDER — PHENYLEPHRINE HCL 10 MG/ML IJ SOLN
0.0000 ug/min | INTRAVENOUS | Status: DC
  Administered 2015-12-15 (×2): 400 ug/min via INTRAVENOUS
  Administered 2015-12-15: 300 ug/min via INTRAVENOUS
  Filled 2015-12-15 (×4): qty 4

## 2015-12-15 MED ORDER — ALBUTEROL SULFATE (2.5 MG/3ML) 0.083% IN NEBU
10.0000 mg | INHALATION_SOLUTION | Freq: Once | RESPIRATORY_TRACT | Status: AC
  Administered 2015-12-15: 10 mg via RESPIRATORY_TRACT
  Filled 2015-12-15: qty 12

## 2015-12-15 MED ORDER — SODIUM BICARBONATE 8.4 % IV SOLN
INTRAVENOUS | Status: AC
  Filled 2015-12-15: qty 150

## 2015-12-15 MED ORDER — PHENYLEPHRINE HCL 10 MG/ML IJ SOLN
0.0000 ug/min | INTRAVENOUS | Status: DC
  Administered 2015-12-15: 40 ug/min via INTRAVENOUS
  Filled 2015-12-15: qty 2

## 2015-12-15 MED ORDER — EPOETIN ALFA 20000 UNIT/ML IJ SOLN: 20000.0000 [IU] | INTRAMUSCULAR | Status: DC

## 2015-12-15 MED ORDER — DEXTROSE 50 % IV SOLN
1.0000 | Freq: Once | INTRAVENOUS | Status: AC
  Administered 2015-12-15: 50 mL via INTRAVENOUS
  Filled 2015-12-15: qty 50

## 2015-12-15 MED ORDER — SODIUM BICARBONATE 8.4 % IV SOLN
INTRAVENOUS | Status: AC
  Filled 2015-12-15: qty 50

## 2015-12-15 MED ORDER — CEFAZOLIN IN D5W 1 GM/50ML IV SOLN
1.0000 g | Freq: Two times a day (BID) | INTRAVENOUS | Status: DC
  Administered 2015-12-15: 1 g via INTRAVENOUS
  Filled 2015-12-15 (×2): qty 50

## 2015-12-15 MED ORDER — EPINEPHRINE PF 1 MG/ML IJ SOLN
0.5000 ug/min | INTRAVENOUS | Status: DC
  Filled 2015-12-15 (×2): qty 4

## 2015-12-15 MED ORDER — PHENYLEPHRINE HCL 10 MG/ML IJ SOLN: 0.0000 ug/min | INTRAVENOUS | Status: DC

## 2015-12-15 MED ORDER — SODIUM BICARBONATE 8.4 % IV SOLN
50.0000 meq | Freq: Once | INTRAVENOUS | Status: AC
  Administered 2015-12-15: 50 meq via INTRAVENOUS
  Filled 2015-12-15: qty 50

## 2015-12-16 LAB — HAPTOGLOBIN: Haptoglobin: 79 mg/dL (ref 34–200)

## 2015-12-17 MED FILL — Medication: Qty: 2 | Status: AC

## 2015-12-18 LAB — CULTURE, BLOOD (ROUTINE X 2)
CULTURE: NO GROWTH
Culture: NO GROWTH

## 2016-01-02 NOTE — Progress Notes (Signed)
Epoetin alfa order clarified with Dr. Candiss Norse since patient unable to tolerate HD at this time. Verbal order received to change order to weekly subq. Orders changed. Hinton Dyer, NP notified of blood pressure drop and levo increased to 60 mcg. Neo ordered. Wilnette Kales

## 2016-01-02 NOTE — Progress Notes (Signed)
Attempted to place right internal jugular dialysis catheter, however unsuccessful.  Attempted to notify pts daughter Drako Reiners, however she did not answer the phone and her voicemail is not setup therefore unable to leave a voicemail.  Marda Stalker, [place] Pager [phone] (please enter 7 digits) PCCM Consult Pager [phone] (please enter 7 digits)

## 2016-01-02 NOTE — Progress Notes (Signed)
Margarita Grizzle from the [place] called to notify that patient is not a candidate for transfer d/t unstable condition. Hinton Dyer, NP aware. Wilnette Kales

## 2016-01-02 NOTE — Discharge Summary (Signed)
Death  Summary   Name: Gregory [place]  MRN:   [id number]  DOB:   [birth year]             ADMISSION DATE:  12/29/2015   CONSULTATION DATE: 12/07/2015  REFERRING MD: Dr. Posey Pronto   CHIEF COMPLAINT:  Cardiac arrest  Date of Death: 12/30/2015 Time of Death: 20:36  [name] was a 71 yo male SNF resident with a significant medical history concerning for AAA, CHF, DM, ESRD, Hyperparathyroidism, Hypertension, multiple myeloma, peptic ulcer with perforation and PVD. Patient  presented to the ED via EMS on 12/9/ with hypotension. EMS was called because SNF staff were concerned that patient is septic. He was found to have a significantly elevated WBC hence he was admitted and treatment for sepsis was initiated. Possible source was thought to be his sacral decubitus ulcer. 12/19/2015 patient was noted to have a large blood stools that evolved to just bright red blood with multiple clots from the rectum. He was transfused 3 units of packed red blood cells and a bedside upper GI endoscopy was performed. Postprocedure, patient became hypoxic, hypotensive and subsequently went into cardiac arrest. He was resuscitated for approximately 10 minutes with return of spontaneous circulation.further management. His EGD showed severe ulcerative colitis with a massive the stock clean-based gastric ulcer.The patient continues to be severely hypotensive requiring multiple pressors and IV fluids. He had  end-stage renal disease and was unable to tolerate CRRT .    Over the course of his hospitalization his condition deteriorated requiring multiple pressors, and  Possible DIC with  multiple Organ failure.  Patient was not able to tolerate any sessions of CRRT.  And was in severe septic shock. Patient was on multiple pressors.  Patient went into PEA cardiac arrest.Chest compression and other ACLS medication were administered  But was unable to obtain ROSC.Marland Kitchen  Patient was pronounced dead on 12/30/15 at 20:36 pm.   Daughter was  called and informed.  Chaplain was called at the bedside.  Cause of Death  Cardiac arrest related  To   Severe Septic shock leading to multiple organ failure  Hemorrhagic anemia-acute blood loss anemia  Acute hypoxic respiratory failure  Lactic acidosis  End-stage renal disease, acute on chronic  Metabolic acidosis  DIC  Multiorgan system failure  Sepsis-possibly from sacral decubitus  history of type 2 diabetes  Metabolic encephalopathy  Sacral ulcer - unstageable   Bincy Varughese,AG-ACNP Pulmonary & Critical Care

## 2016-01-02 NOTE — Progress Notes (Signed)
71 year old male with multiple medical issues to include CHF, diabetes mellitus, end-stage renal disease on dialysis, hypertension, multiple myeloma, peptic ulcer perforation approximately 6 weeks ago, peripheral vascular disease requiring bilateral BKA's, AAA who came in from a nursing home with altered mental status fever and was admitted for sepsis. The patient also had a GI bleed at the time with large bloody stools. He did have an endoscopy which revealed multiple ulcers none of which were bleeding but 1 massive ulcer.  The patient has had decrease in BP and increased need for pressors, now on 3 to keep him at MAP >60.  He has been unable to get dialysis   Vitals:   12-24-2015 1500 24-Dec-2015 1600  BP: (!) 41/23 (!) 38/24  Pulse:    Resp: 17 18  Temp:  97.7 F (36.5 C)   PE:  Gen: GCS 3, intubated Res: Crackles bilaterally Cardiac: irregularly irregular rhythm, tachycardic Abd: soft, obese, nt Ext: 3+ pitting edema with blisters  CBC Latest Ref Rng & Units 12-24-15 December 24, 2015 12/14/2015  WBC 3.8 - 10.6 K/uL - 39.5(H) -  Hemoglobin 13.0 - 18.0 g/dL - 7.1(L) 7.6(L)  Hematocrit 40.0 - 52.0 % - 22.4(L) 23.6(L)  Platelets 150 - 440 K/uL 75(L) 72(L) -   CMP Latest Ref Rng & Units Dec 24, 2015 12-24-15 2015/12/24  Glucose 65 - 99 mg/dL 299(H) 113(H) 113(H)  BUN 6 - 20 mg/dL 41(H) 43(H) -  Creatinine 0.61 - 1.24 mg/dL 3.39(H) 3.42(H) -  Sodium 135 - 145 mmol/L 124(L) 132(L) -  Potassium 3.5 - 5.1 mmol/L 6.4(HH) 5.0 -  Chloride 101 - 111 mmol/L 91(L) 99(L) -  CO2 22 - 32 mmol/L 9(L) 14(L) -  Calcium 8.9 - 10.3 mg/dL 9.4 9.7 -  Total Protein 6.5 - 8.1 g/dL - 3.8(L) -  Total Bilirubin 0.3 - 1.2 mg/dL - 0.9 -  Alkaline Phos 38 - 126 U/L - 109 -  AST 15 - 41 U/L - 354(H) -  ALT 17 - 63 U/L - 163(H) -   A/P:  71 year old male with multiple medical issues admitted for sepsis with two cardiac arrestes this admission.  Patient is significantly more unstable today and turning him to assess  wound not advisable at this time. Continue Dakin's dressings with Kerlix as well as dry gauze and ABD pads over this area twice daily if patient can tolerate.   I will attempt to assess tomorrow for further necrotic material over the next few days as the patient condition allows.

## 2016-01-02 NOTE — Care Management (Signed)
Updated [facility] that patient is stable for transfer. Updated notes faxed to [place].

## 2016-01-02 NOTE — Progress Notes (Signed)
Inpatient Diabetes Program Recommendations  AACE/ADA: New Consensus Statement on Inpatient Glycemic Control (2015)  Target Ranges:  Prepandial:   less than 140 mg/dL      Peak postprandial:   less than 180 mg/dL (1-2 hours)      Critically ill patients:  140 - 180 mg/dL   Results for YUAN, ILGENFRITZ (MRN [id number]) as of 01-14-2016 09:58  Ref. Range 12/14/2015 06:30 12/14/2015 12:33 12/14/2015 17:47 12/14/2015 19:30 12/14/2015 23:05 01-14-2016 04:00 Jan 14, 2016 04:00 2016-01-14 07:40  Glucose Latest Ref Range: 65 - 99 mg/dL 112 (H) 234 (H) 200 (H) 227 (H) 178 (H) 204 (H) 200 (H) 165 (H)   Review of Glycemic Control  Diabetes history: DM2 Outpatient Diabetes medications: None Current orders for Inpatient glycemic control: Novolog 0-15 units Q4H  Inpatient Diabetes Program Recommendations: Correction (SSI): Please discontinue current Novolog correction and use ICU Glycemic Control order set Phase 1 to order CBGs and Novolog correction Q4H to improve glycemic control.  Thanks, Barnie Alderman, RN, MSN, CDE Diabetes Coordinator Inpatient Diabetes Program [phone] (Team Pager from 8am to 5pm)

## 2016-01-02 NOTE — Consult Note (Signed)
Reason for Consult:Altered mental status Referring Physician: Mungal  CC: Altered mental status  HPI: Gregory Livingston is an 71 y.o. male with multiple medical problems admitted with sepsis.  Hospitalization has been complicated by hypotension, GIB hypoxia and cardiac arrest requiring 10 minutes of resuscitative efforts until return of ROSC.  Patient has not returned to premorbid mental status.  Consult called for further recommendations. Patient unable to provide any history.  Family not present therefore all history obtained from the chart.    Past Medical History:  Diagnosis Date  . AAA (abdominal aortic aneurysm) ([place])   . CHF (congestive heart failure) ([place])   . Diabetes mellitus without complication ([place])   . Dialysis patient ([place])   . Hyperparathyroidism ([place])   . Hypertension   . Multiple myeloma ([place])   . Peptic ulcer with perforation ([place])   . PVD (peripheral vascular disease) ([place])     Past Surgical History:  Procedure Laterality Date  . AV FISTULA PLACEMENT    . BELOW KNEE LEG AMPUTATION Bilateral   . bilateral below knee amputation    . ESOPHAGOGASTRODUODENOSCOPY (EGD) WITH PROPOFOL N/A 12/28/2015   Procedure: ESOPHAGOGASTRODUODENOSCOPY (EGD) WITH PROPOFOL;  Surgeon: Wilford Corner, MD;  Location: [facility] ENDOSCOPY;  Service: Endoscopy;  Laterality: N/A;  . PACEMAKER PLACEMENT      Family History  Problem Relation Age of Onset  . Hypertension Other     Social History:  reports that he has quit smoking. He has never used smokeless tobacco. He reports that he drinks alcohol. He reports that he does not use drugs.  Allergies  Allergen Reactions  . Ivp Dye [Iodinated Diagnostic Agents]     Medications:  I have reviewed the patient's current medications. Prior to Admission:  Prescriptions Prior to Admission  Medication Sig Dispense Refill Last Dose  . acetaminophen (TYLENOL) 325 MG tablet Take 975 mg by mouth every 8 (eight) hours as needed.   12/04/2015 at [place]  .  albuterol (PROVENTIL HFA;VENTOLIN HFA) 108 (90 Base) MCG/ACT inhaler Inhale 2 puffs into the lungs every 6 (six) hours as needed for shortness of breath.   12/16/2015 at Unknown time  . atorvastatin (LIPITOR) 40 MG tablet Take 40 mg by mouth at bedtime.   12/18/2015 at Unknown time  . B Complex-C-Folic Acid (RENAL VITAMIN PO) Take 1 tablet by mouth daily.   12/22/2015 at Unknown time  . bacitracin 500 UNIT/GM ointment Apply 1 application topically daily. Apply small amount topically to left knee abrasion daily   12/02/2015 at Unknown time  . brimonidine (ALPHAGAN) 0.2 % ophthalmic solution Place 1 drop into the left eye 3 (three) times daily.   12/22/2015 at Unknown time  . calcium-vitamin D (OSCAL WITH D) 500-200 MG-UNIT tablet Take 1 tablet by mouth 2 (two) times daily.   12/25/2015 at Unknown time  . carvedilol (COREG) 25 MG tablet Take 12.5 mg by mouth every 12 (twelve) hours.    12/06/2015 at Unknown time  . cinacalcet (SENSIPAR) 60 MG tablet Take 120 mg by mouth daily. Take 120 mg every evening with food.   12/14/2015 at Unknown time  . Difluprednate (DUREZOL) 0.05 % EMUL Place 1 drop into both eyes daily.   12/26/2015 at Unknown time  . doxycycline (VIBRAMYCIN) 100 MG capsule Take 100 mg by mouth 2 (two) times daily.   12/08/2015 at Unknown time  . folic acid (FOLVITE) 1 MG tablet Take 2 mg by mouth daily.   12/21/2015 at Unknown time  . gabapentin (NEURONTIN) 100 MG  capsule Take 200 mg by mouth at bedtime.   12/08/2015 at Unknown time  . hydroxypropyl methylcellulose / hypromellose (ISOPTO TEARS / GONIOVISC) 2.5 % ophthalmic solution Place 1 drop into both eyes 4 (four) times daily as needed for dry eyes.   12/12/2015 at Unknown time  . isosorbide mononitrate (IMDUR) 120 MG 24 hr tablet Take 1 tablet (120 mg total) by mouth daily. (Patient taking differently: Take 240 mg by mouth daily. ) 30 tablet 0 12/07/2015 at Unknown time  . nitroGLYCERIN (NITROSTAT) 0.4 MG SL tablet Place 0.4 mg under the tongue every  5 (five) minutes as needed for chest pain.   prn at prn  . Nutritional Supplements (FEEDING SUPPLEMENT, NEPRO CARB STEADY,) LIQD Take 237 mLs by mouth 3 (three) times daily.   01/01/2016 at Unknown time  . oxyCODONE (OXY IR/ROXICODONE) 5 MG immediate release tablet Take 5 mg by mouth every 4 (four) hours as needed for severe pain.   12/10/2015 at Unknown time  . pantoprazole (PROTONIX) 40 MG tablet Take 40 mg by mouth 2 (two) times daily.   12/10/2015 at Unknown time  . ranolazine (RANEXA) 500 MG 12 hr tablet Take 500 mg by mouth 2 (two) times daily.   12/08/2015 at Unknown time  . senna (SENOKOT) 8.6 MG TABS tablet Take 2 tablets by mouth daily.   12/02/2015 at Unknown time  . sevelamer carbonate (RENVELA) 800 MG tablet Take 2,400 mg by mouth 3 (three) times daily with meals.   12/24/2015 at Unknown time  . aspirin 81 MG chewable tablet Chew 81 mg by mouth daily.   Not Taking at Unknown time  . clopidogrel (PLAVIX) 75 MG tablet Take 75 mg by mouth daily.   Not Taking at Unknown time  . Heparin Sodium, Porcine, PF 5000 UNIT/0.5ML SOLN Inject 0.5 mLs as directed every 8 (eight) hours.   Not Taking at Unknown time   Scheduled: . sodium chloride   Intravenous Once  . brimonidine  1 drop Left Eye TID  . chlorhexidine gluconate (MEDLINE KIT)  15 mL Mouth Rinse BID  . epoetin alfa  20,000 Units Intravenous Q Thu  . feeding supplement (VITAL HIGH PROTEIN)  1,000 mL Per Tube Q24H  . insulin aspart  0-15 Units Subcutaneous Q4H  . mouth rinse  15 mL Mouth Rinse 10 times per day  . pantoprazole  40 mg Intravenous Q12H  . piperacillin-tazobactam (ZOSYN)  IV  3.375 g Intravenous Q12H  . prednisoLONE acetate  2 drop Both Eyes BID  . sodium chloride flush  10-40 mL Intracatheter Q12H  . sodium hypochlorite   Irrigation BID  . vancomycin  1,000 mg Intravenous Q T,Th,Sa-HD    ROS: Patient unable to provide due to mental status  Physical Examination: Blood pressure (!) 35/16, pulse (!) 28, temperature 98.6 F  (37 C), temperature source Oral, resp. rate 20, height 5' 1"  (1.549 m), weight 96.3 kg (212 lb 4.9 oz), SpO2 (!) 77 %.  HEENT-  Normocephalic, no lesions, without obvious abnormality.  Normal external eye and conjunctiva.  Normal TM's bilaterally.  Normal auditory canals and external ears. Normal external nose, mucus membranes and septum.  Normal pharynx. Cardiovascular- S1, S2 normal, pulses palpable throughout   Lungs- chest clear, no wheezing, rales, normal symmetric air entry Abdomen- soft, non-tender; bowel sounds normal; no masses,  no organomegaly Extremities- s/p amputations Lymph-no adenopathy palpable Musculoskeletal-no joint tenderness, deformity or swelling Skin-decubitous ulcer  Neurological Examination Mental Status: Patient does not respond to verbal stimuli.  Some grimace in response to deep sternal rub.  Does not follow commands.  No verbalizations noted.  Cranial Nerves: II: patient does not respond confrontation bilaterally, pupils right 4 mm, left 2 mm,and unreactive bilaterally III,IV,VI: doll's response absent bilaterally.  V,VII: corneal reflex reduced on the right and absent on the left VIII: patient does not respond to verbal stimuli IX,X: gag reflex unable to test, XI: trapezius strength unable to test bilaterally XII: tongue strength unable to test Motor: Extremities flaccid throughout.  No spontaneous movement noted.  No purposeful movements noted. Sensory: Does not respond to noxious stimuli in any extremity. Deep Tendon Reflexes:  1+ in the upper extremities Plantars: Unable to perform due to amputations Cerebellar: Unable to perform   Laboratory Studies:   Basic Metabolic Panel:  Recent Labs Lab 12/13/15 2310 12/14/15 0200 12/14/15 0400 12/14/15 0630  12/14/15 1747 12/14/15 1930 12/14/15 2305 2016/01/01 0400 01/01/16 0740 01-01-2016 1020  NA 132* 133* 132* 133*  --  130*  --   --  133*  --  132*  K 4.7 4.4 4.4 4.7  --  5.0  --   --  4.6  --   5.0  CL 100* 102 99* 99*  --  98*  --   --  99*  --  99*  CO2 18* 18* 20* 19*  --  14*  --   --  16*  --  14*  GLUCOSE 115* 96 104* 112*  < > 200* 227* 178* 200*  204* 165* 113*  113*  BUN 43* 42* 44* 45*  --  47*  --   --  43*  --  43*  CREATININE 3.56* 3.32* 3.58* 3.54*  --  3.79*  --   --  3.26*  --  3.42*  CALCIUM 9.0 8.7* 9.0 9.0  --  9.0  --   --  9.0  --  9.7  MG 1.8 1.7  --  1.9  --  1.9  --   --  2.1  --   --   PHOS 4.0 3.8  --  3.8  --  4.7*  --   --  5.3*  --   --   < > = values in this interval not displayed.  Liver Function Tests:  Recent Labs Lab 12/14/15 0200 12/14/15 0630 12/14/15 1747 01-01-16 0400 01-01-2016 1020  AST  --   --   --   --  354*  ALT  --   --   --   --  163*  ALKPHOS  --   --   --   --  109  BILITOT  --   --   --   --  0.9  PROT  --   --   --   --  3.8*  ALBUMIN 1.2* 1.2* 1.2* 1.4* 1.3*   No results for input(s): LIPASE, AMYLASE in the last 168 hours. No results for input(s): AMMONIA in the last 168 hours.  CBC:  Recent Labs Lab 12/10/2015 2315  12/12/15 0447 12/12/15 1826 12/13/15 0415 12/14/15 0400 12/14/15 1259 01-Jan-2016 0400 01-Jan-2016 1020  WBC 21.7*  < > 53.0* 39.9* 34.5* 37.3*  --  39.5*  --   NEUTROABS 19.4*  --   --   --  26.2* 28.7*  --  34.8*  --   HGB 8.7*  < > 9.9* 7.3* 7.9* 6.4* 7.6* 7.1*  --   HCT 27.0*  < > 31.5* 23.6* 23.8* 19.8* 23.6* 22.4*  --  MCV 79.7*  < > 82.8 81.8 79.8* 80.7  --  84.3  --   PLT 126*  < > 140* 95* 95* 78*  --  72* 75*  < > = values in this interval not displayed.  Cardiac Enzymes: No results for input(s): CKTOTAL, CKMB, CKMBINDEX, TROPONINI in the last 168 hours.  BNP: Invalid input(s): POCBNP  CBG:  Recent Labs Lab 12/14/15 0935 12/14/15 1006 12/14/15 1140 12/14/15 1232 12/14/15 1638  GLUCAP 73 137* 57* 25* 202*    Microbiology: Results for orders placed or performed during the hospital encounter of 12/16/2015  Blood culture (routine x 2)     Status: None   Collection Time:  12/31/2015 11:15 PM  Result Value Ref Range Status   Specimen Description BLOOD RIGHT WRIST  Final   Special Requests BOTTLES DRAWN AEROBIC AND ANAEROBIC [place]  Final   Culture NO GROWTH 5 DAYS  Final   Report Status 12/14/2015 FINAL  Final  Blood culture (routine x 2)     Status: None   Collection Time: 12/21/2015 11:31 PM  Result Value Ref Range Status   Specimen Description BLOOD LEFT FOREARM  Final   Special Requests BOTTLES DRAWN AEROBIC AND ANAEROBIC [place]  Final   Culture NO GROWTH 5 DAYS  Final   Report Status January 09, 2016 FINAL  Final  Aerobic/Anaerobic Culture (surgical/deep wound)     Status: None (Preliminary result)   Collection Time: 12/10/15 10:09 AM  Result Value Ref Range Status   Specimen Description DECUBITIS  Final   Special Requests Immunocompromised  Final   Gram Stain   Final    DEGENERATED CELLULAR MATERIAL PRESENT FEW SQUAMOUS EPITHELIAL CELLS PRESENT ABUNDANT GRAM NEGATIVE RODS MODERATE GRAM POSITIVE RODS FEW GRAM POSITIVE COCCI IN PAIRS Performed at [facility]    Culture   Final    ABUNDANT PROTEUS MIRABILIS MODERATE ESCHERICHIA COLI FEW ENTEROCOCCUS FAECIUM    Report Status PENDING  Incomplete   Organism ID, Bacteria PROTEUS MIRABILIS  Final   Organism ID, Bacteria ESCHERICHIA COLI  Final      Susceptibility   Escherichia coli - MIC*    AMPICILLIN 8 SENSITIVE Sensitive     CEFAZOLIN <=4 SENSITIVE Sensitive     CEFEPIME <=1 SENSITIVE Sensitive     CEFTAZIDIME <=1 SENSITIVE Sensitive     CEFTRIAXONE <=1 SENSITIVE Sensitive     CIPROFLOXACIN <=0.25 SENSITIVE Sensitive     GENTAMICIN <=1 SENSITIVE Sensitive     IMIPENEM <=0.25 SENSITIVE Sensitive     TRIMETH/SULFA <=20 SENSITIVE Sensitive     AMPICILLIN/SULBACTAM 4 SENSITIVE Sensitive     PIP/TAZO <=4 SENSITIVE Sensitive     Extended ESBL NEGATIVE Sensitive     * MODERATE ESCHERICHIA COLI   Proteus mirabilis - MIC*    AMPICILLIN <=2 SENSITIVE Sensitive     CEFAZOLIN <=4  SENSITIVE Sensitive     CEFEPIME <=1 SENSITIVE Sensitive     CEFTAZIDIME <=1 SENSITIVE Sensitive     CEFTRIAXONE <=1 SENSITIVE Sensitive     CIPROFLOXACIN >=4 RESISTANT Resistant     GENTAMICIN <=1 SENSITIVE Sensitive     IMIPENEM 0.5 SENSITIVE Sensitive     TRIMETH/SULFA >=320 RESISTANT Resistant     AMPICILLIN/SULBACTAM <=2 SENSITIVE Sensitive     PIP/TAZO <=4 SENSITIVE Sensitive     * ABUNDANT PROTEUS MIRABILIS  Aerobic Culture (superficial specimen)     Status: None   Collection Time: 12/10/15  1:34 PM  Result Value Ref Range Status   Specimen Description HEMODIALYSIS FISTULA  Final   Special Requests LEFT AVF  Final   Gram Stain   Final    MODERATE WBC PRESENT,BOTH PMN AND MONONUCLEAR NO ORGANISMS SEEN    Culture   Final    NO GROWTH 2 DAYS Performed at [facility]    Report Status 12/13/2015 FINAL  Final  Culture, blood (routine x 2)     Status: None   Collection Time: 12/10/15  3:24 PM  Result Value Ref Range Status   Specimen Description BLOOD LEFT HAND  Final   Special Requests   Final    BOTTLES DRAWN AEROBIC AND ANAEROBIC  AER 6CC ANA 1CC   Culture NO GROWTH 5 DAYS  Final   Report Status 18-Dec-2015 FINAL  Final  Culture, blood (routine x 2)     Status: None   Collection Time: 12/10/15  3:33 PM  Result Value Ref Range Status   Specimen Description BLOOD RIGHT HAND  Final   Special Requests BOTTLES DRAWN AEROBIC ONLY  2CC  Final   Culture NO GROWTH 5 DAYS  Final   Report Status 12/18/2015 FINAL  Final  MRSA PCR Screening     Status: None   Collection Time: 12/24/2015  2:23 PM  Result Value Ref Range Status   MRSA by PCR NEGATIVE NEGATIVE Final    Comment:        The GeneXpert MRSA Assay (FDA approved for NASAL specimens only), is one component of a comprehensive MRSA colonization surveillance program. It is not intended to diagnose MRSA infection nor to guide or monitor treatment for MRSA infections.   Culture, expectorated sputum-assessment      Status: None   Collection Time: 12/12/15  6:48 AM  Result Value Ref Range Status   Specimen Description TRACHEAL ASPIRATE  Final   Special Requests NONE  Final   Sputum evaluation   Final    TEST NOT PERFORMED ON TRACH ASP. CREDITED, ORDERED RESP CULTURE SDR 12/12/15    Report Status 12/12/2015 FINAL  Final  Culture, respiratory (NON-Expectorated)     Status: None   Collection Time: 12/12/15  6:48 AM  Result Value Ref Range Status   Specimen Description TRACHEAL ASPIRATE  Final   Special Requests NONE  Final   Gram Stain   Final    NO WBC SEEN RARE HYPHAL ELEMENTS SEEN Performed at [facility]  Final   Report Status 12/14/2015 FINAL  Final  CULTURE, BLOOD (ROUTINE X 2) w Reflex to ID Panel     Status: None (Preliminary result)   Collection Time: 12/13/15  4:52 PM  Result Value Ref Range Status   Specimen Description BLOOD FEMORAL LEFT CATHETER  Final   Special Requests BOTTLES DRAWN AEROBIC AND ANAEROBIC [place]  Final   Culture NO GROWTH 2 DAYS  Final   Report Status PENDING  Incomplete  CULTURE, BLOOD (ROUTINE X 2) w Reflex to ID Panel     Status: None (Preliminary result)   Collection Time: 12/13/15  4:53 PM  Result Value Ref Range Status   Specimen Description BLOOD T LUMEN FEMORAL  Final   Special Requests BOTTLES DRAWN AEROBIC AND ANAEROBIC 6CC  Final   Culture NO GROWTH 2 DAYS  Final   Report Status PENDING  Incomplete    Coagulation Studies:  Recent Labs  12-18-2015 1020  LABPROT 17.2*  INR 1.39    Urinalysis: No results for input(s): COLORURINE, LABSPEC, PHURINE, GLUCOSEU, HGBUR, BILIRUBINUR, KETONESUR, PROTEINUR, UROBILINOGEN, NITRITE, LEUKOCYTESUR  in the last 168 hours.  Invalid input(s): APPERANCEUR  Lipid Panel:  No results found for: CHOL, TRIG, HDL, CHOLHDL, VLDL, LDLCALC  HgbA1C: No results found for: HGBA1C  Urine Drug Screen:  No results found for: LABOPIA, COCAINSCRNUR, LABBENZ, AMPHETMU, THCU, LABBARB   Alcohol Level: No results for input(s): ETH in the last 168 hours.  Other results: EKG: paced rhythm at 72 bpm  Imaging: Dg Chest 1 View  Result Date: 12-31-2015 CLINICAL DATA:  Hypotension, possible sepsis EXAM: CHEST 1 VIEW COMPARISON:  Portable chest x-ray of 12/13/2015 FINDINGS: The lungs are not well aerated and there has been in increase in opacity at the right lung base suspicious for developing pneumonia as well as atelectasis. The left lung is clear. Endotracheal tube tip is approximately 4.4 cm above the carina. Cardiomegaly stable and a dual lead permanent pacemaker remains. 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 IMPRESSION: 1. New opacity at the right lung base suspicious for pneumonia. 2. Endotracheal tube tip 4.4 cm above the carina. 3. Stable cardiomegaly with permanent pacemaker. Electronically Signed   By: Ivar Drape M.D.   On: 12-31-2015 10:07   Dg Abd 1 View  Result Date: Dec 31, 2015 CLINICAL DATA:  Constipation EXAM: ABDOMEN - 1 VIEW COMPARISON:  Abdomen films of 12/13/2015, and CT abdomen pelvis of 11/28/2015 FINDINGS: NG tube is present with the tip overlying the region of the distal body the stomach. No bowel obstruction is seen. A moderate amount of feces is noted throughout the colon. Rounded calcification overlying the lower mid abdomen is consistent with abdominal aortic aneurysm measuring approximately 5.6 cm in diameter. On the CT of 11/28/2015, this area also measured 5.6 cm. Vascular surgery consultation recommended due to increased risk of rupture for AAA >5.5 cm. This recommendation follows ACR consensus. This recommendation follows ACR consensus guidelines: White Paper of the ACR Incidental Findings Committee II on Vascular Findings. J Am Coll Radiol 2013; 10:789-794. Venous catheters are noted in both the right and left groin extending overlying the upper sacrum bilaterally. IMPRESSION: 1. 5.6 cm distal abdominal aortic aneurysm. Vascular surgery  consultation recommended due to increased risk of rupture for AAA >5.5 cm. This recommendation follows ACR consensus. This recommendation follows ACR consensus guidelines: White Paper of the ACR Incidental Findings Committee II on Vascular Findings. J Am Coll Radiol 2013; 10:789-794. 2. Moderate amount of feces throughout the colon. No bowel obstruction. 3. Bilateral femoral venous catheters. 4. NG tube tube overlies the distal body of the stomach. Electronically Signed   By: Ivar Drape M.D.   On: December 31, 2015 10:10   Ct Head Wo Contrast  Result Date: 2015-12-31 CLINICAL DATA:  Cardiac arrest EXAM: CT HEAD WITHOUT CONTRAST TECHNIQUE: Contiguous axial images were obtained from the base of the skull through the vertex without intravenous contrast. COMPARISON:  CT 12/13/2015 FINDINGS: Brain: Generalized atrophy. Chronic microvascular ischemic changes in the white matter stable. Chronic left frontal infarct stable. Negative for acute infarct. Negative for hemorrhage or mass. No evidence of hypoxic encephalopathy. No shift of the midline structures Vascular: Extensive arterial calcification. Skull: Negative Sinuses/Orbits: Bilateral ocular implants.  Paranasal sinuses clear. Other: None IMPRESSION: No acute abnormality and no change from 2 days ago. Electronically Signed   By: Franchot Gallo M.D.   On: 2015/12/31 09:23     Assessment/Plan: 71 year old male with multiple medical problems admitted with sepsis.  Hospitalization has been complicated by hypotension, GIB hypoxia and cardiac arrest requiring 10 minutes of resuscitative efforts until return of  ROSC.  Patient has not returned to premorbid mental status.  There are multiple medical issues present including hyponatremia, infection with elevated wbc count, renal failure without patient being able to tolerate full HD, hypotension and anemia that are likely all contributing to produce a metabolic encephalopathy.  Initial head CT shows no acute changes.  This  was repeated today and again still shows no acute changes.  Patient unable to have a MRI due to pacemaker.  Unfortunately I believe that the patient's prognosis is less related to whatever cerebral damage there is and more related to his multiple medical issues, most of which can not be adequately addressed due to his hypotension that has not dramatically improved with pressors.  Prognosis is poor.    Alexis Goodell, MD Neurology [phone] 12-24-2015, 1:55 PM

## 2016-01-02 NOTE — Progress Notes (Signed)
[facility]  ROUNDING NOTE   Subjective:   12/8- Patient presented from nursing home via EMS for hypotension. Found to have large decubitus ulcer 12/10- Blood per rectum, transfused 3 units PRBC, EGD- Grade D reflux esophagitis,severe ulcerative esophagitis,  gastric ulcer, intubated, CRRT started   12/11- CRRT paused due to clot in venous line. Could not be restarted  112/11- coded at night.CPR done Jan 05, 2023- intermittent HD - tolerated OK. BFR 200 via catheter 12/13- cathter not working. AV Access used for dialysis but treatment stopped shortly due to drop in BP Remains on Nor EPI and Vasopressin and dose had to be increased  Objective:  Vital signs in last 24 hours:  Temp:  [97.8 F (36.6 C)-103 F (39.4 C)] 98.2 F (36.8 C) (12/14 0800) Pulse Rate:  [27-89] 28 (12/13 2300) Resp:  [24-32] 25 (12/14 1100) BP: (32-156)/(12-48) 42/25 (12/14 1100) SpO2:  [77 %-100 %] 77 % (12/13 2300) Arterial Line BP: (118-195)/(23-53) 152/42 (12/14 1100) FiO2 (%):  [40 %] 40 % (12/14 1133) Weight:  [94.9 kg (209 lb 3.5 oz)-96.3 kg (212 lb 4.9 oz)] 96.3 kg (212 lb 4.9 oz) (12/14 0447)  Weight change: 6.3 kg (13 lb 14.2 oz) Filed Weights   12/14/15 1900 12/14/15 2050 01-07-16 0447  Weight: 95.2 kg (209 lb 14.1 oz) 94.9 kg (209 lb 3.5 oz) 96.3 kg (212 lb 4.9 oz)    Intake/Output: I/O last 3 completed shifts: In: 4567.4 [I.V.:2933; Blood:358; NG/GT:976.3; IV Piggyback:300] Out: 476 [Emesis/NG output:100; Other:376]   Intake/Output this shift:  Total I/O In: 595.5 [I.V.:375.5; NG/GT:170; IV Piggyback:50] Out: -   Physical Exam: General: No acute distress  Head: Normocephalic, atraumatic. ETT  Eyes: Anicteric  Neck: No masses  Lungs:  Ventilator dependent  Heart: paced  Abdomen:  Soft, nontender, obese  Extremities: Bilateral BKA, generalized edema  Neurologic: Sedated, intubated  Skin: No lesions  Access: RUE AVF - ulcerations - covered with bandage    Basic Metabolic  Panel:  Recent Labs Lab January 05, 2016 2310 12/14/15 0200 12/14/15 0400 12/14/15 0630  12/14/15 1747 12/14/15 1930 12/14/15 2305 2016-01-07 0400 01-07-16 0740 01-07-16 1020  NA 132* 133* 132* 133*  --  130*  --   --  133*  --   --   K 4.7 4.4 4.4 4.7  --  5.0  --   --  4.6  --   --   CL 100* 102 99* 99*  --  98*  --   --  99*  --   --   CO2 18* 18* 20* 19*  --  14*  --   --  16*  --   --   GLUCOSE 115* 96 104* 112*  < > 200* 227* 178* 200*  204* 165* 113*  BUN 43* 42* 44* 45*  --  47*  --   --  43*  --   --   CREATININE 3.56* 3.32* 3.58* 3.54*  --  3.79*  --   --  3.26*  --   --   CALCIUM 9.0 8.7* 9.0 9.0  --  9.0  --   --  9.0  --   --   MG 1.8 1.7  --  1.9  --  1.9  --   --  2.1  --   --   PHOS 4.0 3.8  --  3.8  --  4.7*  --   --  5.3*  --   --   < > = values in this interval  not displayed.  Liver Function Tests:  Recent Labs Lab 12/13/15 2310 12/14/15 0200 12/14/15 0630 12/14/15 1747 Dec 28, 2015 0400  ALBUMIN 1.3* 1.2* 1.2* 1.2* 1.4*   No results for input(s): LIPASE, AMYLASE in the last 168 hours. No results for input(s): AMMONIA in the last 168 hours.  CBC:  Recent Labs Lab 12/16/2015 2315  12/12/15 0447 12/12/15 1826 12/13/15 0415 12/14/15 0400 12/14/15 1259 28-Dec-2015 0400  WBC 21.7*  < > 53.0* 39.9* 34.5* 37.3*  --  39.5*  NEUTROABS 19.4*  --   --   --  26.2* 28.7*  --  34.8*  HGB 8.7*  < > 9.9* 7.3* 7.9* 6.4* 7.6* 7.1*  HCT 27.0*  < > 31.5* 23.6* 23.8* 19.8* 23.6* 22.4*  MCV 79.7*  < > 82.8 81.8 79.8* 80.7  --  84.3  PLT 126*  < > 140* 95* 95* 78*  --  72*  < > = values in this interval not displayed.  Cardiac Enzymes: No results for input(s): CKTOTAL, CKMB, CKMBINDEX, TROPONINI in the last 168 hours.  BNP: Invalid input(s): POCBNP  CBG:  Recent Labs Lab 12/14/15 0935 12/14/15 1006 12/14/15 1140 12/14/15 1232 12/14/15 1638  GLUCAP 73 137* 57* 55* 202*    Microbiology: Results for orders placed or performed during the hospital encounter of  12/26/2015  Blood culture (routine x 2)     Status: None   Collection Time: 12/30/2015 11:15 PM  Result Value Ref Range Status   Specimen Description BLOOD RIGHT WRIST  Final   Special Requests BOTTLES DRAWN AEROBIC AND ANAEROBIC [place]  Final   Culture NO GROWTH 5 DAYS  Final   Report Status 12/14/2015 FINAL  Final  Blood culture (routine x 2)     Status: None   Collection Time: 12/05/2015 11:31 PM  Result Value Ref Range Status   Specimen Description BLOOD LEFT FOREARM  Final   Special Requests BOTTLES DRAWN AEROBIC AND ANAEROBIC [place]  Final   Culture NO GROWTH 5 DAYS  Final   Report Status 12-28-2015 FINAL  Final  Aerobic/Anaerobic Culture (surgical/deep wound)     Status: None (Preliminary result)   Collection Time: 12/10/15 10:09 AM  Result Value Ref Range Status   Specimen Description DECUBITIS  Final   Special Requests Immunocompromised  Final   Gram Stain   Final    DEGENERATED CELLULAR MATERIAL PRESENT FEW SQUAMOUS EPITHELIAL CELLS PRESENT ABUNDANT GRAM NEGATIVE RODS MODERATE GRAM POSITIVE RODS FEW GRAM POSITIVE COCCI IN PAIRS Performed at [facility]    Culture   Final    ABUNDANT PROTEUS MIRABILIS MODERATE ESCHERICHIA COLI NO ANAEROBES ISOLATED; CULTURE IN PROGRESS FOR 5 DAYS    Report Status PENDING  Incomplete   Organism ID, Bacteria PROTEUS MIRABILIS  Final   Organism ID, Bacteria ESCHERICHIA COLI  Final      Susceptibility   Escherichia coli - MIC*    AMPICILLIN 8 SENSITIVE Sensitive     CEFAZOLIN <=4 SENSITIVE Sensitive     CEFEPIME <=1 SENSITIVE Sensitive     CEFTAZIDIME <=1 SENSITIVE Sensitive     CEFTRIAXONE <=1 SENSITIVE Sensitive     CIPROFLOXACIN <=0.25 SENSITIVE Sensitive     GENTAMICIN <=1 SENSITIVE Sensitive     IMIPENEM <=0.25 SENSITIVE Sensitive     TRIMETH/SULFA <=20 SENSITIVE Sensitive     AMPICILLIN/SULBACTAM 4 SENSITIVE Sensitive     PIP/TAZO <=4 SENSITIVE Sensitive     Extended ESBL NEGATIVE Sensitive     * MODERATE  ESCHERICHIA COLI   Proteus  mirabilis - MIC*    AMPICILLIN <=2 SENSITIVE Sensitive     CEFAZOLIN <=4 SENSITIVE Sensitive     CEFEPIME <=1 SENSITIVE Sensitive     CEFTAZIDIME <=1 SENSITIVE Sensitive     CEFTRIAXONE <=1 SENSITIVE Sensitive     CIPROFLOXACIN >=4 RESISTANT Resistant     GENTAMICIN <=1 SENSITIVE Sensitive     IMIPENEM 0.5 SENSITIVE Sensitive     TRIMETH/SULFA >=320 RESISTANT Resistant     AMPICILLIN/SULBACTAM <=2 SENSITIVE Sensitive     PIP/TAZO <=4 SENSITIVE Sensitive     * ABUNDANT PROTEUS MIRABILIS  Aerobic Culture (superficial specimen)     Status: None   Collection Time: 12/10/15  1:34 PM  Result Value Ref Range Status   Specimen Description HEMODIALYSIS FISTULA  Final   Special Requests LEFT AVF  Final   Gram Stain   Final    MODERATE WBC PRESENT,BOTH PMN AND MONONUCLEAR NO ORGANISMS SEEN    Culture   Final    NO GROWTH 2 DAYS Performed at [facility]    Report Status 12/13/2015 FINAL  Final  Culture, blood (routine x 2)     Status: None   Collection Time: 12/10/15  3:24 PM  Result Value Ref Range Status   Specimen Description BLOOD LEFT HAND  Final   Special Requests   Final    BOTTLES DRAWN AEROBIC AND ANAEROBIC  AER 6CC ANA 1CC   Culture NO GROWTH 5 DAYS  Final   Report Status 12-19-15 FINAL  Final  Culture, blood (routine x 2)     Status: None   Collection Time: 12/10/15  3:33 PM  Result Value Ref Range Status   Specimen Description BLOOD RIGHT HAND  Final   Special Requests BOTTLES DRAWN AEROBIC ONLY  2CC  Final   Culture NO GROWTH 5 DAYS  Final   Report Status 12/19/15 FINAL  Final  MRSA PCR Screening     Status: None   Collection Time: 12/14/2015  2:23 PM  Result Value Ref Range Status   MRSA by PCR NEGATIVE NEGATIVE Final    Comment:        The GeneXpert MRSA Assay (FDA approved for NASAL specimens only), is one component of a comprehensive MRSA colonization surveillance program. It is not intended to diagnose MRSA infection  nor to guide or monitor treatment for MRSA infections.   Culture, expectorated sputum-assessment     Status: None   Collection Time: 12/12/15  6:48 AM  Result Value Ref Range Status   Specimen Description TRACHEAL ASPIRATE  Final   Special Requests NONE  Final   Sputum evaluation   Final    TEST NOT PERFORMED ON TRACH ASP. CREDITED, ORDERED RESP CULTURE SDR 12/12/15    Report Status 12/12/2015 FINAL  Final  Culture, respiratory (NON-Expectorated)     Status: None   Collection Time: 12/12/15  6:48 AM  Result Value Ref Range Status   Specimen Description TRACHEAL ASPIRATE  Final   Special Requests NONE  Final   Gram Stain   Final    NO WBC SEEN RARE HYPHAL ELEMENTS SEEN Performed at [place]  Final   Report Status 12/14/2015 FINAL  Final  CULTURE, BLOOD (ROUTINE X 2) w Reflex to ID Panel     Status: None (Preliminary result)   Collection Time: 12/13/15  4:52 PM  Result Value Ref Range Status   Specimen Description BLOOD FEMORAL LEFT CATHETER  Final   Special Requests BOTTLES  DRAWN AEROBIC AND ANAEROBIC [place]  Final   Culture NO GROWTH 2 DAYS  Final   Report Status PENDING  Incomplete  CULTURE, BLOOD (ROUTINE X 2) w Reflex to ID Panel     Status: None (Preliminary result)   Collection Time: 12/13/15  4:53 PM  Result Value Ref Range Status   Specimen Description BLOOD T LUMEN FEMORAL  Final   Special Requests BOTTLES DRAWN AEROBIC AND ANAEROBIC 6CC  Final   Culture NO GROWTH 2 DAYS  Final   Report Status PENDING  Incomplete    Coagulation Studies: No results for input(s): LABPROT, INR in the last 72 hours.  Urinalysis: No results for input(s): COLORURINE, LABSPEC, PHURINE, GLUCOSEU, HGBUR, BILIRUBINUR, KETONESUR, PROTEINUR, UROBILINOGEN, NITRITE, LEUKOCYTESUR in the last 72 hours.  Invalid input(s): APPERANCEUR    Imaging: Dg Chest 1 View  Result Date: 12-22-2015 CLINICAL DATA:  Hypotension, possible sepsis EXAM: CHEST 1 VIEW  COMPARISON:  Portable chest x-ray of 12/13/2015 FINDINGS: The lungs are not well aerated and there has been in increase in opacity at the right lung base suspicious for developing pneumonia as well as atelectasis. The left lung is clear. Endotracheal tube tip is approximately 4.4 cm above the carina. Cardiomegaly stable and a dual lead permanent pacemaker remains. 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 IMPRESSION: 1. New opacity at the right lung base suspicious for pneumonia. 2. Endotracheal tube tip 4.4 cm above the carina. 3. Stable cardiomegaly with permanent pacemaker. Electronically Signed   By: Ivar Drape M.D.   On: 12/22/15 10:07   Dg Abd 1 View  Result Date: 2015/12/22 CLINICAL DATA:  Constipation EXAM: ABDOMEN - 1 VIEW COMPARISON:  Abdomen films of 12/13/2015, and CT abdomen pelvis of 11/28/2015 FINDINGS: NG tube is present with the tip overlying the region of the distal body the stomach. No bowel obstruction is seen. A moderate amount of feces is noted throughout the colon. Rounded calcification overlying the lower mid abdomen is consistent with abdominal aortic aneurysm measuring approximately 5.6 cm in diameter. On the CT of 11/28/2015, this area also measured 5.6 cm. Vascular surgery consultation recommended due to increased risk of rupture for AAA >5.5 cm. This recommendation follows ACR consensus. This recommendation follows ACR consensus guidelines: White Paper of the ACR Incidental Findings Committee II on Vascular Findings. J Am Coll Radiol 2013; 10:789-794. Venous catheters are noted in both the right and left groin extending overlying the upper sacrum bilaterally. IMPRESSION: 1. 5.6 cm distal abdominal aortic aneurysm. Vascular surgery consultation recommended due to increased risk of rupture for AAA >5.5 cm. This recommendation follows ACR consensus. This recommendation follows ACR consensus guidelines: White Paper of the ACR Incidental Findings Committee II on Vascular  Findings. J Am Coll Radiol 2013; 10:789-794. 2. Moderate amount of feces throughout the colon. No bowel obstruction. 3. Bilateral femoral venous catheters. 4. NG tube tube overlies the distal body of the stomach. Electronically Signed   By: Ivar Drape M.D.   On: 12-22-2015 10:10   Dg Abd 1 View  Result Date: 12/13/2015 CLINICAL DATA:  71 year old male enteric tube placement. Initial encounter. EXAM: ABDOMEN - 1 VIEW COMPARISON:  01/01/2016. FINDINGS: Portable AP views at 1215 hours. Enteric tube placed, side hole to level of the proximal gastric body. New pacing or resuscitation pads over the lower chest and right upper abdomen. Partially visible left chest cardiac pacemaker. Stable visible mediastinal contours. Prior CABG. Extensive Aortoiliac calcified atherosclerosis noted. Large infrarenal abdominal aortic aneurysm, diameter up to 56 mm  as seen on the recent CT Abdomen and Pelvis 11/28/2015. Bilateral femoral approach vascular catheters in place, that on the right is stable since 12/22/2015. New probably dual lumen catheter on the left tip projecting at the level of the proximal left iliac veins. Stable pelvic surgical clips. Osteopenia. Three small clustered metallic foreign bodies again project over the greater curve of the stomach an or present on the prior CT. Non obstructed bowel gas pattern. IMPRESSION: 1. Enteric tube placed, side hole to level of the proximal stomach. 2. Bilateral pelvic vascular catheters, a new dual-lumen appearing catheter on the left since 12/27/2015 with tip at the level of the proximal iliac veins. 3. Extensive Aortoiliac calcified atherosclerosis with infrarenal abdominal aortic aneurysm, diameter 56 mm on recent CT. 4. Non obstructed bowel gas pattern. Chronic small retained metallic foreign bodies in the distal stomach. Electronically Signed   By: Genevie Ann M.D.   On: 12/13/2015 12:33   Ct Head Wo Contrast  Result Date: 12-22-2015 CLINICAL DATA:  Cardiac arrest EXAM: CT  HEAD WITHOUT CONTRAST TECHNIQUE: Contiguous axial images were obtained from the base of the skull through the vertex without intravenous contrast. COMPARISON:  CT 12/13/2015 FINDINGS: Brain: Generalized atrophy. Chronic microvascular ischemic changes in the white matter stable. Chronic left frontal infarct stable. Negative for acute infarct. Negative for hemorrhage or mass. No evidence of hypoxic encephalopathy. No shift of the midline structures Vascular: Extensive arterial calcification. Skull: Negative Sinuses/Orbits: Bilateral ocular implants.  Paranasal sinuses clear. Other: None IMPRESSION: No acute abnormality and no change from 2 days ago. Electronically Signed   By: Franchot Gallo M.D.   On: 12-22-15 09:23     Medications:   . amiodarone 30 mg/hr (12/22/2015 1100)  . epinephrine Stopped (12/12/15 0547)  . fentaNYL infusion INTRAVENOUS Stopped (12/13/15 0730)  . norepinephrine (LEVOPHED) Adult infusion 47.04 mcg/min (December 22, 2015 1100)  . vasopressin (PITRESSIN) infusion - *FOR SHOCK* 0.04 Units/min (12/22/15 1100)   . sodium chloride   Intravenous Once  . brimonidine  1 drop Left Eye TID  . chlorhexidine gluconate (MEDLINE KIT)  15 mL Mouth Rinse BID  . epoetin (EPOGEN/PROCRIT) injection  10,000 Units Intravenous Q T,Th,Sa-HD  . feeding supplement (VITAL HIGH PROTEIN)  1,000 mL Per Tube Q24H  . insulin aspart  0-15 Units Subcutaneous Q4H  . mouth rinse  15 mL Mouth Rinse 10 times per day  . pantoprazole  40 mg Intravenous Q12H  . piperacillin-tazobactam (ZOSYN)  IV  3.375 g Intravenous Q12H  . prednisoLONE acetate  2 drop Both Eyes BID  . sodium chloride flush  10-40 mL Intracatheter Q12H  . sodium hypochlorite   Irrigation BID  . vancomycin  1,000 mg Intravenous Q T,Th,Sa-HD   acetaminophen **OR** acetaminophen, bisacodyl, fentaNYL, heparin, ipratropium-albuterol, midazolam, midazolam, nitroGLYCERIN, [DISCONTINUED] ondansetron **OR** ondansetron (ZOFRAN) IV, polyvinyl alcohol, sodium  chloride, sodium chloride flush  Assessment/ Plan:  71 y.o. male with a PMHx of ESRD on HD at the [facility], anemia chronic kidney disease, secondary hyperparathyroidism, aortic aneurysm, bilateral below the knee amputation, peptic ulcer disease  TTS [facility] AVF  1.  ESRD on HD:   -  Catheter clotted - ICU team will attempt to replace - did not tolerate HD yesterday due to hypotension - restart CRRT once access available  2. Sepsis: source seems to be from infected decubitus ulcer. With hypotension and leukocytosis - abx as per ICU team  3.  Anemia chronic kidney disease: hemoglobin 7.1. With thrombocytopenia - epo with HD treatment.  4.  Secondary hyperparathyroidism: PTH 156 on 11/29/15. Calcium and phosphorus at goal.  - Cinacalcet - sevelamer Phos 5.3  5. Acute resp failure Requiring mechanical ventilation   LOS: 5 Jaiyah Beining 2017/12/2009:39 AM

## 2016-01-02 NOTE — Progress Notes (Signed)
Attempted to exchange left femoral dialysis catheter, however blood return absent in both ports when new catheter placed. Therefore catheter removed ultrasound utilized to view vessels to determine if a new dialysis catheter could be inserted however due to questionable blood clot in vessel procedure aborted.  Notified Dr. Candiss Norse and Dr. Lucky Cowboy.  Marda Stalker, [place] Pager [phone] (please enter 7 digits) PCCM Consult Pager [phone] (please enter 7 digits)

## 2016-01-02 NOTE — Progress Notes (Signed)
[facility] hospital provider Aline Brochure called for an update about pts status. After speaking to provider placed order for chem 7 and DIC panel.  She stated she would speak with her fellow about bed availability and would call back to discuss plan of care again later today.  Marda Stalker, [place] Pager [phone] (please enter 7 digits) PCCM Consult Pager [phone] (please enter 7 digits)

## 2016-01-02 NOTE — Progress Notes (Addendum)
2000 Pt lost pulses, code blue called. Pt coded for 15 minutes. Pt then found to have faint pulses and ultrasound showed some cardiac activity. At 2020 pt again lost pulses and CPR restarted. Pt already maxed out on all pressors and on amio drip. Family arrived and asked that CPR be stopped. Code called at 2036. See code sheet for additional documentation. Assessment incomplete due to pt coding.

## 2016-01-02 NOTE — Progress Notes (Signed)
PULMONARY / CRITICAL CARE MEDICINE   Name: Gregory Livingston MRN: [id number] DOB: June 27, 1945    ADMISSION DATE:  12/31/2015 CONSULTATION DATE:  12/03/2015  REFERRING MD:  Dr. Posey Pronto   CHIEF COMPLAINT:  Cardiac arrest  HISTORY OF PRESENT ILLNESS:   This is a 71 y/o AA male, SNF resident with a significant medical history who presented to the ED via EMS with hypotension. EMS was called because SNF staff were concerned that patient is septic. He was found to have a significantly elevated WBC hence he was admitted and treatment for sepsis was initiated. Possible source was thought to be his sacral decubitus ulcer. 12/29/2015 patient was noted to have a large blood stools that evolved to just bright red blood with multiple clots from the rectum. He was transfused 3 units of packed red blood cells and a bedside upper GI endoscopy was performed. Postprocedure, patient became hypoxic, hypotensive and subsequently went into cardiac arrest. He was resuscitated for approximately 10 minutes with return of spontaneous circulation. PCCM was consulted for further management.  His EGD showed severe ulcerative colitis with a massive the stock clean-based gastric ulcer. Rectal bleeding has improved. The patient continues to be severely hypotensive requiring multiple pressors and IV fluids. He has end-stage renal disease and is due for dialysis. However, given his low blood pressure, Nephrology has decided to proceed with continuous renal replacement therapy   PAST MEDICAL HISTORY :  He  has a past medical history of AAA (abdominal aortic aneurysm) ([place]); CHF (congestive heart failure) ([place]); Diabetes mellitus without complication ([place]); Dialysis patient [facility]); Hyperparathyroidism ([place]); Hypertension; Multiple myeloma ([place]); Peptic ulcer with perforation ([place]); and PVD (peripheral vascular disease) ([place]).  PAST SURGICAL HISTORY: He  has a past surgical history that includes Below knee leg amputation (Bilateral); pacemaker  placement; AV fistula placement; bilateral below knee amputation; and Esophagogastroduodenoscopy (egd) with propofol (N/A, 12/26/2015).  Allergies  Allergen Reactions  . Ivp Dye [Iodinated Diagnostic Agents]     No current facility-administered medications on file prior to encounter.    Current Outpatient Prescriptions on File Prior to Encounter  Medication Sig  . acetaminophen (TYLENOL) 325 MG tablet Take 975 mg by mouth every 8 (eight) hours as needed.  Marland Kitchen albuterol (PROVENTIL HFA;VENTOLIN HFA) 108 (90 Base) MCG/ACT inhaler Inhale 2 puffs into the lungs every 6 (six) hours as needed for shortness of breath.  Marland Kitchen atorvastatin (LIPITOR) 40 MG tablet Take 40 mg by mouth at bedtime.  . B Complex-C-Folic Acid (RENAL VITAMIN PO) Take 1 tablet by mouth daily.  . bacitracin 500 UNIT/GM ointment Apply 1 application topically daily. Apply small amount topically to left knee abrasion daily  . brimonidine (ALPHAGAN) 0.2 % ophthalmic solution Place 1 drop into the left eye 3 (three) times daily.  . calcium-vitamin D (OSCAL WITH D) 500-200 MG-UNIT tablet Take 1 tablet by mouth 2 (two) times daily.  . carvedilol (COREG) 25 MG tablet Take 12.5 mg by mouth every 12 (twelve) hours.   . cinacalcet (SENSIPAR) 60 MG tablet Take 120 mg by mouth daily. Take 120 mg every evening with food.  . Difluprednate (DUREZOL) 0.05 % EMUL Place 1 drop into both eyes daily.  . folic acid (FOLVITE) 1 MG tablet Take 2 mg by mouth daily.  Marland Kitchen gabapentin (NEURONTIN) 100 MG capsule Take 200 mg by mouth at bedtime.  . hydroxypropyl methylcellulose / hypromellose (ISOPTO TEARS / GONIOVISC) 2.5 % ophthalmic solution Place 1 drop into both eyes 4 (four) times daily as needed for dry eyes.  Marland Kitchen  isosorbide mononitrate (IMDUR) 120 MG 24 hr tablet Take 1 tablet (120 mg total) by mouth daily. (Patient taking differently: Take 240 mg by mouth daily. )  . nitroGLYCERIN (NITROSTAT) 0.4 MG SL tablet Place 0.4 mg under the tongue every 5 (five)  minutes as needed for chest pain.  . Nutritional Supplements (FEEDING SUPPLEMENT, NEPRO CARB STEADY,) LIQD Take 237 mLs by mouth 3 (three) times daily.  Marland Kitchen oxyCODONE (OXY IR/ROXICODONE) 5 MG immediate release tablet Take 5 mg by mouth every 4 (four) hours as needed for severe pain.  . pantoprazole (PROTONIX) 40 MG tablet Take 40 mg by mouth 2 (two) times daily.  . ranolazine (RANEXA) 500 MG 12 hr tablet Take 500 mg by mouth 2 (two) times daily.  Marland Kitchen senna (SENOKOT) 8.6 MG TABS tablet Take 2 tablets by mouth daily.  . sevelamer carbonate (RENVELA) 800 MG tablet Take 2,400 mg by mouth 3 (three) times daily with meals.  Marland Kitchen aspirin 81 MG chewable tablet Chew 81 mg by mouth daily.  . clopidogrel (PLAVIX) 75 MG tablet Take 75 mg by mouth daily.  . Heparin Sodium, Porcine, PF 5000 UNIT/0.5ML SOLN Inject 0.5 mLs as directed every 8 (eight) hours.    FAMILY HISTORY:  His indicated that his mother is deceased. He indicated that his father is deceased. He indicated that the status of his other is unknown.    SOCIAL HISTORY: He  reports that he has quit smoking. He has never used smokeless tobacco. He reports that he drinks alcohol. He reports that he does not use drugs.  REVIEW OF SYSTEMS:   Unable to obtain as the patient is intubated and on mechanical ventilation  SUBJECTIVE:  Patient remains intubated and on mechanical ventillation, on pressors.  Was afebrile during night.  Opens eyes spontaneously but does not follow any commands.  Occasionally responds to his name by looking. No major issues overnight.  VITAL SIGNS: BP (!) 71/13   Pulse (!) 28   Temp 98.2 F (36.8 C) (Oral)   Resp (!) 28   Ht '5\' 1"'$  (1.549 m)   Wt 94.9 kg (209 lb 3.5 oz)   SpO2 (!) 77%   BMI 39.53 kg/m   HEMODYNAMICS: CVP:  [1 mmHg-12 mmHg] 8 mmHg  VENTILATOR SETTINGS: Vent Mode: PRVC FiO2 (%):  [40 %] 40 % Set Rate:  [16 bmp] 16 bmp Vt Set:  [420 mL] 420 mL PEEP:  [5 cmH20] 5 cmH20  INTAKE / OUTPUT: I/O last 3  completed shifts: In: 4122.7 [I.V.:2710.5; Blood:358; NG/GT:604.2; IV Piggyback:450] Out: 100 [Emesis/NG output:100]  PHYSICAL EXAMINATION: General: very sickly appearing AA male, intubated and on mechanical ventilation Neuro: sedated , no purposeful movement, does not follow any commands HEENT:  Atraumatic, normocephalic, PERRLA,Copious oral secretion Cardiovascular:  A-V sequentially placed, no MRG noted Lungs:  Diminished throughout ,no wheezes,crackles,rhonchi noted Abdomen:  Nondistended, hypoactive bowel sounds x4, soft Musculoskeletal:  Bilateral BKA, 2+ generalized edema.Extremities: +2 pulses in upper extremities,right upper arm fistula with a positive bruits and thrill dressing dry and intact, fingertips turning blue. Skin: Large sacral decub LABS:  BMET  Recent Labs Lab 12/14/15 0400 12/14/15 0630  12/14/15 1747 12/14/15 1930 12/14/15 2305  NA 132* 133*  --  130*  --   --   K 4.4 4.7  --  5.0  --   --   CL 99* 99*  --  98*  --   --   CO2 20* 19*  --  14*  --   --  BUN 44* 45*  --  47*  --   --   CREATININE 3.58* 3.54*  --  3.79*  --   --   GLUCOSE 104* 112*  < > 200* 227* 178*  < > = values in this interval not displayed.  Electrolytes  Recent Labs Lab 12/14/15 0200 12/14/15 0400 12/14/15 0630 12/14/15 1747  CALCIUM 8.7* 9.0 9.0 9.0  MG 1.7  --  1.9 1.9  PHOS 3.8  --  3.8 4.7*    CBC  Recent Labs Lab 12/12/15 1826 12/13/15 0415 12/14/15 0400 12/14/15 1259  WBC 39.9* 34.5* 37.3*  --   HGB 7.3* 7.9* 6.4* 7.6*  HCT 23.6* 23.8* 19.8* 23.6*  PLT 95* 95* 78*  --     Coag's  Recent Labs Lab 12/17/2015 1613 12/03/2015 2055 12/12/15 0622 12/12/15 0832 12/13/15 0415 12/14/15 0400  APTT 46* 88* 62*  --  57* 58*  INR 1.15 2.87  --  1.46  --   --     Sepsis Markers  Recent Labs Lab 12/18/2015 1532  12/12/15 0447 12/12/15 0622 12/12/15 1301 12/13/15 0415 12/13/15 1025  LATICACIDVEN  --   < > 6.9*  --  3.5*  --  3.1*  PROCALCITON 1.65  --    --  5.94  --  49.80  --   < > = values in this interval not displayed.  ABG  Recent Labs Lab 12/12/15 1503 12/12/15 1830 12/14/15 1825  PHART 7.39 7.31* 7.30*  PCO2ART 35 35 28*  PO2ART 133* 129* 322*    Liver Enzymes  Recent Labs Lab 12/14/15 0200 12/14/15 0630 12/14/15 1747  ALBUMIN 1.2* 1.2* 1.2*    Cardiac Enzymes No results for input(s): TROPONINI, PROBNP in the last 168 hours.  Glucose  Recent Labs Lab 12/14/15 0831 12/14/15 0935 12/14/15 1006 12/14/15 1140 12/14/15 1232 12/14/15 1638  GLUCAP 64* 73 137* 57* 59* 202*    Imaging No results found.   STUDIES:  2-D Echo12/11>>The cavity size was normal.  The estimated ejection fraction was in the range of 55% to 65% CT Head 12/12>> No acute intracranial abnormality   CULTURES: Blood 12/9 x2>>negative>> Aerobic culture left av fistula 12/9>>negative Aerobic/Anaerobic culture decubitis ulcer 12/9>>abundant proteus mirabilis>> Sputum 12/11>>  ANTIBIOTICS: 12/9 zosyn>> 12/9Vancomycin>>  SIGNIFICANT EVENTS: 12/22/2015: Admitted with sepsis due to sacral decubitus ulcer. 12/08/2015: Severe GI bleed, bedside endoscopy and cardiac arrests post procedure 12/12/2015: Cardiac arrest vtach rhythm ACLS protocol initiated with ROSC 12/11- CRRT paused due to clot in venous line. Could not be restarted 12/12 tolerated a session of HD 12/13 becoming more awake, but not purposeful, limited HD (200cc removed) due to low BP 12/14 no improvement in neuro status, a little worse today, plan for repeat head ct, peripheral cyanosis of digits   LINES/TUBES: Right femoral central intravenous catheter 12/09>> Left femoral A-line 12/09>> Left femoral dialysis catheter 12/10>>  DISCUSSION: This is a 71 year old African-American male with end-stage renal disease on hemodialysis, severe peripheral vascular disease, status post bilateral BKA, peptic ulcer with perforation, multiple myeloma, type 2 diabetes, AAA, and  congestive heart failure, presenting with acute GI bleed, septic/hypovolemic shock, sepsis from sacral decubitus ulcer, and severe metabolic acidosis with subsequent respiratory failure. Now in possible DIC with multiorgan failure. Overall prognosis is guarded given multiple comorbidities   ASSESSMENT / PLAN:  PULMONARY A: Acute respiratory failure  P:   Continue full vent support with current settings Chest x-ray and ABG [place] Nebulized bronchodilators VAP Protocol  CARDIOVASCULAR A:  Cardiac arrest , status posts upper GI endoscopy 12/10 Vtach cardiac arrest 12/11 Hypotension secondary to shock-initially patient wasn't septic and hypovolemic shock, but there could be a component of cardiogenic shock as well Hx:  Congestive heart failure, Hypertension, AAA, and Peripheral vascular disease P:  Hemodynamic monitoring per ICU protocol Continue Norepinephrine/vasopressin Keep MAP goals >65 CVP monitoring every 4 hours Cardiology consulted appreciate input>etiology of vtach is unclear, keep K>4, Mg>2  RENAL A:   End-stage renal disease on hemodialysis P:   Nephrology consulted appreciate input Unable to continue CRRT on 12/11 due to vascular access problems per Nephrology will reassess today 12/12 to determine if pt can tolerate hemodialysis  Monitor and correct electrolytes Did not tolerate Dialysis well therefore stopped . Was only able to remove 276 ml  GASTROINTESTINAL A:   Acute GI bleed, status posts. Upper GI endoscopy-rectal bleeding has subsided Ulcerative Esophagitis P:   GI consulted appreciate input Wean protonix gtt, now protonix BID Monitor hemoglobin and hematocrit and transfuse per protocol Monitor for further rectal bleeding Discussed case with GI, okay to place NGT  HEMATOLOGIC A:   Acute blood loss anemia DIC leukocytosis P:  Trend CBC and transfuse for hemoglobin less than 7 with active bleeding Hold chemical VTE prophylaxis, SCD's for VTE  prophylaxis Monitor PT INR and APTT  Monitor for further bleeding  INFECTIOUS A:   Leukocytosis remains elevated but trending down Severe sepsis from infected decubitus ulcer  Questionable intra-abdominal infection P:   Continue abx as listed above Monitor fever curve Follow cultures Wound dressing as per wound nurse Surgery debrided the sacral ulcer 12/12>large amount of foul necrotic tissue removed, continue with Dakin's dresssing  ENDOCRINE A:   Hypoglycemia-resolved  Hx: Type II diabetes  P:   CBG's q4hrs   NEUROLOGIC A:   Acute metabolic encephalopathy secondary to sepsis P:   RASS goal: 0 to -1 Fentanyl and Versed for sedation. CT head Negative 12/12 WUA daily Frequent reorientation Promote family presence at bedside   Bincy Varughese,AG-ACNP Pulmonary and [place]  STAFF NOTE: I, Dr. Vilinda Boehringer have personally reviewed patient's available data, including medical history, events of note, physical examination and test results as part of my evaluation. I have discussed with NP Varughese  and other care providers such as pharmacist, RN and RRT.    S: no signifcant improvement, fevers yesterday, neuro exam depressed (only with reflexive eye movements), limited HD due to low BP issues.  Sedation off for 2 days now. Pressures could not tolerated HD yesterday.  Now clinical course complicated with peripheral cyanosis and early sloughing (necrosis) of digit (Right hand little finger); BPs difficult to obtain 2/2 critical illness, vasculopath, and sepsis.   A: Cardiac arrest , status posts upper GI endoscopy 12/10 Vtach cardiac arrest 12/11 Hypotension secondary to shock-initially patient wasn't septic and hypovolemic shock, but there could be a component of cardiogenic shock as well Hx: Congestive heart failure, Hypertension, AAA, and Peripheral vascular disease Acute GI bleed, status posts. Upper GI endoscopy-rectal bleeding has  subsided Ulcerative Esophagitis Acute blood loss anemia Renal Failure requiring dialysis DIC Leukocytosis remains elevated Severe sepsis from infected decubitus ulcer  Questionable intra-abdominal infection Hypoglycemia-resolved  Hx: Type IIdiabetes   P:  Overall today with worsening clinical course of the past 24 hours - no neuro improvement, worsening hypotension, limited HD due to low BP, now with peripheral cyanosis and early sloughing of digits (R little finger).  Will check another CT head today (48 hrs  since last one). Neuro consult.  Septic source is most likely the sacral decubitus (unstageable), however with surgical debridement along with antibiotics. Appreciate assistance from Surgery, Nephrology .  Today not stable for transfer. Check CXR and KUB Cont with TF  Recheck DIC labs Poor clinical prognosis and critically ill.    Marland Kitchen  Rest per NP/medical resident whose note is outlined above and that I agree with  The patient is critically ill with multiple organ systems failure and requires high complexity decision making for assessment and support, frequent evaluation and titration of therapies, application of advanced monitoring technologies and extensive interpretation of multiple databases.   Critical Care Time devoted to patient care services described in this note is  40 Minutes.   This time reflects time of care of this signee Dr Vilinda Boehringer.  This critical care time does not reflect procedure time, or teaching time or supervisory time of PA/NP/Med-student/Med Resident etc but could involve care discussion time.  Vilinda Boehringer, MD [place] [facility] Pager [phone] (please enter 7-digits) On Call Pager [phone] (please enter 7-digits)  Note: This note was prepared with Dragon dictation along with smaller phrase technology. Any transcriptional errors that result from this process are unintentional.

## 2016-01-02 NOTE — ED Provider Notes (Signed)
[facility]  Department of Emergency Medicine   Code Blue CONSULT NOTE  Chief Complaint: Cardiac arrest/unresponsive   Level V Caveat: Unresponsive  History of present illness: I was contacted by the hospital for a CODE BLUE cardiac arrest upstairs and presented to the patient's bedside. Two days ago I had responded to a Code Blue for the same patient. Today the patient went into PEA. Has not been able to undergo dialysis. Last potassium 6.4.  ROS: Unable to obtain, Level V caveat  Scheduled Meds: . sodium chloride   Intravenous Once  . albuterol      . brimonidine  1 drop Left Eye TID  .  ceFAZolin (ANCEF) IV  1 g Intravenous Q12H  . chlorhexidine gluconate (MEDLINE KIT)  15 mL Mouth Rinse BID  . epoetin alfa  20,000 Units Intravenous Q Thu  . feeding supplement (VITAL HIGH PROTEIN)  1,000 mL Per Tube Q24H  . hydrocortisone sod succinate (SOLU-CORTEF) inj  50 mg Intravenous Q6H  . insulin aspart  0-15 Units Subcutaneous Q4H  . linezolid (ZYVOX) IV  600 mg Intravenous Q12H  . mouth rinse  15 mL Mouth Rinse 10 times per day  . pantoprazole  40 mg Intravenous Q12H  . prednisoLONE acetate  2 drop Both Eyes BID  . sodium bicarbonate      . sodium bicarbonate      . sodium bicarbonate  100 mEq Intravenous Once  . sodium bicarbonate  50 mEq Intravenous Once  . sodium chloride flush  10-40 mL Intracatheter Q12H  . sodium hypochlorite   Irrigation BID   Continuous Infusions: . amiodarone 30 mg/hr (12-30-2015 1800)  . epinephrine    . fentaNYL infusion INTRAVENOUS Stopped (12/13/15 0730)  . norepinephrine (LEVOPHED) Adult infusion 75 mcg/min (12/30/2015 1853)  . phenylephrine (NEO-SYNEPHRINE) Adult infusion 400 mcg/min (12/30/15 1852)  .  sodium bicarbonate 150 mEq in sterile water 1000 mL infusion 50 mL/hr at 12/30/2015 1916  . vasopressin (PITRESSIN) infusion - *FOR SHOCK* 0.04 Units/min (12/30/2015 1800)   PRN Meds:.acetaminophen **OR** acetaminophen, bisacodyl, fentaNYL,  heparin, ipratropium-albuterol, midazolam, midazolam, nitroGLYCERIN, [DISCONTINUED] ondansetron **OR** ondansetron (ZOFRAN) IV, polyvinyl alcohol, sodium chloride, sodium chloride flush Past Medical History:  Diagnosis Date  . AAA (abdominal aortic aneurysm) ([place])   . CHF (congestive heart failure) ([place])   . Diabetes mellitus without complication ([place])   . Dialysis patient ([place])   . Hyperparathyroidism ([place])   . Hypertension   . Multiple myeloma ([place])   . Peptic ulcer with perforation ([place])   . PVD (peripheral vascular disease) ([place])    Past Surgical History:  Procedure Laterality Date  . AV FISTULA PLACEMENT    . BELOW KNEE LEG AMPUTATION Bilateral   . bilateral below knee amputation    . ESOPHAGOGASTRODUODENOSCOPY (EGD) WITH PROPOFOL N/A 12/22/2015   Procedure: ESOPHAGOGASTRODUODENOSCOPY (EGD) WITH PROPOFOL;  Surgeon: Wilford Corner, MD;  Location: [facility] ENDOSCOPY;  Service: Endoscopy;  Laterality: N/A;  . PACEMAKER PLACEMENT     Social History   Social History  . Marital status: Unknown    Spouse name: N/A  . Number of children: N/A  . Years of education: N/A   Occupational History  . retired    Social History Main Topics  . Smoking status: Former Research scientist (life sciences)  . Smokeless tobacco: Never Used  . Alcohol use Yes  . Drug use: No  . Sexual activity: Not on file   Other Topics Concern  . Not on file   Social History Narrative  .  No narrative on file   Allergies  Allergen Reactions  . Ivp Dye [Iodinated Diagnostic Agents]     Last set of Vital Signs (not current) Vitals:   Dec 29, 2015 1846 29-Dec-2015 1900  BP:    Pulse:    Resp: 16 13  Temp:        Physical Exam  Gen: unresponsive Cardiovascular: pulseless  Resp: intubated. Abd: nondistended  Neuro: GCS 3, unresponsive to pain   Procedures   CRITICAL CARE Performed by: Nance Pear Total critical care time: 20 Critical care time was exclusive of separately billable procedures and treating other  patients. Critical care was necessary to treat or prevent imminent or life-threatening deterioration. Critical care was time spent personally by me on the following activities: development of treatment plan with patient and/or surrogate as well as nursing, discussions with consultants, evaluation of patient's response to treatment, examination of patient, obtaining history from patient or surrogate, ordering and performing treatments and interventions, ordering and review of laboratory studies, ordering and review of radiographic studies, pulse oximetry and re-evaluation of patient's condition.  Cardiopulmonary Resuscitation (CPR) Procedure Note  Directed/Performed by: Nance Pear I personally directed ancillary staff and/or performed CPR in an effort to regain return of spontaneous circulation and to maintain cardiac, neuro and systemic perfusion.    Medical Decision making  Called to patients room for code blue. Please see nursing documentation for drugs given and timing. Given that recent potassium was high and pH was low he was given multiple rounds of epinephrine with calcium and sodium chloride. Pulses were regained.    Nance Pear, MD 12/29/2015 2103

## 2016-01-02 NOTE — Progress Notes (Signed)
Cannot get SpO2 reading, tried multiple sources and sites. Also attempts from respiratory. Will continue to try to get reading on monitor. NP aware.

## 2016-01-02 NOTE — Progress Notes (Addendum)
Patient's blood pressure stable with MAP in 60's currently. Patient was started on neo earlier today and dose was increased to 400 mcg after unsuccessful attempts to sustain blood pressure. Levophed has been increased to 70 mcg per order from [place], NP. Patient is also currently in trendelenburg. Unable to sit patient up without MAP decreasing to 40's. At this time patient is no longer responsive to pain, MD and NP aware. At this time wound dressing is supposed to be changed, this nurse is not comfortable with turning and changing dressing d/t patient's decline in condition, will continue to assess situation. Wilnette Kales

## 2016-01-02 NOTE — Progress Notes (Signed)
LA elevated, Hinton Dyer, NP notified Wilnette Kales

## 2016-01-02 NOTE — Progress Notes (Signed)
Chaplain On-Call received a code blue page to [place]. After sometime of CPR, the medical team said that they felt a pulse from the patient. After that announcement, everyone left the room. In less than ten minutes, Chaplain received another page from the ICU. When Chaplain called the ICU, he was told that the patient in room 15 passed away and his family was in the family room. Chaplain provided grief support and prayers to the family for about an hour and half.

## 2016-01-02 NOTE — Progress Notes (Signed)
Spoke with pts daughter Jorje Cape and updated her about decline in pts condition.  Informed her unable to place dialysis catheter, therefore pt cannot receive dialysis at this time and he is too unstable to tolerate hemodialysis.  Also informed Mrs. [place] pt is requiring three different vasopressors to keep blood pressure within normal limits and he is currently unresponsive despite discontinuation of sedation on 12/12.  [facility]. Will continue to monitor and assess pt.  Marda Stalker, [place] Pager [phone] (please enter 7 digits) PCCM Consult Pager [phone] (please enter 7 digits)

## 2016-01-02 NOTE — Progress Notes (Addendum)
Hinton Dyer, NP notified of glucose result. Patient's HR dropped to 40's and BP MAP dropped to 40's. Hinton Dyer, NP present on floor. Patient given several doses of bicarb and one amp of atropine. Verbal order received to place order for epinephrine gtt from NP. Patient never lost pulse, report given to night shift nurse Eustaquio Maize, Therapist, sports. Wilnette Kales

## 2016-01-02 DEATH — deceased

## 2018-06-04 IMAGING — DX DG CHEST 1V PORT
1 series · 1 of 1 positions shown · non-contrast
Comparison: None.

CLINICAL DATA: Shortness of breath and low oxygen saturation.

EXAM:
PORTABLE CHEST 1 VIEW

[chest ap]
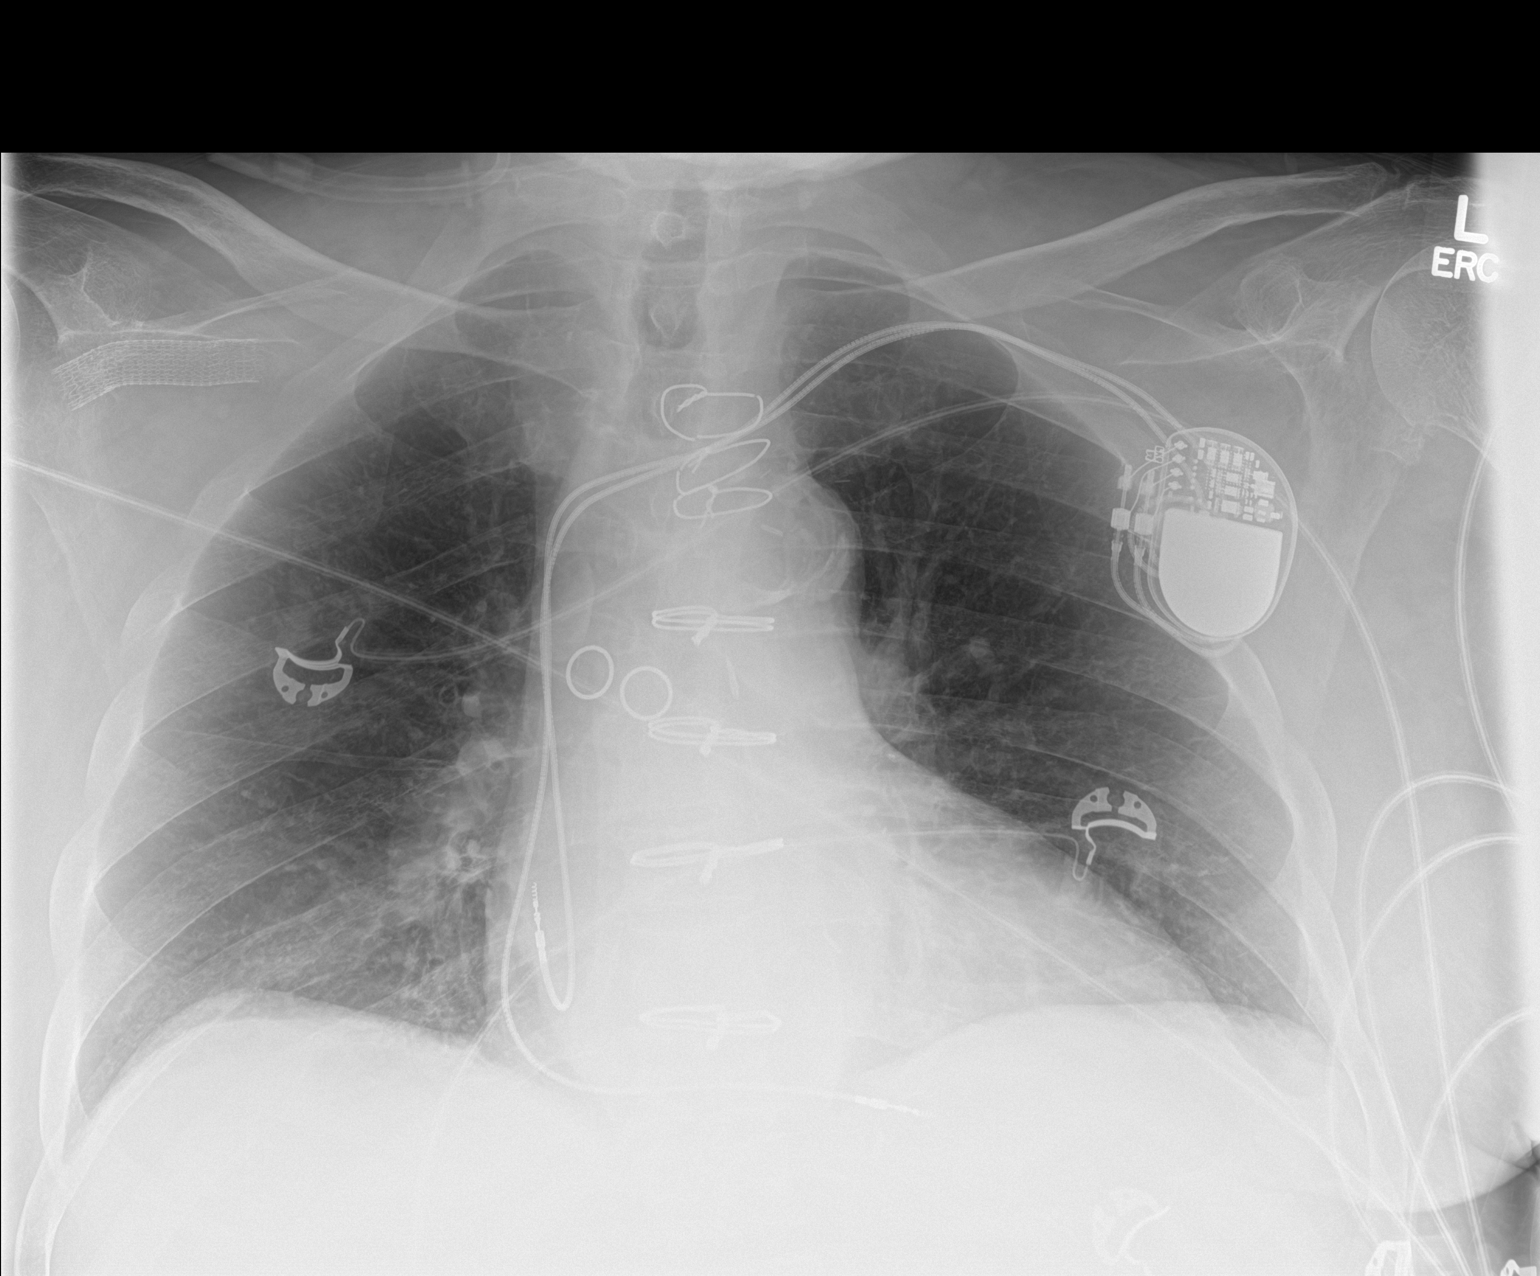

[1 of 1 positions shown; findings below may reference images not displayed]

FINDINGS: Postoperative changes in the mediastinum. Cardiac pacemaker. Shallow
inspiration. Mild cardiac enlargement without vascular congestion.
No edema or consolidation in the lungs. No blunting of costophrenic
angles. No pneumothorax. Calcification of the aorta. Vascular stent
in the subclavian region on the right.
IMPRESSION: Shallow inspiration.  No evidence of active pulmonary disease.
